# Patient Record
Sex: Male | Born: 1935 | Race: White | Hispanic: No | Marital: Married | Smoking: Former smoker
Health system: Southern US, Community
[De-identification: ages and names within clinical notes are randomized; demographics above are authoritative.]

## PROBLEM LIST (undated history)

## (undated) DIAGNOSIS — Z7901 Long term (current) use of anticoagulants: Secondary | ICD-10-CM

## (undated) DIAGNOSIS — Z86010 Personal history of colonic polyps: Secondary | ICD-10-CM

## (undated) DIAGNOSIS — Z8679 Personal history of other diseases of the circulatory system: Secondary | ICD-10-CM

## (undated) DIAGNOSIS — J322 Chronic ethmoidal sinusitis: Secondary | ICD-10-CM

## (undated) DIAGNOSIS — I739 Peripheral vascular disease, unspecified: Secondary | ICD-10-CM

## (undated) DIAGNOSIS — K449 Diaphragmatic hernia without obstruction or gangrene: Secondary | ICD-10-CM

## (undated) DIAGNOSIS — M1990 Unspecified osteoarthritis, unspecified site: Secondary | ICD-10-CM

## (undated) DIAGNOSIS — N529 Male erectile dysfunction, unspecified: Secondary | ICD-10-CM

## (undated) DIAGNOSIS — I10 Essential (primary) hypertension: Secondary | ICD-10-CM

## (undated) DIAGNOSIS — I639 Cerebral infarction, unspecified: Secondary | ICD-10-CM

## (undated) DIAGNOSIS — T7840XA Allergy, unspecified, initial encounter: Secondary | ICD-10-CM

## (undated) DIAGNOSIS — D689 Coagulation defect, unspecified: Secondary | ICD-10-CM

## (undated) DIAGNOSIS — K219 Gastro-esophageal reflux disease without esophagitis: Secondary | ICD-10-CM

## (undated) DIAGNOSIS — G459 Transient cerebral ischemic attack, unspecified: Secondary | ICD-10-CM

## (undated) DIAGNOSIS — D759 Disease of blood and blood-forming organs, unspecified: Secondary | ICD-10-CM

## (undated) DIAGNOSIS — E785 Hyperlipidemia, unspecified: Secondary | ICD-10-CM

## (undated) DIAGNOSIS — N400 Enlarged prostate without lower urinary tract symptoms: Secondary | ICD-10-CM

## (undated) DIAGNOSIS — J320 Chronic maxillary sinusitis: Secondary | ICD-10-CM

## (undated) DIAGNOSIS — J342 Deviated nasal septum: Secondary | ICD-10-CM

## (undated) DIAGNOSIS — N200 Calculus of kidney: Secondary | ICD-10-CM

## (undated) DIAGNOSIS — D6862 Lupus anticoagulant syndrome: Secondary | ICD-10-CM

## (undated) HISTORY — DX: Deviated nasal septum: J34.2

## (undated) HISTORY — DX: Disease of blood and blood-forming organs, unspecified: D75.9

## (undated) HISTORY — DX: Hyperlipidemia, unspecified: E78.5

## (undated) HISTORY — DX: Allergy, unspecified, initial encounter: T78.40XA

## (undated) HISTORY — DX: Male erectile dysfunction, unspecified: N52.9

## (undated) HISTORY — PX: LITHOTRIPSY: SUR834

## (undated) HISTORY — DX: Unspecified osteoarthritis, unspecified site: M19.90

## (undated) HISTORY — DX: Peripheral vascular disease, unspecified: I73.9

## (undated) HISTORY — DX: Cerebral infarction, unspecified: I63.9

## (undated) HISTORY — PX: NASAL SINUS SURGERY: SHX719

## (undated) HISTORY — DX: Calculus of kidney: N20.0

## (undated) HISTORY — DX: Chronic maxillary sinusitis: J32.0

## (undated) HISTORY — PX: COLONOSCOPY: SHX174

## (undated) HISTORY — DX: Lupus anticoagulant syndrome: D68.62

## (undated) HISTORY — DX: Coagulation defect, unspecified: D68.9

## (undated) HISTORY — DX: Gastro-esophageal reflux disease without esophagitis: K21.9

## (undated) HISTORY — DX: Transient cerebral ischemic attack, unspecified: G45.9

## (undated) HISTORY — DX: Personal history of colonic polyps: Z86.010

## (undated) HISTORY — PX: ROTATOR CUFF REPAIR: SHX139

## (undated) HISTORY — DX: Long term (current) use of anticoagulants: Z79.01

## (undated) HISTORY — DX: Benign prostatic hyperplasia without lower urinary tract symptoms: N40.0

## (undated) HISTORY — DX: Diaphragmatic hernia without obstruction or gangrene: K44.9

## (undated) HISTORY — DX: Chronic ethmoidal sinusitis: J32.2

## (undated) HISTORY — PX: OTHER SURGICAL HISTORY: SHX169

## (undated) HISTORY — DX: Personal history of other diseases of the circulatory system: Z86.79

## (undated) HISTORY — DX: Essential (primary) hypertension: I10

---

## 1978-08-28 HISTORY — PX: LUNG BIOPSY: SHX232

## 1999-01-19 ENCOUNTER — Ambulatory Visit: Admission: RE | Admit: 1999-01-19 | Discharge: 1999-01-19 | Payer: Self-pay | Admitting: Urology

## 1999-01-27 ENCOUNTER — Encounter: Payer: Self-pay | Admitting: Urology

## 1999-01-27 ENCOUNTER — Ambulatory Visit (HOSPITAL_COMMUNITY): Admission: RE | Admit: 1999-01-27 | Discharge: 1999-01-27 | Payer: Self-pay | Admitting: Urology

## 2000-10-14 ENCOUNTER — Encounter: Payer: Self-pay | Admitting: Emergency Medicine

## 2000-10-14 ENCOUNTER — Emergency Department (HOSPITAL_COMMUNITY): Admission: EM | Admit: 2000-10-14 | Discharge: 2000-10-14 | Payer: Self-pay | Admitting: Emergency Medicine

## 2000-10-18 ENCOUNTER — Ambulatory Visit (HOSPITAL_COMMUNITY): Admission: RE | Admit: 2000-10-18 | Discharge: 2000-10-18 | Payer: Self-pay | Admitting: Urology

## 2000-10-18 ENCOUNTER — Encounter: Payer: Self-pay | Admitting: Urology

## 2001-02-12 ENCOUNTER — Encounter (INDEPENDENT_AMBULATORY_CARE_PROVIDER_SITE_OTHER): Payer: Self-pay

## 2001-02-12 ENCOUNTER — Other Ambulatory Visit: Admission: RE | Admit: 2001-02-12 | Discharge: 2001-02-12 | Payer: Self-pay | Admitting: Internal Medicine

## 2001-05-09 ENCOUNTER — Emergency Department (HOSPITAL_COMMUNITY): Admission: EM | Admit: 2001-05-09 | Discharge: 2001-05-09 | Payer: Self-pay | Admitting: Emergency Medicine

## 2001-05-09 ENCOUNTER — Ambulatory Visit (HOSPITAL_COMMUNITY): Admission: RE | Admit: 2001-05-09 | Discharge: 2001-05-09 | Payer: Self-pay | Admitting: Orthopedic Surgery

## 2001-09-27 ENCOUNTER — Encounter: Payer: Self-pay | Admitting: Internal Medicine

## 2001-09-27 ENCOUNTER — Encounter: Admission: RE | Admit: 2001-09-27 | Discharge: 2001-09-27 | Payer: Self-pay | Admitting: Internal Medicine

## 2001-10-15 ENCOUNTER — Emergency Department (HOSPITAL_COMMUNITY): Admission: EM | Admit: 2001-10-15 | Discharge: 2001-10-15 | Payer: Self-pay

## 2001-10-15 ENCOUNTER — Encounter: Payer: Self-pay | Admitting: Internal Medicine

## 2001-10-16 ENCOUNTER — Encounter: Payer: Self-pay | Admitting: Internal Medicine

## 2001-10-16 ENCOUNTER — Encounter: Admission: RE | Admit: 2001-10-16 | Discharge: 2001-10-16 | Payer: Self-pay | Admitting: Internal Medicine

## 2002-01-27 ENCOUNTER — Ambulatory Visit (HOSPITAL_BASED_OUTPATIENT_CLINIC_OR_DEPARTMENT_OTHER): Admission: RE | Admit: 2002-01-27 | Discharge: 2002-01-27 | Payer: Self-pay | Admitting: Otolaryngology

## 2002-01-27 ENCOUNTER — Encounter (INDEPENDENT_AMBULATORY_CARE_PROVIDER_SITE_OTHER): Payer: Self-pay | Admitting: *Deleted

## 2002-06-04 ENCOUNTER — Ambulatory Visit (HOSPITAL_COMMUNITY): Admission: RE | Admit: 2002-06-04 | Discharge: 2002-06-04 | Payer: Self-pay | Admitting: Internal Medicine

## 2002-06-04 ENCOUNTER — Encounter: Payer: Self-pay | Admitting: Internal Medicine

## 2003-06-15 ENCOUNTER — Inpatient Hospital Stay (HOSPITAL_COMMUNITY): Admission: EM | Admit: 2003-06-15 | Discharge: 2003-06-19 | Payer: Self-pay | Admitting: Emergency Medicine

## 2003-06-15 ENCOUNTER — Encounter: Payer: Self-pay | Admitting: Emergency Medicine

## 2003-06-15 ENCOUNTER — Encounter: Payer: Self-pay | Admitting: Internal Medicine

## 2003-06-16 ENCOUNTER — Encounter: Payer: Self-pay | Admitting: Cardiology

## 2003-07-28 ENCOUNTER — Ambulatory Visit (HOSPITAL_COMMUNITY): Admission: RE | Admit: 2003-07-28 | Discharge: 2003-07-28 | Payer: Self-pay | Admitting: Cardiovascular Disease

## 2004-02-27 ENCOUNTER — Encounter: Admission: RE | Admit: 2004-02-27 | Discharge: 2004-02-27 | Payer: Self-pay | Admitting: Internal Medicine

## 2004-07-15 ENCOUNTER — Ambulatory Visit: Payer: Self-pay | Admitting: Internal Medicine

## 2004-08-08 ENCOUNTER — Ambulatory Visit: Payer: Self-pay | Admitting: Internal Medicine

## 2004-08-26 ENCOUNTER — Ambulatory Visit: Payer: Self-pay | Admitting: Internal Medicine

## 2004-08-30 ENCOUNTER — Ambulatory Visit: Payer: Self-pay | Admitting: Internal Medicine

## 2004-09-09 ENCOUNTER — Ambulatory Visit: Payer: Self-pay | Admitting: Internal Medicine

## 2004-09-09 ENCOUNTER — Ambulatory Visit: Payer: Self-pay | Admitting: Cardiovascular Disease

## 2004-10-03 ENCOUNTER — Ambulatory Visit: Payer: Self-pay | Admitting: Internal Medicine

## 2004-10-07 ENCOUNTER — Ambulatory Visit: Payer: Self-pay | Admitting: Cardiology

## 2004-10-14 ENCOUNTER — Ambulatory Visit: Payer: Self-pay | Admitting: Internal Medicine

## 2004-10-21 ENCOUNTER — Ambulatory Visit: Payer: Self-pay | Admitting: Cardiology

## 2004-10-31 ENCOUNTER — Ambulatory Visit: Payer: Self-pay | Admitting: Cardiology

## 2004-11-14 ENCOUNTER — Ambulatory Visit: Payer: Self-pay | Admitting: Internal Medicine

## 2004-11-28 ENCOUNTER — Ambulatory Visit: Payer: Self-pay | Admitting: Cardiology

## 2004-12-08 ENCOUNTER — Ambulatory Visit: Payer: Self-pay | Admitting: Cardiology

## 2004-12-22 ENCOUNTER — Ambulatory Visit: Payer: Self-pay | Admitting: Cardiology

## 2004-12-26 ENCOUNTER — Ambulatory Visit: Payer: Self-pay | Admitting: Internal Medicine

## 2005-01-05 ENCOUNTER — Ambulatory Visit: Payer: Self-pay | Admitting: Internal Medicine

## 2005-01-19 ENCOUNTER — Ambulatory Visit: Payer: Self-pay | Admitting: Cardiology

## 2005-01-27 ENCOUNTER — Ambulatory Visit: Payer: Self-pay | Admitting: Internal Medicine

## 2005-02-06 ENCOUNTER — Ambulatory Visit: Payer: Self-pay | Admitting: Internal Medicine

## 2005-02-20 ENCOUNTER — Ambulatory Visit: Payer: Self-pay | Admitting: Internal Medicine

## 2005-02-21 ENCOUNTER — Encounter: Admission: RE | Admit: 2005-02-21 | Discharge: 2005-02-21 | Payer: Self-pay | Admitting: Internal Medicine

## 2005-02-27 ENCOUNTER — Ambulatory Visit: Payer: Self-pay | Admitting: Internal Medicine

## 2005-03-06 ENCOUNTER — Ambulatory Visit: Payer: Self-pay | Admitting: Internal Medicine

## 2005-03-13 ENCOUNTER — Ambulatory Visit: Payer: Self-pay | Admitting: Internal Medicine

## 2005-03-20 ENCOUNTER — Ambulatory Visit: Payer: Self-pay | Admitting: Internal Medicine

## 2005-04-03 ENCOUNTER — Ambulatory Visit: Payer: Self-pay | Admitting: Cardiology

## 2005-04-17 ENCOUNTER — Ambulatory Visit: Payer: Self-pay | Admitting: Cardiology

## 2005-04-28 ENCOUNTER — Ambulatory Visit: Payer: Self-pay | Admitting: Cardiology

## 2005-05-15 ENCOUNTER — Ambulatory Visit: Payer: Self-pay | Admitting: Cardiology

## 2005-05-15 ENCOUNTER — Ambulatory Visit: Payer: Self-pay | Admitting: Internal Medicine

## 2005-05-29 ENCOUNTER — Ambulatory Visit: Payer: Self-pay | Admitting: Cardiology

## 2005-05-30 ENCOUNTER — Ambulatory Visit: Payer: Self-pay | Admitting: Internal Medicine

## 2005-06-12 ENCOUNTER — Ambulatory Visit: Payer: Self-pay | Admitting: Cardiology

## 2005-06-15 ENCOUNTER — Ambulatory Visit: Payer: Self-pay | Admitting: Internal Medicine

## 2005-06-26 ENCOUNTER — Ambulatory Visit: Payer: Self-pay | Admitting: Cardiology

## 2005-07-10 ENCOUNTER — Ambulatory Visit: Payer: Self-pay | Admitting: Cardiology

## 2005-07-14 ENCOUNTER — Ambulatory Visit: Payer: Self-pay | Admitting: Internal Medicine

## 2005-07-24 ENCOUNTER — Ambulatory Visit: Payer: Self-pay | Admitting: Cardiology

## 2005-07-31 ENCOUNTER — Ambulatory Visit: Payer: Self-pay | Admitting: Cardiology

## 2005-08-07 ENCOUNTER — Ambulatory Visit: Payer: Self-pay | Admitting: Cardiology

## 2005-08-14 ENCOUNTER — Ambulatory Visit: Payer: Self-pay | Admitting: Cardiology

## 2005-08-14 ENCOUNTER — Ambulatory Visit: Payer: Self-pay | Admitting: Internal Medicine

## 2005-08-22 ENCOUNTER — Ambulatory Visit: Payer: Self-pay | Admitting: Internal Medicine

## 2005-08-29 ENCOUNTER — Ambulatory Visit: Payer: Self-pay | Admitting: *Deleted

## 2005-09-11 ENCOUNTER — Ambulatory Visit: Payer: Self-pay | Admitting: Internal Medicine

## 2005-10-02 ENCOUNTER — Ambulatory Visit: Payer: Self-pay | Admitting: Cardiology

## 2005-10-23 ENCOUNTER — Ambulatory Visit: Payer: Self-pay | Admitting: Internal Medicine

## 2005-11-06 ENCOUNTER — Ambulatory Visit: Payer: Self-pay | Admitting: Internal Medicine

## 2005-11-20 ENCOUNTER — Ambulatory Visit: Payer: Self-pay | Admitting: Cardiology

## 2005-11-28 ENCOUNTER — Ambulatory Visit: Payer: Self-pay | Admitting: Internal Medicine

## 2005-12-04 ENCOUNTER — Ambulatory Visit: Payer: Self-pay | Admitting: Internal Medicine

## 2005-12-18 ENCOUNTER — Ambulatory Visit: Payer: Self-pay | Admitting: Cardiology

## 2005-12-27 ENCOUNTER — Ambulatory Visit: Payer: Self-pay | Admitting: Internal Medicine

## 2006-01-01 ENCOUNTER — Ambulatory Visit: Payer: Self-pay | Admitting: Cardiology

## 2006-01-15 ENCOUNTER — Ambulatory Visit: Payer: Self-pay | Admitting: Cardiology

## 2006-01-29 ENCOUNTER — Ambulatory Visit: Payer: Self-pay | Admitting: Internal Medicine

## 2006-02-19 ENCOUNTER — Ambulatory Visit: Payer: Self-pay | Admitting: Cardiology

## 2006-02-20 ENCOUNTER — Ambulatory Visit: Payer: Self-pay | Admitting: Internal Medicine

## 2006-03-05 ENCOUNTER — Ambulatory Visit: Payer: Self-pay | Admitting: Cardiology

## 2006-03-26 ENCOUNTER — Ambulatory Visit: Payer: Self-pay | Admitting: Cardiology

## 2006-04-02 ENCOUNTER — Ambulatory Visit: Payer: Self-pay | Admitting: Internal Medicine

## 2006-04-06 ENCOUNTER — Ambulatory Visit: Payer: Self-pay | Admitting: Internal Medicine

## 2006-04-17 ENCOUNTER — Ambulatory Visit: Payer: Self-pay | Admitting: Cardiology

## 2006-04-20 ENCOUNTER — Encounter: Payer: Self-pay | Admitting: Internal Medicine

## 2006-04-20 ENCOUNTER — Ambulatory Visit: Payer: Self-pay | Admitting: Internal Medicine

## 2006-04-20 LAB — HM COLONOSCOPY: HM Colonoscopy: ABNORMAL

## 2006-05-04 ENCOUNTER — Ambulatory Visit: Payer: Self-pay | Admitting: Cardiology

## 2006-05-14 ENCOUNTER — Ambulatory Visit: Payer: Self-pay | Admitting: Cardiology

## 2006-05-28 ENCOUNTER — Ambulatory Visit: Payer: Self-pay | Admitting: Internal Medicine

## 2006-06-11 ENCOUNTER — Ambulatory Visit: Payer: Self-pay | Admitting: Cardiology

## 2006-06-15 ENCOUNTER — Ambulatory Visit: Payer: Self-pay | Admitting: Internal Medicine

## 2006-07-02 ENCOUNTER — Ambulatory Visit: Payer: Self-pay | Admitting: Internal Medicine

## 2006-07-09 ENCOUNTER — Ambulatory Visit: Payer: Self-pay | Admitting: Cardiology

## 2006-07-12 ENCOUNTER — Ambulatory Visit: Payer: Self-pay | Admitting: Internal Medicine

## 2006-07-23 ENCOUNTER — Ambulatory Visit: Payer: Self-pay | Admitting: Cardiology

## 2006-08-13 ENCOUNTER — Ambulatory Visit: Payer: Self-pay | Admitting: Cardiology

## 2006-09-03 ENCOUNTER — Ambulatory Visit: Payer: Self-pay | Admitting: Cardiology

## 2006-09-06 ENCOUNTER — Ambulatory Visit: Payer: Self-pay | Admitting: Internal Medicine

## 2006-09-17 ENCOUNTER — Ambulatory Visit: Payer: Self-pay | Admitting: Cardiology

## 2006-09-21 ENCOUNTER — Ambulatory Visit: Payer: Self-pay | Admitting: Internal Medicine

## 2006-10-01 ENCOUNTER — Ambulatory Visit: Payer: Self-pay | Admitting: Internal Medicine

## 2006-10-08 ENCOUNTER — Ambulatory Visit: Payer: Self-pay | Admitting: Cardiology

## 2006-10-12 ENCOUNTER — Ambulatory Visit: Payer: Self-pay | Admitting: Internal Medicine

## 2006-10-29 ENCOUNTER — Ambulatory Visit: Payer: Self-pay | Admitting: Cardiology

## 2006-11-19 ENCOUNTER — Ambulatory Visit: Payer: Self-pay | Admitting: Internal Medicine

## 2006-12-03 ENCOUNTER — Ambulatory Visit: Payer: Self-pay | Admitting: Internal Medicine

## 2006-12-03 LAB — CONVERTED CEMR LAB
Alkaline Phosphatase: 75 units/L (ref 39–117)
Basophils Absolute: 0 10*3/uL (ref 0.0–0.1)
Basophils Relative: 0.8 % (ref 0.0–1.0)
Bilirubin, Direct: 0.1 mg/dL (ref 0.0–0.3)
Chloride: 109 meq/L (ref 96–112)
Eosinophils Absolute: 0.2 10*3/uL (ref 0.0–0.6)
Eosinophils Relative: 3.4 % (ref 0.0–5.0)
GFR calc Af Amer: 77 mL/min
GFR calc non Af Amer: 64 mL/min
Hemoglobin: 14 g/dL (ref 13.0–17.0)
Monocytes Absolute: 0.4 10*3/uL (ref 0.2–0.7)
Neutrophils Relative %: 72.8 % (ref 43.0–77.0)
Nitrite: NEGATIVE
PSA: 1.49 ng/mL (ref 0.10–4.00)
Platelets: 122 10*3/uL — ABNORMAL LOW (ref 150–400)
RBC: 4.67 M/uL (ref 4.22–5.81)
RDW: 14 % (ref 11.5–14.6)
TSH: 1.97 microintl units/mL (ref 0.35–5.50)
Total CHOL/HDL Ratio: 2.9
Urobilinogen, UA: 0.2 (ref 0.0–1.0)
VLDL: 11 mg/dL (ref 0–40)
Vit D, 1,25-Dihydroxy: 19 — ABNORMAL LOW (ref 20–57)
pH: 6 (ref 5.0–8.0)

## 2006-12-10 ENCOUNTER — Ambulatory Visit: Payer: Self-pay | Admitting: Cardiology

## 2007-01-07 ENCOUNTER — Ambulatory Visit: Payer: Self-pay | Admitting: Internal Medicine

## 2007-02-01 ENCOUNTER — Ambulatory Visit: Payer: Self-pay | Admitting: Cardiology

## 2007-02-08 ENCOUNTER — Ambulatory Visit: Payer: Self-pay | Admitting: Endocrinology

## 2007-02-15 ENCOUNTER — Ambulatory Visit: Payer: Self-pay | Admitting: Cardiology

## 2007-02-28 ENCOUNTER — Ambulatory Visit: Payer: Self-pay | Admitting: Internal Medicine

## 2007-03-15 ENCOUNTER — Ambulatory Visit: Payer: Self-pay | Admitting: Cardiovascular Disease

## 2007-03-15 DIAGNOSIS — M1990 Unspecified osteoarthritis, unspecified site: Secondary | ICD-10-CM | POA: Insufficient documentation

## 2007-03-15 DIAGNOSIS — D6859 Other primary thrombophilia: Secondary | ICD-10-CM | POA: Insufficient documentation

## 2007-03-15 DIAGNOSIS — K219 Gastro-esophageal reflux disease without esophagitis: Secondary | ICD-10-CM

## 2007-03-15 DIAGNOSIS — I10 Essential (primary) hypertension: Secondary | ICD-10-CM

## 2007-03-15 DIAGNOSIS — R0602 Shortness of breath: Secondary | ICD-10-CM

## 2007-03-15 HISTORY — DX: Essential (primary) hypertension: I10

## 2007-03-15 HISTORY — DX: Gastro-esophageal reflux disease without esophagitis: K21.9

## 2007-03-15 HISTORY — DX: Unspecified osteoarthritis, unspecified site: M19.90

## 2007-03-22 ENCOUNTER — Ambulatory Visit: Payer: Self-pay | Admitting: Internal Medicine

## 2007-03-22 LAB — CONVERTED CEMR LAB
ALT: 13 units/L (ref 0–53)
AST: 21 units/L (ref 0–37)
Albumin: 3.6 g/dL (ref 3.5–5.2)
Alkaline Phosphatase: 71 units/L (ref 39–117)
Basophils Absolute: 0 10*3/uL (ref 0.0–0.1)
Bilirubin, Direct: 0.1 mg/dL (ref 0.0–0.3)
Calcium: 8.9 mg/dL (ref 8.4–10.5)
GFR calc Af Amer: 77 mL/min
HCT: 36.9 % — ABNORMAL LOW (ref 39.0–52.0)
Hemoglobin: 12.8 g/dL — ABNORMAL LOW (ref 13.0–17.0)
Lymphocytes Relative: 24.9 % (ref 12.0–46.0)
Monocytes Relative: 7.6 % (ref 3.0–11.0)
Neutro Abs: 3.5 10*3/uL (ref 1.4–7.7)
Nitrite: NEGATIVE
Platelets: 117 10*3/uL — ABNORMAL LOW (ref 150–400)
Potassium: 4 meq/L (ref 3.5–5.1)
RBC: 4.13 M/uL — ABNORMAL LOW (ref 4.22–5.81)
RDW: 14 % (ref 11.5–14.6)
Sed Rate: 28 mm/hr — ABNORMAL HIGH (ref 0–20)
Sodium: 142 meq/L (ref 135–145)
Total Protein, Urine: NEGATIVE mg/dL
Total Protein: 6.6 g/dL (ref 6.0–8.3)
pH: 6 (ref 5.0–8.0)

## 2007-03-29 ENCOUNTER — Ambulatory Visit: Payer: Self-pay | Admitting: Internal Medicine

## 2007-03-29 ENCOUNTER — Ambulatory Visit: Payer: Self-pay | Admitting: Cardiology

## 2007-04-08 ENCOUNTER — Ambulatory Visit: Payer: Self-pay | Admitting: Cardiovascular Disease

## 2007-04-12 ENCOUNTER — Ambulatory Visit: Payer: Self-pay | Admitting: Internal Medicine

## 2007-04-25 ENCOUNTER — Ambulatory Visit: Payer: Self-pay | Admitting: Cardiology

## 2007-05-01 ENCOUNTER — Ambulatory Visit: Payer: Self-pay | Admitting: Internal Medicine

## 2007-05-13 ENCOUNTER — Ambulatory Visit: Payer: Self-pay | Admitting: Internal Medicine

## 2007-05-17 ENCOUNTER — Ambulatory Visit: Payer: Self-pay | Admitting: Cardiovascular Disease

## 2007-05-17 DIAGNOSIS — Z8679 Personal history of other diseases of the circulatory system: Secondary | ICD-10-CM

## 2007-05-17 HISTORY — DX: Personal history of other diseases of the circulatory system: Z86.79

## 2007-05-27 ENCOUNTER — Ambulatory Visit: Payer: Self-pay | Admitting: Internal Medicine

## 2007-05-31 ENCOUNTER — Ambulatory Visit: Payer: Self-pay

## 2007-06-07 ENCOUNTER — Ambulatory Visit: Payer: Self-pay | Admitting: Internal Medicine

## 2007-07-05 ENCOUNTER — Ambulatory Visit: Payer: Self-pay | Admitting: Cardiology

## 2007-07-23 ENCOUNTER — Ambulatory Visit: Payer: Self-pay | Admitting: Internal Medicine

## 2007-08-12 ENCOUNTER — Ambulatory Visit: Payer: Self-pay | Admitting: Internal Medicine

## 2007-08-12 ENCOUNTER — Ambulatory Visit: Payer: Self-pay | Admitting: Cardiovascular Disease

## 2007-08-12 DIAGNOSIS — M545 Low back pain, unspecified: Secondary | ICD-10-CM | POA: Insufficient documentation

## 2007-08-12 DIAGNOSIS — L570 Actinic keratosis: Secondary | ICD-10-CM

## 2007-08-12 DIAGNOSIS — N400 Enlarged prostate without lower urinary tract symptoms: Secondary | ICD-10-CM

## 2007-08-12 DIAGNOSIS — M79609 Pain in unspecified limb: Secondary | ICD-10-CM

## 2007-08-12 HISTORY — DX: Benign prostatic hyperplasia without lower urinary tract symptoms: N40.0

## 2007-08-27 ENCOUNTER — Ambulatory Visit: Payer: Self-pay | Admitting: Cardiology

## 2007-09-09 ENCOUNTER — Ambulatory Visit: Payer: Self-pay | Admitting: Cardiology

## 2007-09-23 ENCOUNTER — Ambulatory Visit: Payer: Self-pay | Admitting: Cardiology

## 2007-10-01 ENCOUNTER — Ambulatory Visit: Payer: Self-pay | Admitting: Cardiology

## 2007-10-01 ENCOUNTER — Inpatient Hospital Stay (HOSPITAL_COMMUNITY): Admission: EM | Admit: 2007-10-01 | Discharge: 2007-10-02 | Payer: Self-pay | Admitting: Emergency Medicine

## 2007-10-01 ENCOUNTER — Ambulatory Visit: Payer: Self-pay | Admitting: Internal Medicine

## 2007-10-02 ENCOUNTER — Encounter: Payer: Self-pay | Admitting: Internal Medicine

## 2007-10-03 ENCOUNTER — Ambulatory Visit: Payer: Self-pay | Admitting: Cardiology

## 2007-10-11 ENCOUNTER — Ambulatory Visit: Payer: Self-pay | Admitting: Cardiology

## 2007-10-16 ENCOUNTER — Ambulatory Visit: Payer: Self-pay | Admitting: Internal Medicine

## 2007-10-16 ENCOUNTER — Telehealth: Payer: Self-pay | Admitting: Internal Medicine

## 2007-10-16 DIAGNOSIS — I739 Peripheral vascular disease, unspecified: Secondary | ICD-10-CM

## 2007-10-16 DIAGNOSIS — IMO0001 Reserved for inherently not codable concepts without codable children: Secondary | ICD-10-CM

## 2007-10-16 DIAGNOSIS — R209 Unspecified disturbances of skin sensation: Secondary | ICD-10-CM | POA: Insufficient documentation

## 2007-10-16 HISTORY — DX: Peripheral vascular disease, unspecified: I73.9

## 2007-10-25 ENCOUNTER — Ambulatory Visit: Payer: Self-pay | Admitting: Internal Medicine

## 2007-10-25 ENCOUNTER — Ambulatory Visit: Payer: Self-pay | Admitting: Cardiology

## 2007-10-25 DIAGNOSIS — M722 Plantar fascial fibromatosis: Secondary | ICD-10-CM

## 2007-10-25 DIAGNOSIS — Z860100 Personal history of colon polyps, unspecified: Secondary | ICD-10-CM

## 2007-10-25 DIAGNOSIS — E785 Hyperlipidemia, unspecified: Secondary | ICD-10-CM

## 2007-10-25 DIAGNOSIS — K921 Melena: Secondary | ICD-10-CM

## 2007-10-25 DIAGNOSIS — J309 Allergic rhinitis, unspecified: Secondary | ICD-10-CM | POA: Insufficient documentation

## 2007-10-25 HISTORY — DX: Personal history of colon polyps, unspecified: Z86.0100

## 2007-10-25 HISTORY — DX: Hyperlipidemia, unspecified: E78.5

## 2007-10-25 HISTORY — DX: Personal history of colonic polyps: Z86.010

## 2007-10-31 ENCOUNTER — Telehealth: Payer: Self-pay | Admitting: Family Medicine

## 2007-11-01 ENCOUNTER — Ambulatory Visit: Payer: Self-pay | Admitting: Internal Medicine

## 2007-11-01 DIAGNOSIS — R51 Headache: Secondary | ICD-10-CM

## 2007-11-01 DIAGNOSIS — R519 Headache, unspecified: Secondary | ICD-10-CM | POA: Insufficient documentation

## 2007-11-01 DIAGNOSIS — R509 Fever, unspecified: Secondary | ICD-10-CM

## 2007-11-01 LAB — CONVERTED CEMR LAB
HCT: 40.1 % (ref 39.0–52.0)
Lymphocytes Relative: 14.3 % (ref 12.0–46.0)
MCHC: 32.6 g/dL (ref 30.0–36.0)
Monocytes Relative: 7.7 % (ref 3.0–11.0)
Neutro Abs: 4.5 10*3/uL (ref 1.4–7.7)
Platelets: 132 10*3/uL — ABNORMAL LOW (ref 150–400)

## 2007-11-04 ENCOUNTER — Ambulatory Visit: Payer: Self-pay | Admitting: Cardiology

## 2007-11-06 ENCOUNTER — Telehealth: Payer: Self-pay | Admitting: Internal Medicine

## 2007-11-08 ENCOUNTER — Ambulatory Visit: Payer: Self-pay | Admitting: Internal Medicine

## 2007-11-08 ENCOUNTER — Telehealth: Payer: Self-pay | Admitting: Internal Medicine

## 2007-11-08 DIAGNOSIS — R259 Unspecified abnormal involuntary movements: Secondary | ICD-10-CM | POA: Insufficient documentation

## 2007-11-11 ENCOUNTER — Ambulatory Visit: Payer: Self-pay | Admitting: Internal Medicine

## 2007-11-22 ENCOUNTER — Telehealth: Payer: Self-pay | Admitting: Internal Medicine

## 2007-11-22 DIAGNOSIS — R05 Cough: Secondary | ICD-10-CM

## 2007-11-25 ENCOUNTER — Encounter (INDEPENDENT_AMBULATORY_CARE_PROVIDER_SITE_OTHER): Payer: Self-pay | Admitting: *Deleted

## 2007-11-25 ENCOUNTER — Ambulatory Visit: Payer: Self-pay | Admitting: Cardiology

## 2007-11-25 ENCOUNTER — Telehealth: Payer: Self-pay | Admitting: Internal Medicine

## 2007-12-04 ENCOUNTER — Ambulatory Visit: Payer: Self-pay | Admitting: Internal Medicine

## 2007-12-04 DIAGNOSIS — J45909 Unspecified asthma, uncomplicated: Secondary | ICD-10-CM

## 2007-12-09 ENCOUNTER — Ambulatory Visit: Payer: Self-pay | Admitting: Internal Medicine

## 2007-12-30 ENCOUNTER — Ambulatory Visit: Payer: Self-pay | Admitting: Critical Care Medicine

## 2008-01-01 ENCOUNTER — Telehealth (INDEPENDENT_AMBULATORY_CARE_PROVIDER_SITE_OTHER): Payer: Self-pay | Admitting: *Deleted

## 2008-01-06 ENCOUNTER — Ambulatory Visit: Payer: Self-pay | Admitting: Cardiology

## 2008-01-10 ENCOUNTER — Ambulatory Visit: Payer: Self-pay | Admitting: Critical Care Medicine

## 2008-01-12 ENCOUNTER — Encounter: Payer: Self-pay | Admitting: Critical Care Medicine

## 2008-01-16 ENCOUNTER — Telehealth (INDEPENDENT_AMBULATORY_CARE_PROVIDER_SITE_OTHER): Payer: Self-pay | Admitting: *Deleted

## 2008-01-17 ENCOUNTER — Ambulatory Visit: Payer: Self-pay | Admitting: Internal Medicine

## 2008-01-22 ENCOUNTER — Telehealth: Payer: Self-pay | Admitting: Internal Medicine

## 2008-01-31 ENCOUNTER — Ambulatory Visit: Payer: Self-pay | Admitting: Critical Care Medicine

## 2008-02-03 ENCOUNTER — Ambulatory Visit: Payer: Self-pay | Admitting: Cardiology

## 2008-02-12 ENCOUNTER — Telehealth: Payer: Self-pay | Admitting: Internal Medicine

## 2008-02-18 ENCOUNTER — Ambulatory Visit: Payer: Self-pay | Admitting: Cardiology

## 2008-03-10 ENCOUNTER — Ambulatory Visit: Payer: Self-pay | Admitting: Cardiology

## 2008-03-17 ENCOUNTER — Telehealth (INDEPENDENT_AMBULATORY_CARE_PROVIDER_SITE_OTHER): Payer: Self-pay | Admitting: *Deleted

## 2008-03-17 ENCOUNTER — Emergency Department (HOSPITAL_COMMUNITY): Admission: EM | Admit: 2008-03-17 | Discharge: 2008-03-17 | Payer: Self-pay | Admitting: Emergency Medicine

## 2008-03-19 ENCOUNTER — Ambulatory Visit: Payer: Self-pay | Admitting: Internal Medicine

## 2008-03-19 DIAGNOSIS — G459 Transient cerebral ischemic attack, unspecified: Secondary | ICD-10-CM

## 2008-03-19 HISTORY — DX: Transient cerebral ischemic attack, unspecified: G45.9

## 2008-04-07 ENCOUNTER — Ambulatory Visit: Payer: Self-pay | Admitting: Internal Medicine

## 2008-04-20 ENCOUNTER — Ambulatory Visit: Payer: Self-pay | Admitting: Cardiovascular Disease

## 2008-05-05 ENCOUNTER — Ambulatory Visit: Payer: Self-pay | Admitting: Cardiology

## 2008-05-08 ENCOUNTER — Ambulatory Visit: Payer: Self-pay | Admitting: Internal Medicine

## 2008-05-08 DIAGNOSIS — R079 Chest pain, unspecified: Secondary | ICD-10-CM

## 2008-05-08 DIAGNOSIS — R232 Flushing: Secondary | ICD-10-CM

## 2008-05-11 ENCOUNTER — Ambulatory Visit: Payer: Self-pay | Admitting: Internal Medicine

## 2008-05-19 ENCOUNTER — Ambulatory Visit: Payer: Self-pay | Admitting: Cardiology

## 2008-05-26 ENCOUNTER — Ambulatory Visit: Payer: Self-pay | Admitting: Cardiology

## 2008-06-02 ENCOUNTER — Encounter: Payer: Self-pay | Admitting: Internal Medicine

## 2008-06-09 ENCOUNTER — Ambulatory Visit: Payer: Self-pay | Admitting: Cardiovascular Disease

## 2008-06-12 ENCOUNTER — Ambulatory Visit: Payer: Self-pay | Admitting: Internal Medicine

## 2008-06-15 ENCOUNTER — Ambulatory Visit: Payer: Self-pay | Admitting: Critical Care Medicine

## 2008-06-16 ENCOUNTER — Ambulatory Visit: Payer: Self-pay | Admitting: Internal Medicine

## 2008-06-17 LAB — CONVERTED CEMR LAB
ALT: 21 units/L (ref 0–53)
AST: 22 units/L (ref 0–37)
Albumin: 3.6 g/dL (ref 3.5–5.2)
Alkaline Phosphatase: 60 units/L (ref 39–117)
Basophils Absolute: 0 10*3/uL (ref 0.0–0.1)
CO2: 25 meq/L (ref 19–32)
Creatinine, Ser: 1 mg/dL (ref 0.4–1.5)
Direct LDL: 94.1 mg/dL
Eosinophils Relative: 3.4 % (ref 0.0–5.0)
HCT: 40.6 % (ref 39.0–52.0)
Lymphocytes Relative: 23 % (ref 12.0–46.0)
MCHC: 34.6 g/dL (ref 30.0–36.0)
Monocytes Absolute: 0.4 10*3/uL (ref 0.1–1.0)
Neutrophils Relative %: 66.1 % (ref 43.0–77.0)
Potassium: 4.3 meq/L (ref 3.5–5.1)
RDW: 14.7 % — ABNORMAL HIGH (ref 11.5–14.6)
Sodium: 141 meq/L (ref 135–145)
Total Bilirubin: 0.8 mg/dL (ref 0.3–1.2)
Total CHOL/HDL Ratio: 6.6
Total Protein: 6.6 g/dL (ref 6.0–8.3)
Triglycerides: 306 mg/dL (ref 0–149)
WBC: 5.9 10*3/uL (ref 4.5–10.5)

## 2008-06-22 ENCOUNTER — Ambulatory Visit: Payer: Self-pay | Admitting: Internal Medicine

## 2008-06-26 ENCOUNTER — Telehealth: Payer: Self-pay | Admitting: Internal Medicine

## 2008-06-26 ENCOUNTER — Emergency Department (HOSPITAL_COMMUNITY): Admission: EM | Admit: 2008-06-26 | Discharge: 2008-06-26 | Payer: Self-pay | Admitting: Emergency Medicine

## 2008-06-29 ENCOUNTER — Ambulatory Visit: Payer: Self-pay | Admitting: Internal Medicine

## 2008-06-29 ENCOUNTER — Ambulatory Visit: Payer: Self-pay | Admitting: Cardiology

## 2008-07-20 ENCOUNTER — Ambulatory Visit: Payer: Self-pay | Admitting: Cardiology

## 2008-08-03 ENCOUNTER — Ambulatory Visit: Payer: Self-pay | Admitting: Internal Medicine

## 2008-08-20 ENCOUNTER — Ambulatory Visit: Payer: Self-pay | Admitting: Internal Medicine

## 2008-08-31 ENCOUNTER — Ambulatory Visit: Payer: Self-pay | Admitting: Cardiology

## 2008-09-15 ENCOUNTER — Ambulatory Visit: Payer: Self-pay | Admitting: Internal Medicine

## 2008-09-15 LAB — CONVERTED CEMR LAB
AST: 23 units/L (ref 0–37)
Albumin: 3.5 g/dL (ref 3.5–5.2)
Alkaline Phosphatase: 61 units/L (ref 39–117)
Bilirubin, Direct: 0.1 mg/dL (ref 0.0–0.3)
CO2: 28 meq/L (ref 19–32)
Direct LDL: 110.6 mg/dL
GFR calc Af Amer: 107 mL/min
GFR calc non Af Amer: 89 mL/min
HDL: 28.9 mg/dL — ABNORMAL LOW (ref >39.0)
Total Bilirubin: 0.9 mg/dL (ref 0.3–1.2)
Total Protein: 6.2 g/dL (ref 6.0–8.3)
Triglycerides: 231 mg/dL (ref 0–149)
VLDL: 46 mg/dL — ABNORMAL HIGH (ref 0–40)

## 2008-09-21 ENCOUNTER — Ambulatory Visit: Payer: Self-pay | Admitting: Internal Medicine

## 2008-09-28 ENCOUNTER — Ambulatory Visit: Payer: Self-pay | Admitting: Cardiology

## 2008-10-26 ENCOUNTER — Ambulatory Visit: Payer: Self-pay | Admitting: Cardiovascular Disease

## 2008-11-18 ENCOUNTER — Ambulatory Visit: Payer: Self-pay | Admitting: Internal Medicine

## 2008-12-14 ENCOUNTER — Ambulatory Visit: Payer: Self-pay | Admitting: Cardiology

## 2008-12-15 ENCOUNTER — Ambulatory Visit: Payer: Self-pay | Admitting: Internal Medicine

## 2008-12-15 LAB — CONVERTED CEMR LAB
AST: 20 units/L (ref 0–37)
Albumin: 3.5 g/dL (ref 3.5–5.2)
BUN: 18 mg/dL (ref 6–23)
Chloride: 109 meq/L (ref 96–112)
Creatinine, Ser: 1 mg/dL (ref 0.4–1.5)
LDL Cholesterol: 124 mg/dL — ABNORMAL HIGH (ref 0–99)
Sodium: 142 meq/L (ref 135–145)
TSH: 2.24 microintl units/mL (ref 0.35–5.50)
Triglycerides: 149 mg/dL (ref 0.0–149.0)
VLDL: 29.8 mg/dL (ref 0.0–40.0)

## 2008-12-21 ENCOUNTER — Ambulatory Visit: Payer: Self-pay | Admitting: Internal Medicine

## 2008-12-21 DIAGNOSIS — K5900 Constipation, unspecified: Secondary | ICD-10-CM | POA: Insufficient documentation

## 2009-01-11 ENCOUNTER — Ambulatory Visit: Payer: Self-pay | Admitting: Internal Medicine

## 2009-01-21 ENCOUNTER — Ambulatory Visit: Payer: Self-pay | Admitting: Internal Medicine

## 2009-01-21 LAB — CONVERTED CEMR LAB: Protime: 19

## 2009-01-27 ENCOUNTER — Encounter: Payer: Self-pay | Admitting: *Deleted

## 2009-02-15 ENCOUNTER — Ambulatory Visit: Payer: Self-pay | Admitting: Internal Medicine

## 2009-02-15 LAB — CONVERTED CEMR LAB
POC INR: 2.5
Protime: 19.2

## 2009-03-03 ENCOUNTER — Encounter: Payer: Self-pay | Admitting: *Deleted

## 2009-03-15 ENCOUNTER — Ambulatory Visit: Payer: Self-pay | Admitting: Cardiovascular Disease

## 2009-03-15 LAB — CONVERTED CEMR LAB: POC INR: 3.3

## 2009-04-05 ENCOUNTER — Ambulatory Visit: Payer: Self-pay | Admitting: Internal Medicine

## 2009-04-19 ENCOUNTER — Ambulatory Visit: Payer: Self-pay | Admitting: Cardiology

## 2009-04-20 ENCOUNTER — Ambulatory Visit: Payer: Self-pay | Admitting: Internal Medicine

## 2009-04-20 LAB — CONVERTED CEMR LAB
CO2: 28 meq/L (ref 19–32)
Chloride: 109 meq/L (ref 96–112)
GFR calc non Af Amer: 78.35 mL/min (ref >60)
Potassium: 3.9 meq/L (ref 3.5–5.1)

## 2009-04-26 ENCOUNTER — Ambulatory Visit: Payer: Self-pay | Admitting: Internal Medicine

## 2009-05-10 ENCOUNTER — Ambulatory Visit: Payer: Self-pay | Admitting: Internal Medicine

## 2009-05-26 ENCOUNTER — Ambulatory Visit: Payer: Self-pay | Admitting: Internal Medicine

## 2009-05-31 ENCOUNTER — Ambulatory Visit: Payer: Self-pay | Admitting: Cardiovascular Disease

## 2009-05-31 LAB — CONVERTED CEMR LAB: POC INR: 2.4

## 2009-06-21 ENCOUNTER — Ambulatory Visit: Payer: Self-pay | Admitting: Cardiology

## 2009-06-22 ENCOUNTER — Encounter: Payer: Self-pay | Admitting: Internal Medicine

## 2009-06-30 ENCOUNTER — Telehealth: Payer: Self-pay | Admitting: Internal Medicine

## 2009-07-12 ENCOUNTER — Ambulatory Visit: Payer: Self-pay | Admitting: Internal Medicine

## 2009-07-12 LAB — CONVERTED CEMR LAB: POC INR: 2.2

## 2009-07-27 ENCOUNTER — Ambulatory Visit: Payer: Self-pay | Admitting: Internal Medicine

## 2009-07-27 DIAGNOSIS — N529 Male erectile dysfunction, unspecified: Secondary | ICD-10-CM

## 2009-07-27 DIAGNOSIS — Z87891 Personal history of nicotine dependence: Secondary | ICD-10-CM

## 2009-07-27 HISTORY — DX: Male erectile dysfunction, unspecified: N52.9

## 2009-08-02 ENCOUNTER — Ambulatory Visit: Payer: Self-pay | Admitting: Cardiology

## 2009-08-02 ENCOUNTER — Encounter (INDEPENDENT_AMBULATORY_CARE_PROVIDER_SITE_OTHER): Payer: Self-pay | Admitting: Cardiology

## 2009-08-02 LAB — CONVERTED CEMR LAB: POC INR: 2.7

## 2009-08-04 ENCOUNTER — Telehealth: Payer: Self-pay | Admitting: Internal Medicine

## 2009-08-30 ENCOUNTER — Ambulatory Visit: Payer: Self-pay | Admitting: Cardiology

## 2009-09-27 ENCOUNTER — Encounter (INDEPENDENT_AMBULATORY_CARE_PROVIDER_SITE_OTHER): Payer: Self-pay | Admitting: Cardiology

## 2009-09-27 ENCOUNTER — Ambulatory Visit: Payer: Self-pay | Admitting: Cardiology

## 2009-10-25 ENCOUNTER — Ambulatory Visit: Payer: Self-pay | Admitting: Internal Medicine

## 2009-10-25 LAB — CONVERTED CEMR LAB
ALT: 27 units/L (ref 0–53)
AST: 27 units/L (ref 0–37)
Albumin: 3.7 g/dL (ref 3.5–5.2)
Alkaline Phosphatase: 59 units/L (ref 39–117)
Chloride: 109 meq/L (ref 96–112)
Creatinine, Ser: 1 mg/dL (ref 0.4–1.5)
GFR calc non Af Amer: 78.23 mL/min (ref >60)
LDL Cholesterol: 109 mg/dL — ABNORMAL HIGH (ref 0–99)
POC INR: 2.4
Potassium: 4.2 meq/L (ref 3.5–5.1)
Total CHOL/HDL Ratio: 5
Triglycerides: 166 mg/dL — ABNORMAL HIGH (ref 0.0–149.0)
VLDL: 33.2 mg/dL (ref 0.0–40.0)

## 2009-11-01 ENCOUNTER — Ambulatory Visit: Payer: Self-pay | Admitting: Internal Medicine

## 2009-11-01 ENCOUNTER — Telehealth (INDEPENDENT_AMBULATORY_CARE_PROVIDER_SITE_OTHER): Payer: Self-pay | Admitting: *Deleted

## 2009-11-07 ENCOUNTER — Encounter: Payer: Self-pay | Admitting: Internal Medicine

## 2009-11-16 ENCOUNTER — Ambulatory Visit: Payer: Self-pay | Admitting: Cardiovascular Disease

## 2009-11-24 ENCOUNTER — Telehealth (INDEPENDENT_AMBULATORY_CARE_PROVIDER_SITE_OTHER): Payer: Self-pay | Admitting: *Deleted

## 2009-11-25 ENCOUNTER — Ambulatory Visit: Payer: Self-pay | Admitting: Cardiology

## 2009-11-25 ENCOUNTER — Encounter: Payer: Self-pay | Admitting: Cardiovascular Disease

## 2009-11-25 ENCOUNTER — Ambulatory Visit: Payer: Self-pay

## 2009-11-25 ENCOUNTER — Encounter (HOSPITAL_COMMUNITY): Admission: RE | Admit: 2009-11-25 | Discharge: 2010-01-26 | Payer: Self-pay | Admitting: Cardiovascular Disease

## 2009-11-26 ENCOUNTER — Telehealth: Payer: Self-pay | Admitting: Internal Medicine

## 2009-12-23 ENCOUNTER — Encounter: Payer: Self-pay | Admitting: Internal Medicine

## 2009-12-23 ENCOUNTER — Ambulatory Visit: Payer: Self-pay | Admitting: Cardiology

## 2009-12-23 LAB — CONVERTED CEMR LAB: POC INR: 2.6

## 2010-01-19 ENCOUNTER — Telehealth: Payer: Self-pay | Admitting: Internal Medicine

## 2010-01-20 ENCOUNTER — Ambulatory Visit: Payer: Self-pay | Admitting: Internal Medicine

## 2010-01-20 LAB — CONVERTED CEMR LAB: POC INR: 2.1

## 2010-01-25 ENCOUNTER — Encounter: Payer: Self-pay | Admitting: Internal Medicine

## 2010-02-01 ENCOUNTER — Ambulatory Visit: Payer: Self-pay | Admitting: Internal Medicine

## 2010-02-01 LAB — CONVERTED CEMR LAB
BUN: 20 mg/dL (ref 6–23)
CO2: 28 meq/L (ref 19–32)
Calcium: 8.8 mg/dL (ref 8.4–10.5)
Chloride: 108 meq/L (ref 96–112)

## 2010-02-07 ENCOUNTER — Ambulatory Visit: Payer: Self-pay | Admitting: Internal Medicine

## 2010-02-07 DIAGNOSIS — K149 Disease of tongue, unspecified: Secondary | ICD-10-CM | POA: Insufficient documentation

## 2010-02-17 ENCOUNTER — Ambulatory Visit: Payer: Self-pay | Admitting: Cardiology

## 2010-03-15 ENCOUNTER — Encounter: Payer: Self-pay | Admitting: Internal Medicine

## 2010-03-16 ENCOUNTER — Ambulatory Visit: Payer: Self-pay | Admitting: Cardiology

## 2010-04-13 ENCOUNTER — Ambulatory Visit: Payer: Self-pay | Admitting: Cardiovascular Disease

## 2010-04-26 ENCOUNTER — Ambulatory Visit: Payer: Self-pay | Admitting: Cardiology

## 2010-05-10 ENCOUNTER — Ambulatory Visit: Payer: Self-pay | Admitting: Cardiology

## 2010-05-11 ENCOUNTER — Ambulatory Visit: Payer: Self-pay | Admitting: Internal Medicine

## 2010-05-13 ENCOUNTER — Telehealth (INDEPENDENT_AMBULATORY_CARE_PROVIDER_SITE_OTHER): Payer: Self-pay | Admitting: *Deleted

## 2010-05-13 LAB — CONVERTED CEMR LAB
ALT: 27 units/L (ref 0–53)
AST: 27 units/L (ref 0–37)
Albumin: 4.1 g/dL (ref 3.5–5.2)
BUN: 24 mg/dL — ABNORMAL HIGH (ref 6–23)
Chloride: 109 meq/L (ref 96–112)
Cholesterol: 206 mg/dL — ABNORMAL HIGH (ref 0–200)
Direct LDL: 137.9 mg/dL
Eosinophils Relative: 2 % (ref 0.0–5.0)
Glucose, Bld: 94 mg/dL (ref 70–99)
HCT: 42.3 % (ref 39.0–52.0)
Hemoglobin: 14.5 g/dL (ref 13.0–17.0)
Lymphs Abs: 1.2 10*3/uL (ref 0.7–4.0)
MCV: 90.9 fL (ref 78.0–100.0)
Monocytes Absolute: 0.4 10*3/uL (ref 0.1–1.0)
Monocytes Relative: 6.2 % (ref 3.0–12.0)
Neutro Abs: 4.2 10*3/uL (ref 1.4–7.7)
Nitrite: NEGATIVE
Platelets: 127 10*3/uL — ABNORMAL LOW (ref 150.0–400.0)
Potassium: 4.6 meq/L (ref 3.5–5.1)
RDW: 14.5 % (ref 11.5–14.6)
Sodium: 143 meq/L (ref 135–145)
Specific Gravity, Urine: >=1.03 (ref 1.000–1.030)
Total Bilirubin: 1.2 mg/dL (ref 0.3–1.2)
Total CHOL/HDL Ratio: 6
Total Protein, Urine: 30 mg/dL
Total Protein: 7 g/dL (ref 6.0–8.3)
Triglycerides: 136 mg/dL (ref 0.0–149.0)
Urine Glucose: NEGATIVE mg/dL
WBC: 5.9 10*3/uL (ref 4.5–10.5)
pH: 5.5 (ref 5.0–8.0)

## 2010-05-16 ENCOUNTER — Encounter: Payer: Self-pay | Admitting: Internal Medicine

## 2010-05-18 ENCOUNTER — Telehealth (INDEPENDENT_AMBULATORY_CARE_PROVIDER_SITE_OTHER): Payer: Self-pay | Admitting: *Deleted

## 2010-05-20 ENCOUNTER — Ambulatory Visit: Payer: Self-pay | Admitting: Cardiology

## 2010-05-25 ENCOUNTER — Encounter: Payer: Self-pay | Admitting: Internal Medicine

## 2010-05-27 ENCOUNTER — Ambulatory Visit: Payer: Self-pay | Admitting: Internal Medicine

## 2010-06-10 ENCOUNTER — Ambulatory Visit: Payer: Self-pay | Admitting: Cardiology

## 2010-06-10 LAB — CONVERTED CEMR LAB: POC INR: 3.3

## 2010-07-01 ENCOUNTER — Ambulatory Visit: Payer: Self-pay | Admitting: Cardiology

## 2010-07-22 ENCOUNTER — Ambulatory Visit: Payer: Self-pay | Admitting: Cardiology

## 2010-07-26 ENCOUNTER — Telehealth: Payer: Self-pay | Admitting: Internal Medicine

## 2010-08-04 ENCOUNTER — Encounter: Payer: Self-pay | Admitting: Internal Medicine

## 2010-08-10 ENCOUNTER — Ambulatory Visit: Payer: Self-pay | Admitting: Internal Medicine

## 2010-08-15 ENCOUNTER — Encounter: Payer: Self-pay | Admitting: Physician Assistant

## 2010-08-18 LAB — CONVERTED CEMR LAB: POC INR: 2.8

## 2010-08-19 ENCOUNTER — Ambulatory Visit: Payer: Self-pay | Admitting: Cardiology

## 2010-09-08 ENCOUNTER — Ambulatory Visit
Admission: RE | Admit: 2010-09-08 | Discharge: 2010-09-08 | Payer: Self-pay | Source: Home / Self Care | Attending: Cardiology | Admitting: Cardiology

## 2010-09-15 ENCOUNTER — Ambulatory Visit: Admission: RE | Admit: 2010-09-15 | Discharge: 2010-09-15 | Payer: Self-pay | Source: Home / Self Care

## 2010-09-29 NOTE — Progress Notes (Signed)
Summary: Nuclear Pre-Procedure  Phone Note Outgoing Call Call back at Home Phone [phone]   Call placed by: Stanton Kidney, EMT-P,  November 24, 2009 3:17 PM Action Taken: Phone Call Completed Summary of Call: Reviewed information on [employer] Information Sheet (see scanned document for further details).  Spoke with Patient.    Nuclear Med Background Indications for Stress Test: Evaluation for Ischemia   History: COPD, Echo, Myocardial Perfusion Study, MVP  History Comments: '04 Echo: TEE, EF=60%, MVP, possible pulmonary stent 10/08 MPS: EF=58%  Symptoms: Chest Pain    Nuclear Pre-Procedure Cardiac Risk Factors: Carotid Disease, Family History - CAD, History of Smoking, Hypertension, Lipids, PVD, TIA Height (in): 67  Nuclear Med Study Referring MD:  Sonda Primes

## 2010-09-29 NOTE — Medication Information (Signed)
Summary: rov/mw   Anticoagulant Therapy  Managed by: Weston Brass, PharmD Referring MD: Sonda Primes PCP: Dr. Posey Rea Supervising MD: Tenny Craw MD, Gunnar Fusi Indication 1: Deep Venous Thrombosis (ICD-451.19) Indication 2: CVA-stroke (ICD-436) Lab Used: [facility] Site: [facility] INR POC 1.7 INR RANGE 2 - 3  Dietary changes: no    Health status changes: no    Bleeding/hemorrhagic complications: no    Recent/future hospitalizations: no    Any changes in medication regimen? yes       Details: finished Cipro yesterday  Recent/future dental: no  Any missed doses?: no       Is patient compliant with meds? yes       Allergies: 1)  ! Niacin (Niacin) 2)  ! * Ivp Dye 3)  ! Cozaar 4)  ! * Ramipril 5)  ! * Lidoderm Patch 6)  ! Hydrochlorothiazide 7)  Ultram (Tramadol Hcl) 8)  Nyquil (Pseudoeph-Doxylamine-Dm-Apap) 9)  Ceftin 10)  Lovastatin (Lovastatin)  Anticoagulation Management History:      The patient is taking warfarin and comes in today for a routine follow up visit.  Positive risk factors for bleeding include an age of 59 years or older, history of CVA/TIA, and history of GI bleeding.  The bleeding index is 'high risk'.  Positive CHADS2 values include History of HTN and Prior Stroke/CVA/TIA.  Negative CHADS2 values include Age > 90 years old and History of Diabetes.  The start date was 06/22/2003.  Anticoagulation responsible provider: Tenny Craw MD, Gunnar Fusi.  INR POC: 1.7.  Cuvette Lot#: [id number].  Exp: 06/2011.    Anticoagulation Management Assessment/Plan:      The patient's current anticoagulation dose is Coumadin 5 mg tabs: Take as directed by coumadin clinic..  The target INR is 2 - 3.  The next INR is due 06/10/2010.  Anticoagulation instructions were given to patient.  Results were reviewed/authorized by Weston Brass, PharmD.  He was notified by Weston Brass PharmD.         Prior Anticoagulation Instructions: INR 4.1   Skip your dose of coumadin tomorrow then decrease dose  to 1/2 tablet every day except 1 tablet on Tuesday, Thursday and Saturday.  Recheck INR in 1 week.  Current Anticoagulation Instructions: INR 1.7  Take 1 tablet today then resume previous dose of 1 tablet every day except 1/2 tablet on Monday and Friday.  Recheck INR in 2 weeks.

## 2010-09-29 NOTE — Assessment & Plan Note (Signed)
Summary: 3 MTH FU---STC   Vital Signs:  Patient profile:   75 year old male Height:      67 inches Weight:      179 pounds BMI:     28.14 Temp:     97.8 degrees F oral Pulse rate:   72 / minute Pulse rhythm:   regular Resp:     16 per minute BP sitting:   130 / 70  (left arm) Cuff size:   regular  Vitals Entered By: Lanier Prude, CMA(AAMA) (August 10, 2010 10:08 AM) CC: 3 mo f/u  Is Patient Diabetic? No Comments pt has concerns about Micardis   Primary Care Janelis Stelzer:  Dr. Posey Rea  CC:  3 mo f/u .  History of Present Illness: The patient presents for a follow up of hypertension, anticoagulation, hyperlipidemia   Current Medications (verified): 1)  Micardis 80 Mg Tabs (Telmisartan) .Marland Kitchen.. 1 By Mouth Qd 2)  Verapamil Hcl Cr 180 Mg  Cp24 (Verapamil Hcl) .Marland Kitchen.. 1 By Mouth Once Daily 3)  Omeprazole 20 Mg  Cpdr (Omeprazole) .Marland Kitchen.. 1 By Mouth Once Daily As Needed 4)  Vicodin 5-500 Mg  Tabs (Hydrocodone-Acetaminophen) .Marland Kitchen.. 1 Qid As Needed 5)  Flomax 0.4 Mg  Cp24 (Tamsulosin Hcl) .Marland Kitchen.. 1 Po Qd As Needed 6)  Levitra 20 Mg  Tabs (Vardenafil Hcl) .... 1/2-1 Once Daily As Needed 7)  Aspirin 81 Mg  Tbec (Aspirin) .... One By Mouth Every Day 8)  Vitamin D3 1000 Unit  Tabs (Cholecalciferol) .Marland Kitchen.. 1 Qd 9)  Triamcinolone Acetonide 0.5 % Crea (Triamcinolone Acetonide) .... Apply Bid To Affected Area 10)  Fluticasone Propionate 50 Mcg/act  Susp (Fluticasone Propionate) .... 2 Sprays Each Nostril Once Daily 11)  Loratadine 10 Mg  Tabs (Loratadine) .... Once Daily As Needed Allergies 12)  Alprazolam 0.5 Mg  Tabs (Alprazolam) .Marland Kitchen.. 1 By Mouth Two Times A Day As Needed Anxiety 13)  Coumadin 5 Mg Tabs (Warfarin Sodium) .... Take As Directed By Coumadin Clinic. 14)  Clonidine Hcl 0.1 Mg Tabs (Clonidine Hcl) .Marland Kitchen.. 1 By Mouth Tid 15)  Proair Hfa 108 (90 Base) Mcg/act Aers (Albuterol Sulfate) .... 2 Inh Qid As Needed 16)  Flomax 0.4 Mg Caps (Tamsulosin Hcl) .Marland Kitchen.. 1 By Mouth Once Daily  Allergies  (verified): 1)  ! Niacin (Niacin) 2)  ! * Ivp Dye 3)  ! Cozaar 4)  ! * Ramipril 5)  ! * Lidoderm Patch 6)  ! Hydrochlorothiazide 7)  Ultram (Tramadol Hcl) 8)  Nyquil (Pseudoeph-Doxylamine-Dm-Apap) 9)  Ceftin 10)  Lovastatin (Lovastatin)  Past History:  Past Medical History: Last updated: 01/08/2009 Anticoagulation therapy: for previous sensory TIA and lupus anticoagulant GERD/erosive esophagitis Hypertension Hyperlipidemia Osteoarthritis -    right knee Cerebrovascular accident, hx of Low back pain Vit D def Nephrolithiasis Benign prostatic hypertrophy Peripheral vascular disease - carotids < 70% Colonic polyps, hx of Allergic rhinitis Hyperlipidemia E.D. TIA 2009  Social History: Last updated: 10/25/2007 Retired Married Former Smoker Alcohol use-no  Review of Systems  The patient denies fever, chest pain, dyspnea on exertion, prolonged cough, and depression.         knee pains  Physical Exam  General:  pleasant and cooperative, in no acute distress Head:  Normocephalic and atraumatic without obvious abnormalities. No apparent alopecia or balding. Eyes:  No corneal or conjunctival inflammation noted. EOMI. Perrla. Ears:  External ear exam shows no significant lesions or deformities.  Otoscopic examination reveals clear canals, tympanic membranes are intact bilaterally without bulging, retraction, inflammation or discharge.  Hearing is grossly normal bilaterally. Nose:  External nasal examination shows no deformity or inflammation. Nasal mucosa are pink and moist without lesions or exudates. Mouth:  Oral mucosa and oropharynx without lesions or exudates.  Teeth in good repair. Lungs:  Normal respiratory effort, chest expands symmetrically. Lungs are clear to auscultation, no crackles or wheezes. Heart:  Normal rate and regular rhythm. S1 and S2 normal without gallop, murmur, click, rub or other extra sounds. Abdomen:  Bowel sounds positive,abdomen soft and  non-tender without masses, organomegaly or hernias noted. Msk:  No deformity or scoliosis noted of thoracic or lumbar spine.  Stiff LS. R knee w/medial tenderness. R IT band is tendet Extremities:  No clubbing, cyanosis, edema, or deformity noted with normal full range of motion of all joints.   Neurologic:  No cranial nerve deficits noted. Station and gait are normal. Plantar reflexes are down-going bilaterally. DTRs are symmetrical throughout. Sensory, motor and coordinative functions appear intact. Skin:  Intact without suspicious lesions or rashes Cervical Nodes:  No lymphadenopathy noted Psych:  Cognition and judgment appear intact. Alert and cooperative with normal attention span and concentration. No apparent delusions, illusions, hallucinations   Impression & Recommendations:  Problem # 1:  TRANSIENT ISCHEMIC ATTACK (ICD-435.9) Assessment Improved  His updated medication list for this problem includes:    Aspirin 81 Mg Tbec (Aspirin) ..... One by mouth every day    Coumadin 5 Mg Tabs (Warfarin sodium) .Marland Kitchen... Take as directed by coumadin clinic.  Problem # 2:  LOW BACK PAIN (ICD-724.2) Assessment: Unchanged  His updated medication list for this problem includes:    Vicodin 5-500 Mg Tabs (Hydrocodone-acetaminophen) .Marland Kitchen... 1 qid as needed    Aspirin 81 Mg Tbec (Aspirin) ..... One by mouth every day  Problem # 3:  OSTEOARTHRITIS (ICD-715.90) Assessment: Unchanged  His updated medication list for this problem includes:    Vicodin 5-500 Mg Tabs (Hydrocodone-acetaminophen) .Marland Kitchen... 1 qid as needed    Aspirin 81 Mg Tbec (Aspirin) ..... One by mouth every day  Problem # 4:  PERIPHERAL VASCULAR DISEASE (ICD-443.9) Assessment: Unchanged  On the regimen of medicine(s) reflected in the chart    Problem # 5:  MYALGIA (ICD-729.1) Assessment: Unchanged  His updated medication list for this problem includes:    Vicodin 5-500 Mg Tabs (Hydrocodone-acetaminophen) .Marland Kitchen... 1 qid as needed     Aspirin 81 Mg Tbec (Aspirin) ..... One by mouth every day  Complete Medication List: 1)  Verapamil Hcl Cr 180 Mg Cp24 (Verapamil hcl) .Marland Kitchen.. 1 by mouth once daily 2)  Omeprazole 20 Mg Cpdr (Omeprazole) .Marland Kitchen.. 1 by mouth once daily as needed 3)  Vicodin 5-500 Mg Tabs (Hydrocodone-acetaminophen) .Marland Kitchen.. 1 qid as needed 4)  Levitra 20 Mg Tabs (Vardenafil hcl) .... 1/2-1 once daily as needed 5)  Aspirin 81 Mg Tbec (Aspirin) .... One by mouth every day 6)  Vitamin D3 1000 Unit Tabs (Cholecalciferol) .Marland Kitchen.. 1 qd 7)  Triamcinolone Acetonide 0.5 % Crea (Triamcinolone acetonide) .... Apply bid to affected area 8)  Fluticasone Propionate 50 Mcg/act Susp (Fluticasone propionate) .... 2 sprays each nostril once daily 9)  Loratadine 10 Mg Tabs (Loratadine) .... Once daily as needed allergies 10)  Alprazolam 0.5 Mg Tabs (Alprazolam) .Marland Kitchen.. 1 by mouth two times a day as needed anxiety 11)  Coumadin 5 Mg Tabs (Warfarin sodium) .... Take as directed by coumadin clinic. 12)  Clonidine Hcl 0.1 Mg Tabs (Clonidine hcl) .Marland Kitchen.. 1 by mouth tid 13)  Proair Hfa 108 (90 Base) Mcg/act Aers (  Albuterol sulfate) .... 2 inh qid as needed 14)  Flomax 0.4 Mg Caps (Tamsulosin hcl) .Marland Kitchen.. 1 by mouth once daily 15)  Cozaar 100 Mg Tabs (Losartan potassium) .Marland Kitchen.. 1 by mouth qd  Patient Instructions: 1)  Please schedule a follow-up appointment in 3 months well w/labs. Prescriptions: COZAAR 100 MG TABS (LOSARTAN POTASSIUM) 1 by mouth qd  #90 x 3   Entered and Authorized by:   Tresa Garter MD   Signed by:   Tresa Garter MD on 08/10/2010   Method used:   Print then Give to Patient   RxID:   [id number]    Orders Added: 1)  Est. Patient Level IV [14782]

## 2010-09-29 NOTE — Assessment & Plan Note (Signed)
Summary: YEARLY FU/ MEDICARE / LABS AFTER/NWS   Vital Signs:  Patient profile:   75 year old male Height:      67 inches Weight:      174 pounds BMI:     27.35 Temp:     97.5 degrees F oral Pulse rate:   64 / minute Pulse rhythm:   regular Resp:     16 per minute BP sitting:   150 / 84  (left arm) Cuff size:   regular  Vitals Entered By: Lanier Prude, CMA(AAMA) (May 11, 2010 8:22 AM) CC: MWV c/o lt shoulder pain radiating to chest & back Is Patient Diabetic? No   Primary Care Soumya Colson:  Dr. Posey Rea  CC:  MWV c/o lt shoulder pain radiating to chest & back.  History of Present Illness: The patient presents for a preventive health examination  Patient past medical history, social history, and family history reviewed in detail no significant changes.  Patient is physically active. Depression is negative and mood is good. Hearing is decreased, and able to perform activities of daily living. Risk of falling is negligible and home safety has been reviewed and is appropriate. Patient has normal height, weight, and visual acuity. Patient has been counseled on age-appropriate routine health concerns for screening and prevention. Education, counseling done.   C/o R LE pain   Preventive Screening-Counseling & Management  Alcohol-Tobacco     Alcohol Counseling: not indicated; use of alcohol is not excessive or problematic     Smoking Status: quit > 6 months  Caffeine-Diet-Exercise     Caffeine Counseling: not indicated; caffeine use is not excessive or problematic     Diet Counseling: to improve diet; diet is suboptimal     Does Patient Exercise: no  Hep-HIV-STD-Contraception     Hepatitis Risk: no risk noted  Safety-Violence-Falls     Seat Belt Use: yes      Sexual History:  currently monogamous.    Current Medications (verified): 1)  Micardis 80 Mg Tabs (Telmisartan) .Marland Kitchen.. 1 By Mouth Qd 2)  Verapamil Hcl Cr 180 Mg  Cp24 (Verapamil Hcl) .Marland Kitchen.. 1 By Mouth Once Daily 3)   Omeprazole 20 Mg  Cpdr (Omeprazole) .Marland Kitchen.. 1 By Mouth Once Daily As Needed 4)  Vicodin 5-500 Mg  Tabs (Hydrocodone-Acetaminophen) .Marland Kitchen.. 1 Qid As Needed 5)  Flomax 0.4 Mg  Cp24 (Tamsulosin Hcl) .Marland Kitchen.. 1 Po Qd As Needed 6)  Levitra 20 Mg  Tabs (Vardenafil Hcl) .... 1/2-1 Once Daily As Needed 7)  Aspirin 81 Mg  Tbec (Aspirin) .... One By Mouth Every Day 8)  Vitamin D3 1000 Unit  Tabs (Cholecalciferol) .Marland Kitchen.. 1 Qd 9)  Ventolin Hfa 108 (90 Base) Mcg/act  Aers (Albuterol Sulfate) .... 2 Inh Qid Prn 10)  Triamcinolone Acetonide 0.5 % Crea (Triamcinolone Acetonide) .... Apply Bid To Affected Area 11)  Fluticasone Propionate 50 Mcg/act  Susp (Fluticasone Propionate) .... 2 Sprays Each Nostril Once Daily 12)  Loratadine 10 Mg  Tabs (Loratadine) .... Once Daily As Needed Allergies 13)  Alprazolam 0.5 Mg  Tabs (Alprazolam) .Marland Kitchen.. 1 By Mouth Two Times A Day As Needed Anxiety 14)  Coumadin 5 Mg Tabs (Warfarin Sodium) .... Take As Directed By Coumadin Clinic. 15)  Clonidine Hcl 0.1 Mg Tabs (Clonidine Hcl) .Marland Kitchen.. 1 By Mouth Tid 16)  Proair Hfa 108 (90 Base) Mcg/act Aers (Albuterol Sulfate) .... 2 Inh Qid As Needed  Allergies (verified): 1)  ! Niacin (Niacin) 2)  ! * Ivp Dye 3)  ! Cozaar 4)  ! *  Ramipril 5)  ! * Lidoderm Patch 6)  ! Hydrochlorothiazide 7)  Ultram (Tramadol Hcl) 8)  Nyquil (Pseudoeph-Doxylamine-Dm-Apap) 9)  Ceftin 10)  Lovastatin (Lovastatin)  Past History:  Past Medical History: Last updated: 01/08/2009 Anticoagulation therapy: for previous sensory TIA and lupus anticoagulant GERD/erosive esophagitis Hypertension Hyperlipidemia Osteoarthritis -    right knee Cerebrovascular accident, hx of Low back pain Vit D def Nephrolithiasis Benign prostatic hypertrophy Peripheral vascular disease - carotids < 70% Colonic polyps, hx of Allergic rhinitis Hyperlipidemia E.D. TIA 2009  Past Surgical History: Last updated: 01/08/2009 s/p lithotripsy s/p sinus surgury Rotator cuff repair - x  2 s/p lung biopsy 1980  Family History: Last updated: 10/25/2007 Family History Hypertension grandparents with heart disease  Social History: Last updated: 10/25/2007 Retired Married Former Smoker Alcohol use-no  Social History: Smoking Status:  quit > 6 months Does Patient Exercise:  no Hepatitis Risk:  no risk noted Seat Belt Use:  yes Sexual History:  currently monogamous  Review of Systems       The patient complains of prolonged cough and difficulty walking.  The patient denies anorexia, fever, weight loss, weight gain, vision loss, decreased hearing, hoarseness, chest pain, syncope, dyspnea on exertion, peripheral edema, headaches, hemoptysis, abdominal pain, melena, hematochezia, severe indigestion/heartburn, hematuria, incontinence, genital sores, muscle weakness, suspicious skin lesions, transient blindness, depression, unusual weight change, abnormal bleeding, enlarged lymph nodes, angioedema, and testicular masses.         R LE  hurts  Physical Exam  General:  pleasant and cooperative, in no acute distress Head:  Normocephalic and atraumatic without obvious abnormalities. No apparent alopecia or balding. Eyes:  No corneal or conjunctival inflammation noted. EOMI. Perrla. Ears:  External ear exam shows no significant lesions or deformities.  Otoscopic examination reveals clear canals, tympanic membranes are intact bilaterally without bulging, retraction, inflammation or discharge. Hearing is grossly normal bilaterally. Nose:  External nasal examination shows no deformity or inflammation. Nasal mucosa are pink and moist without lesions or exudates. Mouth:  Oral mucosa and oropharynx without lesions or exudates.  Teeth in good repair. Neck:  supple, no thyromegaly, lymphodenopathy or masses Lungs:  Normal respiratory effort, chest expands symmetrically. Lungs are clear to auscultation, no crackles or wheezes. Heart:  Normal rate and regular rhythm. S1 and S2 normal  without gallop, murmur, click, rub or other extra sounds. Abdomen:  Bowel sounds positive,abdomen soft and non-tender without masses, organomegaly or hernias noted. Rectal:  No external abnormalities noted. Normal sphincter tone. No rectal masses or tenderness. G(-) Genitalia:  Testes bilaterally descended without nodularity, tenderness or masses. No scrotal masses or lesions. No penis lesions or urethral discharge. Prostate:  no gland enlargement.   Msk:  No deformity or scoliosis noted of thoracic or lumbar spine.  Stiff LS. R knee w/medial tenderness. R IT band is tendet Neurologic:  No cranial nerve deficits noted. Station and gait are normal. Plantar reflexes are down-going bilaterally. DTRs are symmetrical throughout. Sensory, motor and coordinative functions appear intact. Skin:  Intact without suspicious lesions or rashes Cervical Nodes:  No lymphadenopathy noted Inguinal Nodes:  No significant adenopathy Psych:  Cognition and judgment appear intact. Alert and cooperative with normal attention span and concentration. No apparent delusions, illusions, hallucinations   Impression & Recommendations:  Problem # 1:  HEALTH MAINTENANCE EXAM (ICD-V70.0) Assessment New Overall doing well, age appropriate education and counseling updated and referral for appropriate preventive services done unless declined, immunizations up to date or declined, diet counseling done if overweight, urged to  quit smoking if smokes, most recent labs reviewed and current ordered if appropriate, ecg reviewed or declined (interpretation per ECG scanned in the EMR if done); information regarding Medicare Preventation requirements given if appropriate.  Orders: TLB-BMP (Basic Metabolic Panel-BMET) (80048-METABOL) TLB-CBC Platelet - w/Differential (85025-CBCD) TLB-Hepatic/Liver Function Pnl (80076-HEPATIC) TLB-Lipid Panel (80061-LIPID) TLB-PSA (Prostate Specific Antigen) (84153-PSA) TLB-TSH (Thyroid Stimulating  Hormone) (84443-TSH) TLB-Udip ONLY (81003-UDIP) Medicare -1st Annual Wellness Visit [phone])  Problem # 2:  LEG PAIN (ICD-729.5) R - R knee OA and R IT band pain Assessment: New  Orders: Orthopedic Referral (Ortho) Dr Renae Fickle  Problem # 3:  TRANSIENT ISCHEMIC ATTACK (ICD-435.9) Assessment: Unchanged  His updated medication list for this problem includes:    Aspirin 81 Mg Tbec (Aspirin) ..... One by mouth every day    Coumadin 5 Mg Tabs (Warfarin sodium) .Marland Kitchen... Take as directed by coumadin clinic.  Problem # 4:  HYPERLIPIDEMIA (ICD-272.4) Assessment: Unchanged  Problem # 5:  HYPERTENSION (ICD-401.9) Assessment: Unchanged  His updated medication list for this problem includes:    Micardis 80 Mg Tabs (Telmisartan) .Marland Kitchen... 1 by mouth qd    Verapamil Hcl Cr 180 Mg Cp24 (Verapamil hcl) .Marland Kitchen... 1 by mouth once daily    Clonidine Hcl 0.1 Mg Tabs (Clonidine hcl) .Marland Kitchen... 1 by mouth tid  Problem # 6:  LOW BACK PAIN (ICD-724.2) Assessment: Improved  His updated medication list for this problem includes:    Vicodin 5-500 Mg Tabs (Hydrocodone-acetaminophen) .Marland Kitchen... 1 qid as needed    Aspirin 81 Mg Tbec (Aspirin) ..... One by mouth every day  Complete Medication List: 1)  Micardis 80 Mg Tabs (Telmisartan) .Marland Kitchen.. 1 by mouth qd 2)  Verapamil Hcl Cr 180 Mg Cp24 (Verapamil hcl) .Marland Kitchen.. 1 by mouth once daily 3)  Omeprazole 20 Mg Cpdr (Omeprazole) .Marland Kitchen.. 1 by mouth once daily as needed 4)  Vicodin 5-500 Mg Tabs (Hydrocodone-acetaminophen) .Marland Kitchen.. 1 qid as needed 5)  Flomax 0.4 Mg Cp24 (Tamsulosin hcl) .Marland Kitchen.. 1 po qd as needed 6)  Levitra 20 Mg Tabs (Vardenafil hcl) .... 1/2-1 once daily as needed 7)  Aspirin 81 Mg Tbec (Aspirin) .... One by mouth every day 8)  Vitamin D3 1000 Unit Tabs (Cholecalciferol) .Marland Kitchen.. 1 qd 9)  Ventolin Hfa 108 (90 Base) Mcg/act Aers (Albuterol sulfate) .... 2 inh qid prn 10)  Triamcinolone Acetonide 0.5 % Crea (Triamcinolone acetonide) .... Apply bid to affected area 11)  Fluticasone  Propionate 50 Mcg/act Susp (Fluticasone propionate) .... 2 sprays each nostril once daily 12)  Loratadine 10 Mg Tabs (Loratadine) .... Once daily as needed allergies 13)  Alprazolam 0.5 Mg Tabs (Alprazolam) .Marland Kitchen.. 1 by mouth two times a day as needed anxiety 14)  Coumadin 5 Mg Tabs (Warfarin sodium) .... Take as directed by coumadin clinic. 15)  Clonidine Hcl 0.1 Mg Tabs (Clonidine hcl) .Marland Kitchen.. 1 by mouth tid 16)  Proair Hfa 108 (90 Base) Mcg/act Aers (Albuterol sulfate) .... 2 inh qid as needed  Other Orders: Flu Vaccine 56yrs + MEDICARE PATIENTS (N6295) Administration Flu vaccine - MCR (M8413)  Patient Instructions: 1)  Please schedule a follow-up appointment in 3 months. 2)  Go on Youtube ([email or web address]) and look up  "IT band stretch" See the anatomy and learn the symptoms. Do the stretches - it may help!  Prescriptions: VICODIN 5-500 MG  TABS (HYDROCODONE-ACETAMINOPHEN) 1 qid as needed  #120 x 3   Entered and Authorized by:   Tresa Garter MD   Signed by:   Tresa Garter MD on  05/11/2010   Method used:   Print then Give to Patient   RxID:   [id number] MICARDIS 80 MG TABS (TELMISARTAN) 1 by mouth qd  #90 x 3   Entered and Authorized by:   Tresa Garter MD   Signed by:   Tresa Garter MD on 05/11/2010   Method used:   Print then Give to Patient   RxID:   [id number]  [email or web address]   Flu Vaccine Consent Questions     Do you have a history of severe allergic reactions to this vaccine? no    Any prior history of allergic reactions to egg and/or gelatin? no    Do you have a sensitivity to the preservative Thimersol? no    Do you have a past history of Guillan-Barre Syndrome? no    Do you currently have an acute febrile illness? no    Have you ever had a severe reaction to latex? no    Vaccine information given and explained to patient? yes    Are you currently pregnant? no    Lot Number:[id number]   Exp Date:02/25/2011   Site Given  Left Deltoid  IM Lanier Prude, [facility])  May 11, 2010 8:25 AM

## 2010-09-29 NOTE — Letter (Signed)
Summary: Generic Letter  [facility]  [address] [place], [place] [address]   Phone: [phone]  Fax: [phone]    01/25/2010  RE: SAYAN ALDAVA [address] [place], [place]  [address]     Dear Sharee Pimple,   Please, excuse  Mr Gamero from jury duty on    February 23, 2010 , juror number  [phone]  due to medical reasons.            Sincerely,   Jacinta Shoe MD

## 2010-09-29 NOTE — Letter (Signed)
Summary: Jude D. Servando Snare, D.D.S. - Request for Med Consult  Jude D. Servando Snare, D.D.S. - Request for Med Consult   Imported By: Marylou Mccoy 09/20/2010 15:18:31  _____________________________________________________________________  External Attachment:    Type:   Image     Comment:   External Document

## 2010-09-29 NOTE — Progress Notes (Signed)
----   Converted from flag ---- ---- 11/01/2009 1:24 PM, Edman Circle wrote: appt 3/22 @ 9:30 with Eden Emms  ---- 11/01/2009 8:31 AM, Dagoberto Reef wrote: Thanks  ---- 11/01/2009 8:26 AM, Georgina Quint Plotnikov MD wrote: The following orders have been entered for this patient and placed on Admin Hold:  Type:     Referral       Code:   Cardiology Description:   Cardiology Referral Order Date:   11/01/2009   Authorized By:   Tresa Garter MD Order #:   [id number] Clinical Notes:   Dr Eden Emms Dx atyp CP, HTN ------------------------------

## 2010-09-29 NOTE — Progress Notes (Signed)
  Phone Note From Pharmacy   Caller: [address] [phone]* Summary of Call: Per pharmacy, insurance will not cover ventolin w/o a PA. Proair is the prefered whic is on current med list// pharmacy notified and med list updated Initial call taken by: Rock Nephew CMA,  July 26, 2010 4:25 PM

## 2010-09-29 NOTE — Medication Information (Signed)
Summary: rov/sp   Anticoagulant Therapy  Managed by: Reina Fuse, PharmD Referring MD: Sonda Primes PCP: Dr. Posey Rea Supervising MD: Jens Som MD, Arlys John Indication 1: Deep Venous Thrombosis (ICD-451.19) Indication 2: CVA-stroke (ICD-436) Lab Used: [facility] Site: [facility] INR POC 1.9 INR RANGE 2 - 3  Dietary changes: no    Health status changes: no    Bleeding/hemorrhagic complications: no    Recent/future hospitalizations: no    Any changes in medication regimen? no    Recent/future dental: no  Any missed doses?: no       Is patient compliant with meds? yes       Allergies: 1)  ! Niacin (Niacin) 2)  ! * Ivp Dye 3)  ! Cozaar 4)  ! * Ramipril 5)  ! * Lidoderm Patch 6)  ! Hydrochlorothiazide 7)  Ultram (Tramadol Hcl) 8)  Nyquil (Pseudoeph-Doxylamine-Dm-Apap) 9)  Ceftin 10)  Lovastatin (Lovastatin)  Anticoagulation Management History:      The patient is taking warfarin and comes in today for a routine follow up visit.  Positive risk factors for bleeding include an age of 73 years or older, history of CVA/TIA, and history of GI bleeding.  The bleeding index is 'high risk'.  Positive CHADS2 values include History of HTN and Prior Stroke/CVA/TIA.  Negative CHADS2 values include Age > 73 years old and History of Diabetes.  The start date was 06/22/2003.  Anticoagulation responsible provider: Jens Som MD, Arlys John.  INR POC: 1.9.  Cuvette Lot#: [id number].  Exp: 05/2011.    Anticoagulation Management Assessment/Plan:      The patient's current anticoagulation dose is Coumadin 5 mg tabs: Take as directed by coumadin clinic..  The target INR is 2 - 3.  The next INR is due 05/10/2010.  Anticoagulation instructions were given to patient.  Results were reviewed/authorized by Reina Fuse, PharmD.  He was notified by Reina Fuse PharmD.         Prior Anticoagulation Instructions: INR 1.8  Today take 1 tablet, then begin 1 tablet daily except 1/2 tablet Mon, Wed and Fri.   Return to clinic in 2 weeks.  Current Anticoagulation Instructions: INR 1.9  Take Coumadin 0.5 tab (2.5 mg) on Mon, Fri and Coumadin 1 tab (5 mg) on all other days.  Return to clinic in 2 weeks.

## 2010-09-29 NOTE — Medication Information (Signed)
Summary: rov/ewj  Anticoagulant Therapy  Managed by: Eda Keys, PharmD Referring MD: Sonda Primes PCP: Dr. Posey Rea Supervising MD: Jens Som MD, Arlys John Indication 1: Deep Venous Thrombosis (ICD-451.19) Indication 2: CVA-stroke (ICD-436) Lab Used: [facility] Site: [facility] INR POC 2.3 INR RANGE 2 - 3  Dietary changes: no    Health status changes: no    Bleeding/hemorrhagic complications: no    Recent/future hospitalizations: no    Any changes in medication regimen? no    Recent/future dental: no  Any missed doses?: no       Is patient compliant with meds? yes       Current Medications (verified): 1)  Micardis 80 Mg Tabs (Telmisartan) .Marland Kitchen.. 1 By Mouth Qd 2)  Verapamil Hcl Cr 180 Mg  Cp24 (Verapamil Hcl) .Marland Kitchen.. 1 By Mouth Once Daily 3)  Omeprazole 20 Mg  Cpdr (Omeprazole) .Marland Kitchen.. 1 By Mouth Once Daily As Needed 4)  Vicodin 5-500 Mg  Tabs (Hydrocodone-Acetaminophen) .Marland Kitchen.. 1 Qid As Needed 5)  Flomax 0.4 Mg  Cp24 (Tamsulosin Hcl) .Marland Kitchen.. 1 Po Qd As Needed 6)  Levitra 20 Mg  Tabs (Vardenafil Hcl) .... 1/2-1 Once Daily As Needed 7)  Aspirin 81 Mg  Tbec (Aspirin) .... One By Mouth Every Day 8)  Vitamin D3 1000 Unit  Tabs (Cholecalciferol) .Marland Kitchen.. 1 Qd 9)  Ventolin Hfa 108 (90 Base) Mcg/act  Aers (Albuterol Sulfate) .... 2 Inh Qid Prn 10)  Triamcinolone Acetonide 0.5 % Crea (Triamcinolone Acetonide) .... Apply Bid To Affected Area 11)  Fluticasone Propionate 50 Mcg/act  Susp (Fluticasone Propionate) .... 2 Sprays Each Nostril Once Daily 12)  Loratadine 10 Mg  Tabs (Loratadine) .... Once Daily As Needed Allergies 13)  Alprazolam 0.5 Mg  Tabs (Alprazolam) .Marland Kitchen.. 1 By Mouth Two Times A Day As Needed Anxiety 14)  Coumadin 5 Mg Tabs (Warfarin Sodium) .... Take As Directed By Coumadin Clinic. 15)  Clonidine Hcl 0.1 Mg Tabs (Clonidine Hcl) .Marland Kitchen.. 1 By Mouth Tid  Allergies (verified): 1)  ! Niacin (Niacin) 2)  ! * Ivp Dye 3)  ! Cozaar 4)  ! * Ramipril 5)  ! * Lidoderm Patch 6)  !  Hydrochlorothiazide 7)  Ultram (Tramadol Hcl) 8)  Nyquil (Pseudoeph-Doxylamine-Dm-Apap) 9)  Ceftin 10)  Lovastatin (Lovastatin)  Anticoagulation Management History:      The patient is taking warfarin and comes in today for a routine follow up visit.  Positive risk factors for bleeding include an age of 75 years or older, history of CVA/TIA, and history of GI bleeding.  The bleeding index is 'high risk'.  Positive CHADS2 values include History of HTN and Prior Stroke/CVA/TIA.  Negative CHADS2 values include Age > 8 years old and History of Diabetes.  The start date was 06/22/2003.  Anticoagulation responsible provider: Jens Som MD, Arlys John.  INR POC: 2.3.  Cuvette Lot#: [id number].  Exp: 12/2010.    Anticoagulation Management Assessment/Plan:      The patient's current anticoagulation dose is Coumadin 5 mg tabs: Take as directed by coumadin clinic..  The target INR is 2 - 3.  The next INR is due 12/23/2009.  Anticoagulation instructions were given to patient.  Results were reviewed/authorized by Eda Keys, PharmD.  He was notified by Eda Keys.         Prior Anticoagulation Instructions: INR 2.4  Continue on same dosage 1/2 tablet daily except 1 tablet on Sundays, Tuesdays, and Thursdays.  Recheck in 4 weeks.    Current Anticoagulation Instructions: INR 2.3  Continue taking 1 tablet  on Sunday, Tuesday, and thursday, and 1/2 tablet all other days.  Return to clinic in 4 weeks.

## 2010-09-29 NOTE — Progress Notes (Signed)
Summary: CALL?  Phone Note Call from Patient Call back at Home Phone [phone]   Summary of Call: Patient is requesting a call from DR Plotnikov.  Initial call taken by: Lamar Sprinkles, CMA,  Jan 19, 2010 1:43 PM  Follow-up for Phone Call        I called - send to me a jury duty letter Follow-up by: Tresa Garter MD,  Jan 19, 2010 5:18 PM

## 2010-09-29 NOTE — Medication Information (Signed)
Summary: rov/sl   Anticoagulant Therapy  Managed by: Reina Fuse, PharmD Referring MD: Sonda Primes PCP: Dr. Posey Rea Supervising MD: Myrtis Ser MD, Tinnie Gens Indication 1: Deep Venous Thrombosis (ICD-451.19) Indication 2: CVA-stroke (ICD-436) Lab Used: [facility] Site:  [facility] INR POC 3.9 INR RANGE 2 - 3  Dietary changes: no    Health status changes: no    Bleeding/hemorrhagic complications: no    Recent/future hospitalizations: no    Any changes in medication regimen? no    Recent/future dental: no  Any missed doses?: no       Is patient compliant with meds? yes       Allergies: 1)  ! Niacin (Niacin) 2)  ! * Ivp Dye 3)  ! Cozaar 4)  ! * Ramipril 5)  ! * Lidoderm Patch 6)  ! Hydrochlorothiazide 7)  Ultram (Tramadol Hcl) 8)  Nyquil (Pseudoeph-Doxylamine-Dm-Apap) 9)  Ceftin 10)  Lovastatin (Lovastatin)  Anticoagulation Management History:      The patient is taking warfarin and comes in today for a routine follow up visit.  Positive risk factors for bleeding include an age of 59 years or older, history of CVA/TIA, and history of GI bleeding.  The bleeding index is 'high risk'.  Positive CHADS2 values include History of HTN and Prior Stroke/CVA/TIA.  Negative CHADS2 values include Age > 65 years old and History of Diabetes.  The start date was 06/22/2003.  Anticoagulation responsible provider: Myrtis Ser MD, Tinnie Gens.  INR POC: 3.9.  Cuvette Lot#: [id number].  Exp: 06/2011.    Anticoagulation Management Assessment/Plan:      The patient's current anticoagulation dose is Coumadin 5 mg tabs: Take as directed by coumadin clinic..  The target INR is 2 - 3.  The next INR is due 07/22/2010.  Anticoagulation instructions were given to patient.  Results were reviewed/authorized by Reina Fuse, PharmD.         Prior Anticoagulation Instructions: INR 3.3  Today, Friday, October 14th, do not take Coumadin. Then, continue taking Coumadin 5 mg (1 tab) on all days but Coumadin 2.5 mg  (0.5 tab) on Mon and Fri. Return to clinic in 3 weeks.   Current Anticoagulation Instructions: INR 3.9  Do not take Coumadin today, Friday, November 4th. Then, take Coumadin 1 tab (5 mg) on Sun, Tues, Thur, Sat and Coumadin 0.5 tab (2.5 mg) on Mon, Wed, Fri. Return to clinic in 3 weeks.

## 2010-09-29 NOTE — Medication Information (Signed)
Summary: rov/cb  Anticoagulant Therapy  Managed by: Weston Brass, PharmD Referring MD: Sonda Primes PCP: Dr. Posey Rea Supervising MD: Myrtis Ser MD, Tinnie Gens Indication 1: Deep Venous Thrombosis (ICD-451.19) Indication 2: CVA-stroke (ICD-436) Lab Used: [facility] Site: [facility] INR POC 1.8 INR RANGE 2 - 3  Dietary changes: no    Health status changes: no    Bleeding/hemorrhagic complications: no    Recent/future hospitalizations: no    Any changes in medication regimen? no    Recent/future dental: no  Any missed doses?: no       Is patient compliant with meds? yes       Allergies: 1)  ! Niacin (Niacin) 2)  ! * Ivp Dye 3)  ! Cozaar 4)  ! * Ramipril 5)  ! * Lidoderm Patch 6)  ! Hydrochlorothiazide 7)  Ultram (Tramadol Hcl) 8)  Nyquil (Pseudoeph-Doxylamine-Dm-Apap) 9)  Ceftin 10)  Lovastatin (Lovastatin)  Anticoagulation Management History:      The patient is taking warfarin and comes in today for a routine follow up visit.  Positive risk factors for bleeding include an age of 75 years or older, history of CVA/TIA, and history of GI bleeding.  The bleeding index is 'high risk'.  Positive CHADS2 values include History of HTN and Prior Stroke/CVA/TIA.  Negative CHADS2 values include Age > 30 years old and History of Diabetes.  The start date was 06/22/2003.  Anticoagulation responsible provider: Myrtis Ser MD, Tinnie Gens.  INR POC: 1.8.  Cuvette Lot#: [id number].  Exp: 05/2011.    Anticoagulation Management Assessment/Plan:      The patient's current anticoagulation dose is Coumadin 5 mg tabs: Take as directed by coumadin clinic..  The target INR is 2 - 3.  The next INR is due 04/13/2010.  Anticoagulation instructions were given to patient.  Results were reviewed/authorized by Weston Brass, PharmD.  He was notified by Dillard Cannon.         Prior Anticoagulation Instructions: INR 2.7. Take 0.5 tablet daily except 1 tablet on Tues, Thurs, and Sun. Recheck in 4 weeks.  Current  Anticoagulation Instructions: INR 1.8  Take 1 tab today. Then continue same dose of 1 tab on Sunday, Tuesday, and Thursday and 1/2 tab all other days.  Re-check INR in 4 weeks.

## 2010-09-29 NOTE — Medication Information (Signed)
Summary: rov/eac  Anticoagulant Therapy  Managed by: Elaina Pattee, PharmD Referring MD: Sonda Primes PCP: Dr. Posey Rea Supervising MD: Shirlee Latch MD, Freida Busman Indication 1: Deep Venous Thrombosis (ICD-451.19) Indication 2: CVA-stroke (ICD-436) Lab Used: [facility] Site: [facility] INR POC 2.7 INR RANGE 2 - 3  Dietary changes: no    Health status changes: no    Bleeding/hemorrhagic complications: no    Recent/future hospitalizations: no    Any changes in medication regimen? no    Recent/future dental: no  Any missed doses?: no       Is patient compliant with meds? yes       Allergies: 1)  ! Niacin (Niacin) 2)  ! * Ivp Dye 3)  ! Cozaar 4)  ! * Ramipril 5)  ! * Lidoderm Patch 6)  ! Hydrochlorothiazide 7)  Ultram (Tramadol Hcl) 8)  Nyquil (Pseudoeph-Doxylamine-Dm-Apap) 9)  Ceftin 10)  Lovastatin (Lovastatin)  Anticoagulation Management History:      The patient is taking warfarin and comes in today for a routine follow up visit.  Positive risk factors for bleeding include an age of 28 years or older, history of CVA/TIA, and history of GI bleeding.  The bleeding index is 'high risk'.  Positive CHADS2 values include History of HTN and Prior Stroke/CVA/TIA.  Negative CHADS2 values include Age > 18 years old and History of Diabetes.  The start date was 06/22/2003.  Anticoagulation responsible provider: Shirlee Latch MD, Darivs Lunden.  INR POC: 2.7.  Cuvette Lot#: [id number].  Exp: 03/2011.    Anticoagulation Management Assessment/Plan:      The patient's current anticoagulation dose is Coumadin 5 mg tabs: Take as directed by coumadin clinic..  The target INR is 2 - 3.  The next INR is due 03/16/2010.  Anticoagulation instructions were given to patient.  Results were reviewed/authorized by Elaina Pattee, PharmD.  He was notified by Elaina Pattee, PharmD.         Prior Anticoagulation Instructions: INR 2.1  Continue taking 1 tablet every sunday, Tuesday, and Thursday and 1/2 tablet all  other days.  Return to clinic in 4 weeks.      Current Anticoagulation Instructions: INR 2.7. Take 0.5 tablet daily except 1 tablet on Tues, Thurs, and Sun. Recheck in 4 weeks.

## 2010-09-29 NOTE — Medication Information (Signed)
Summary: rov/mw      Allergies Added:  Anticoagulant Therapy  Managed by: Reina Fuse, PharmD Referring MD: Sonda Primes PCP: Dr. Posey Rea Supervising MD: Myrtis Ser MD, Tinnie Gens Indication 1: Deep Venous Thrombosis (ICD-451.19) Indication 2: CVA-stroke (ICD-436) Lab Used: [facility] Site: [facility] INR POC 3.3 INR RANGE 2 - 3  Dietary changes: no    Health status changes: no    Bleeding/hemorrhagic complications: no    Recent/future hospitalizations: no    Any changes in medication regimen? no    Recent/future dental: no  Any missed doses?: no       Is patient compliant with meds? yes       Current Medications (verified): 1)  Micardis 80 Mg Tabs (Telmisartan) .Marland Kitchen.. 1 By Mouth Qd 2)  Verapamil Hcl Cr 180 Mg  Cp24 (Verapamil Hcl) .Marland Kitchen.. 1 By Mouth Once Daily 3)  Omeprazole 20 Mg  Cpdr (Omeprazole) .Marland Kitchen.. 1 By Mouth Once Daily As Needed 4)  Vicodin 5-500 Mg  Tabs (Hydrocodone-Acetaminophen) .Marland Kitchen.. 1 Qid As Needed 5)  Flomax 0.4 Mg  Cp24 (Tamsulosin Hcl) .Marland Kitchen.. 1 Po Qd As Needed 6)  Levitra 20 Mg  Tabs (Vardenafil Hcl) .... 1/2-1 Once Daily As Needed 7)  Aspirin 81 Mg  Tbec (Aspirin) .... One By Mouth Every Day 8)  Vitamin D3 1000 Unit  Tabs (Cholecalciferol) .Marland Kitchen.. 1 Qd 9)  Ventolin Hfa 108 (90 Base) Mcg/act  Aers (Albuterol Sulfate) .... 2 Inh Qid Prn 10)  Triamcinolone Acetonide 0.5 % Crea (Triamcinolone Acetonide) .... Apply Bid To Affected Area 11)  Fluticasone Propionate 50 Mcg/act  Susp (Fluticasone Propionate) .... 2 Sprays Each Nostril Once Daily 12)  Loratadine 10 Mg  Tabs (Loratadine) .... Once Daily As Needed Allergies 13)  Alprazolam 0.5 Mg  Tabs (Alprazolam) .Marland Kitchen.. 1 By Mouth Two Times A Day As Needed Anxiety 14)  Coumadin 5 Mg Tabs (Warfarin Sodium) .... Take As Directed By Coumadin Clinic. 15)  Clonidine Hcl 0.1 Mg Tabs (Clonidine Hcl) .Marland Kitchen.. 1 By Mouth Tid 16)  Proair Hfa 108 (90 Base) Mcg/act Aers (Albuterol Sulfate) .... 2 Inh Qid As Needed  Allergies  (verified): 1)  ! Niacin (Niacin) 2)  ! * Ivp Dye 3)  ! Cozaar 4)  ! * Ramipril 5)  ! * Lidoderm Patch 6)  ! Hydrochlorothiazide 7)  Ultram (Tramadol Hcl) 8)  Nyquil (Pseudoeph-Doxylamine-Dm-Apap) 9)  Ceftin 10)  Lovastatin (Lovastatin)  Anticoagulation Management History:      The patient is taking warfarin and comes in today for a routine follow up visit.  Positive risk factors for bleeding include an age of 73 years or older, history of CVA/TIA, and history of GI bleeding.  The bleeding index is 'high risk'.  Positive CHADS2 values include History of HTN and Prior Stroke/CVA/TIA.  Negative CHADS2 values include Age > 23 years old and History of Diabetes.  The start date was 06/22/2003.  Anticoagulation responsible Estalene Bergey: Myrtis Ser MD, Tinnie Gens.  INR POC: 3.3.  Cuvette Lot#: [id number].  Exp: 06/2011.    Anticoagulation Management Assessment/Plan:      The patient's current anticoagulation dose is Coumadin 5 mg tabs: Take as directed by coumadin clinic..  The target INR is 2 - 3.  The next INR is due 07/01/2010.  Anticoagulation instructions were given to patient.  Results were reviewed/authorized by Reina Fuse, PharmD.  He was notified by Reina Fuse PharmD.         Prior Anticoagulation Instructions: INR 1.7  Take 1 tablet today then resume previous dose  of 1 tablet every day except 1/2 tablet on Monday and Friday.  Recheck INR in 2 weeks.   Current Anticoagulation Instructions: INR 3.3  Today, Friday, October 14th, do not take Coumadin. Then, continue taking Coumadin 5 mg (1 tab) on all days but Coumadin 2.5 mg (0.5 tab) on Mon and Fri. Return to clinic in 3 weeks.

## 2010-09-29 NOTE — Letter (Signed)
Summary: Handout Printed  Printed Handout:  - Coumadin Instructions-w/out Meds 

## 2010-09-29 NOTE — Medication Information (Signed)
Summary: rov/ewj  Anticoagulant Therapy  Managed by: Shelby Dubin, PharmD, BCPS, CPP Referring MD: Sonda Primes PCP: Dr. Posey Rea Supervising MD: Jens Som MD, Arlys John Indication 1: Deep Venous Thrombosis (ICD-451.19) Indication 2: CVA-stroke (ICD-436) Lab Used: [facility] Site: [facility] INR POC 2.1 INR RANGE 2 - 3  Dietary changes: no    Health status changes: no    Bleeding/hemorrhagic complications: no    Recent/future hospitalizations: no    Any changes in medication regimen? no    Recent/future dental: no  Any missed doses?: no       Is patient compliant with meds? yes       Current Medications (verified): 1)  Micardis 80 Mg Tabs (Telmisartan) .Marland Kitchen.. 1 By Mouth Qd 2)  Verapamil Hcl Cr 180 Mg  Cp24 (Verapamil Hcl) .Marland Kitchen.. 1 By Mouth Once Daily 3)  Omeprazole 20 Mg  Cpdr (Omeprazole) .Marland Kitchen.. 1 By Mouth Once Daily As Needed 4)  Vicodin 5-500 Mg  Tabs (Hydrocodone-Acetaminophen) .Marland Kitchen.. 1 Qid As Needed 5)  Flomax 0.4 Mg  Cp24 (Tamsulosin Hcl) .Marland Kitchen.. 1 Po Qd As Needed 6)  Levitra 20 Mg  Tabs (Vardenafil Hcl) .... 1/2-1 Once Daily As Needed 7)  Aspirin 81 Mg  Tbec (Aspirin) .... One By Mouth Every Day 8)  Vitamin D3 1000 Unit  Tabs (Cholecalciferol) .Marland Kitchen.. 1 Qd 9)  Ventolin Hfa 108 (90 Base) Mcg/act  Aers (Albuterol Sulfate) .... 2 Inh Qid Prn 10)  Triamcinolone Acetonide 0.5 % Crea (Triamcinolone Acetonide) .... Apply Bid To Affected Area 11)  Fluticasone Propionate 50 Mcg/act  Susp (Fluticasone Propionate) .... 2 Sprays Each Nostril Once Daily 12)  Loratadine 10 Mg  Tabs (Loratadine) .... Once Daily As Needed Allergies 13)  Alprazolam 0.5 Mg  Tabs (Alprazolam) .Marland Kitchen.. 1 By Mouth Two Times A Day As Needed Anxiety 14)  Coumadin 5 Mg Tabs (Warfarin Sodium) .... Take As Directed By Coumadin Clinic. 15)  Carvedilol 25 Mg Tabs (Carvedilol) .... 1/2 By Mouth Two Times A Day X 1 Wk, Then 1 By Mouth Bid  Allergies (verified): 1)  ! Niacin (Niacin) 2)  ! * Ivp Dye 3)  ! Cozaar 4)  ! *  Ramipril 5)  ! * Lidoderm Patch 6)  ! Hydrochlorothiazide 7)  Ultram (Tramadol Hcl) 8)  Nyquil (Pseudoeph-Doxylamine-Dm-Apap) 9)  Ceftin 10)  Lovastatin (Lovastatin)  Anticoagulation Management History:      The patient is taking warfarin and comes in today for a routine follow up visit.  Positive risk factors for bleeding include an age of 45 years or older, history of CVA/TIA, and history of GI bleeding.  The bleeding index is 'high risk'.  Positive CHADS2 values include History of HTN and Prior Stroke/CVA/TIA.  Negative CHADS2 values include Age > 59 years old and History of Diabetes.  The start date was 06/22/2003.  Anticoagulation responsible provider: Jens Som MD, Arlys John.  INR POC: 2.1.  Cuvette Lot#: [id number].  Exp: 11/2010.    Anticoagulation Management Assessment/Plan:      The patient's current anticoagulation dose is Coumadin 5 mg tabs: Take as directed by coumadin clinic..  The target INR is 2 - 3.  The next INR is due 10/25/2009.  Anticoagulation instructions were given to patient.  Results were reviewed/authorized by Shelby Dubin, PharmD, BCPS, CPP.  He was notified by Shelby Dubin PharmD, BCPS, CPP.         Prior Anticoagulation Instructions: INR 2.1  Continue on same dosage 1/2 tablet daily except 1 tablet on Sundays, Tuesdays, and Thursdays.  Recheck  in 4 weeks.    Current Anticoagulation Instructions: INR 2.1  Continue taking 1 tab each Sunday, Tuesday, Thursday and 0.5 tab on all other days.   Recheck in 4 weeks.

## 2010-09-29 NOTE — Letter (Signed)
Summary: [facility]  [facility]   Imported By: Lester Bethel 06/30/2010 08:53:00  _____________________________________________________________________  External Attachment:    Type:   Image     Comment:   External Document

## 2010-09-29 NOTE — Medication Information (Signed)
Summary: rov.mp  Anticoagulant Therapy  Managed by: Cloyde Reams, RN, BSN Referring MD: Sonda Primes PCP: Dr. Posey Rea Supervising MD: Tenny Craw MD, Gunnar Fusi Indication 1: Deep Venous Thrombosis (ICD-451.19) Indication 2: CVA-stroke (ICD-436) Lab Used: [facility] Site: [facility] INR POC 2.4 INR RANGE 2 - 3  Dietary changes: yes       Details: Incorporating sm salads in diet.    Health status changes: no    Bleeding/hemorrhagic complications: no    Recent/future hospitalizations: no    Any changes in medication regimen? no    Recent/future dental: no  Any missed doses?: no       Is patient compliant with meds? yes       Allergies (verified): 1)  ! Niacin (Niacin) 2)  ! * Ivp Dye 3)  ! Cozaar 4)  ! * Ramipril 5)  ! * Lidoderm Patch 6)  ! Hydrochlorothiazide 7)  Ultram (Tramadol Hcl) 8)  Nyquil (Pseudoeph-Doxylamine-Dm-Apap) 9)  Ceftin 10)  Lovastatin (Lovastatin)  Anticoagulation Management History:      The patient is taking warfarin and comes in today for a routine follow up visit.  Positive risk factors for bleeding include an age of 1 years or older, history of CVA/TIA, and history of GI bleeding.  The bleeding index is 'high risk'.  Positive CHADS2 values include History of HTN and Prior Stroke/CVA/TIA.  Negative CHADS2 values include Age > 56 years old and History of Diabetes.  The start date was 06/22/2003.  Anticoagulation responsible provider: Tenny Craw MD, Gunnar Fusi.  INR POC: 2.4.  Cuvette Lot#: [id number].  Exp: 12/2010.    Anticoagulation Management Assessment/Plan:      The patient's current anticoagulation dose is Coumadin 5 mg tabs: Take as directed by coumadin clinic..  The target INR is 2 - 3.  The next INR is due 11/22/2009.  Anticoagulation instructions were given to patient.  Results were reviewed/authorized by Cloyde Reams, RN, BSN.  He was notified by Cloyde Reams RN.         Prior Anticoagulation Instructions: INR 2.1  Continue taking 1 tab each  Sunday, Tuesday, Thursday and 0.5 tab on all other days.   Recheck in 4 weeks.   Current Anticoagulation Instructions: INR 2.4  Continue on same dosage 1/2 tablet daily except 1 tablet on Sundays, Tuesdays, and Thursdays.  Recheck in 4 weeks.

## 2010-09-29 NOTE — Medication Information (Signed)
Summary: rov/sp  Anticoagulant Therapy  Managed by: Eda Keys, PharmD Referring MD: Sonda Primes PCP: Dr. Posey Rea Supervising MD: Tenny Craw MD, Gunnar Fusi Indication 1: Deep Venous Thrombosis (ICD-451.19) Indication 2: CVA-stroke (ICD-436) Lab Used: [facility] Site: [facility] INR POC 2.1 INR RANGE 2 - 3  Dietary changes: yes       Details: pt eating a few more salads than norm  Health status changes: no    Bleeding/hemorrhagic complications: no    Recent/future hospitalizations: no    Any changes in medication regimen? no    Recent/future dental: no  Any missed doses?: no       Is patient compliant with meds? yes       Allergies: 1)  ! Niacin (Niacin) 2)  ! * Ivp Dye 3)  ! Cozaar 4)  ! * Ramipril 5)  ! * Lidoderm Patch 6)  ! Hydrochlorothiazide 7)  Ultram (Tramadol Hcl) 8)  Nyquil (Pseudoeph-Doxylamine-Dm-Apap) 9)  Ceftin 10)  Lovastatin (Lovastatin)  Anticoagulation Management History:      The patient is taking warfarin and comes in today for a routine follow up visit.  Positive risk factors for bleeding include an age of 73 years or older, history of CVA/TIA, and history of GI bleeding.  The bleeding index is 'high risk'.  Positive CHADS2 values include History of HTN and Prior Stroke/CVA/TIA.  Negative CHADS2 values include Age > 31 years old and History of Diabetes.  The start date was 06/22/2003.  Anticoagulation responsible provider: Tenny Craw MD, Gunnar Fusi.  INR POC: 2.1.  Cuvette Lot#: [id number].  Exp: 03/2011.    Anticoagulation Management Assessment/Plan:      The patient's current anticoagulation dose is Coumadin 5 mg tabs: Take as directed by coumadin clinic..  The target INR is 2 - 3.  The next INR is due 02/17/2010.  Anticoagulation instructions were given to patient.  Results were reviewed/authorized by Eda Keys, PharmD.  He was notified by Eda Keys.         Prior Anticoagulation Instructions: INR 2.6  Continue same dose of 1/2 tablet every  day except 1 tablet on Sunday, Tuesday, and Thursday   Current Anticoagulation Instructions: INR 2.1  Continue taking 1 tablet every sunday, Tuesday, and Thursday and 1/2 tablet all other days.  Return to clinic in 4 weeks.

## 2010-09-29 NOTE — Medication Information (Signed)
Summary: rov/ln  Anticoagulant Therapy  Managed by: Weston Brass, PharmD Referring MD: Sonda Primes PCP: Dr. Posey Rea Supervising MD: Eden Emms MD, Theron Arista Indication 1: Deep Venous Thrombosis (ICD-451.19) Indication 2: CVA-stroke (ICD-436) Lab Used: [facility] Site: [facility] INR POC 1.8 INR RANGE 2 - 3  Dietary changes: no    Health status changes: no    Bleeding/hemorrhagic complications: no    Recent/future hospitalizations: no    Any changes in medication regimen? no    Recent/future dental: no  Any missed doses?: no       Is patient compliant with meds? yes       Allergies: 1)  ! Niacin (Niacin) 2)  ! * Ivp Dye 3)  ! Cozaar 4)  ! * Ramipril 5)  ! * Lidoderm Patch 6)  ! Hydrochlorothiazide 7)  Ultram (Tramadol Hcl) 8)  Nyquil (Pseudoeph-Doxylamine-Dm-Apap) 9)  Ceftin 10)  Lovastatin (Lovastatin)  Anticoagulation Management History:      The patient is taking warfarin and comes in today for a routine follow up visit.  Positive risk factors for bleeding include an age of 19 years or older, history of CVA/TIA, and history of GI bleeding.  The bleeding index is 'high risk'.  Positive CHADS2 values include History of HTN and Prior Stroke/CVA/TIA.  Negative CHADS2 values include Age > 41 years old and History of Diabetes.  The start date was 06/22/2003.  Anticoagulation responsible Shawnelle Spoerl: Eden Emms MD, Theron Arista.  INR POC: 1.8.  Cuvette Lot#: [id number].  Exp: 05/2011.    Anticoagulation Management Assessment/Plan:      The patient's current anticoagulation dose is Coumadin 5 mg tabs: Take as directed by coumadin clinic..  The target INR is 2 - 3.  The next INR is due 04/26/2010.  Anticoagulation instructions were given to patient.  Results were reviewed/authorized by Weston Brass, PharmD.  He was notified by Liana Gerold, PharmD Candidate.         Prior Anticoagulation Instructions: INR 1.8  Take 1 tab today. Then continue same dose of 1 tab on Sunday, Tuesday, and  Thursday and 1/2 tab all other days.  Re-check INR in 4 weeks.  Current Anticoagulation Instructions: INR 1.8  Today take 1 tablet, then begin 1 tablet daily except 1/2 tablet Mon, Wed and Fri.  Return to clinic in 2 weeks.

## 2010-09-29 NOTE — Medication Information (Signed)
Summary: rov/sl   Anticoagulant Therapy  Managed by: Weston Brass, PharmD Referring MD: Sonda Primes PCP: Dr. Posey Rea Supervising MD: Antoine Poche MD, Fayrene Fearing Indication 1: Deep Venous Thrombosis (ICD-451.19) Indication 2: CVA-stroke (ICD-436) Lab Used: [facility] Site: [facility] INR POC 2.8 INR RANGE 2 - 3  Dietary changes: no    Health status changes: no    Bleeding/hemorrhagic complications: no    Recent/future hospitalizations: no    Any changes in medication regimen? no    Recent/future dental: no  Any missed doses?: no       Is patient compliant with meds? yes       Allergies: 1)  ! Niacin (Niacin) 2)  ! * Ivp Dye 3)  ! Cozaar 4)  ! * Ramipril 5)  ! * Lidoderm Patch 6)  ! Hydrochlorothiazide 7)  Ultram (Tramadol Hcl) 8)  Nyquil (Pseudoeph-Doxylamine-Dm-Apap) 9)  Ceftin 10)  Lovastatin (Lovastatin)  Anticoagulation Management History:      The patient is taking warfarin and comes in today for a routine follow up visit.  Positive risk factors for bleeding include an age of 71 years or older, history of CVA/TIA, and history of GI bleeding.  The bleeding index is 'high risk'.  Positive CHADS2 values include History of HTN and Prior Stroke/CVA/TIA.  Negative CHADS2 values include Age > 70 years old and History of Diabetes.  The start date was 06/22/2003.  Anticoagulation responsible provider: Antoine Poche MD, Fayrene Fearing.  INR POC: 2.8.  Cuvette Lot#: [id number].  Exp: 07/2011.    Anticoagulation Management Assessment/Plan:      The patient's current anticoagulation dose is Coumadin 5 mg tabs: Take as directed by coumadin clinic..  The target INR is 2 - 3.  The next INR is due 08/19/2010.  Anticoagulation instructions were given to patient.  Results were reviewed/authorized by Weston Brass, PharmD.  He was notified by Weston Brass PharmD.         Prior Anticoagulation Instructions: INR 3.9  Do not take Coumadin today, Friday, November 4th. Then, take Coumadin 1 tab (5 mg) on  Sun, Tues, Thur, Sat and Coumadin 0.5 tab (2.5 mg) on Mon, Wed, Fri. Return to clinic in 3 weeks.   Current Anticoagulation Instructions: INR 2.8  Continue same dose of 1 tablet every day except 1/2 tablet on Monday, Wednesday and Friday.  Recheck INR in 4 weeks.

## 2010-09-29 NOTE — Assessment & Plan Note (Signed)
Summary: Cardiology Nuclear Study  Nuclear Med Background Indications for Stress Test: Evaluation for Ischemia   History: COPD, Echo, Myocardial Perfusion Study  History Comments: 10/08 MPS: EF=58%, Small decrease activity inferior apex. '09 Echo: EF=55-60%.  Symptoms: Chest Pain, SOB    Nuclear Pre-Procedure Cardiac Risk Factors: Carotid Disease, History of Smoking, Hypertension, Lipids, PVD, TIA Caffeine/Decaff Intake: none NPO After: 7:00 PM Lungs: Clear IV 0.9% NS with Angio Cath: 20g     IV Site: (R) AC IV Started by: Stanton Kidney EMT-P Chest Size (in) 42     Height (in): 67 Weight (lb): 174 BMI: 27.35  Nuclear Med Study 1 or 2 day study:  1 day     Stress Test Type:  Lexiscan Reading MD:  Olga Millers, MD     Referring MD:  Sonda Primes Resting Radionuclide:  Technetium 67m Tetrofosmin     Resting Radionuclide Dose:  11 mCi  Stress Radionuclide:  Technetium 52m Tetrofosmin     Stress Radionuclide Dose:  33 mCi   Stress Protocol      Max HR:  102 bpm Max Systolic BP: 135 mm HgRate Pressure Product:  54098  Lexiscan: 0.4 mg   Stress Test Technologist:  Irean Hong RN     Nuclear Technologist:  Domenic Polite CNMT  Rest Procedure  Myocardial perfusion imaging was performed at rest 45 minutes following the intravenous administration of Myoview Technetium 14m Tetrofosmin.  Stress Procedure  The patient received IV Lexiscan 0.4 mg over 15-seconds.   Myoview injected at 30-seconds. The patient complained of chest tightness.  There were no significant changes with infusion.  Quantitative spect images were obtained after a 45 minute delay.  QPS Raw Data Images:  Acuisition technically good; normal left ventricular size. Stress Images:  There is decreased uptake in the inferior wall and apex. Rest Images:  There is decreased uptake in the inferior wall and apex. Subtraction (SDS):  No evidence of ischemia. Transient Ischemic Dilatation:  1.12  (Normal <1.22)  Lung/Heart Ratio:  .24  (Normal <0.45)  Quantitative Gated Spect Images QGS EDV:  100 ml QGS ESV:  41 ml QGS EF:  59 % QGS cine images:  Normal wall motion.   Overall Impression  Exercise Capacity: Lexiscan study with no exercise. BP Response: Normal blood pressure response. ECG Impression: No significant ST segment change suggestive of ischemia. Overall Impression: There is inferior and apical thinning but  no ischemia. ,   Appended Document: Cardiology Nuclear Study pt aware of results

## 2010-09-29 NOTE — Progress Notes (Signed)
Summary: Douglas Stager pa  Phone Note Other Incoming   Summary of Call: The office rec'd letter notifying that Ventolin is not covered by [employer]. The preferred med is Proair. Would you like to change med or initiate prior authorization? Initial call taken by: Lucious Groves,  November 26, 2009 9:41 AM  Follow-up for Phone Call        OK Follow-up by: Tresa Garter MD,  November 26, 2009 1:28 PM    New/Updated Medications: PROAIR HFA 108 (90 BASE) MCG/ACT AERS (ALBUTEROL SULFATE) 2 inh qid as needed Prescriptions: PROAIR HFA 108 (90 BASE) MCG/ACT AERS (ALBUTEROL SULFATE) 2 inh qid as needed  #3 x 3   Entered and Authorized by:   Tresa Garter MD   Signed by:   Lamar Sprinkles, CMA on 11/26/2009   Method used:   Electronically to        [facility] [phone]* (retail)       [address]       [place], [place]  [address]       Ph: [phone]       Fax: [phone]   RxID:   [id number]

## 2010-09-29 NOTE — Assessment & Plan Note (Signed)
Summary: F/U #/CD   Vital Signs:  Patient profile:   75 year old male Weight:      176 pounds BMI:     27.67 Temp:     97.6 degrees F oral Pulse rate:   79 / minute BP sitting:   140 / 80  (left arm)  Vitals Entered By: Tora Perches (November 01, 2009 7:57 AM) CC: f/u Is Patient Diabetic? No   Primary Care Provider:  Dr. Posey Rea  CC:  f/u.  History of Present Illness: The patient presents for a follow up of hypertension, CVA, hyperlipidemia C/o periodic CPs on L - last 2 d ago at rest off and on   Preventive Screening-Counseling & Management  Alcohol-Tobacco     Smoking Status: quit  Current Medications (verified): 1)  Micardis 80 Mg Tabs (Telmisartan) .Marland Kitchen.. 1 By Mouth Qd 2)  Verapamil Hcl Cr 180 Mg  Cp24 (Verapamil Hcl) .Marland Kitchen.. 1 By Mouth Once Daily 3)  Omeprazole 20 Mg  Cpdr (Omeprazole) .Marland Kitchen.. 1 By Mouth Once Daily As Needed 4)  Vicodin 5-500 Mg  Tabs (Hydrocodone-Acetaminophen) .Marland Kitchen.. 1 Qid As Needed 5)  Flomax 0.4 Mg  Cp24 (Tamsulosin Hcl) .Marland Kitchen.. 1 Po Qd As Needed 6)  Levitra 20 Mg  Tabs (Vardenafil Hcl) .... 1/2-1 Once Daily As Needed 7)  Aspirin 81 Mg  Tbec (Aspirin) .... One By Mouth Every Day 8)  Vitamin D3 1000 Unit  Tabs (Cholecalciferol) .Marland Kitchen.. 1 Qd 9)  Ventolin Hfa 108 (90 Base) Mcg/act  Aers (Albuterol Sulfate) .... 2 Inh Qid Prn 10)  Triamcinolone Acetonide 0.5 % Crea (Triamcinolone Acetonide) .... Apply Bid To Affected Area 11)  Fluticasone Propionate 50 Mcg/act  Susp (Fluticasone Propionate) .... 2 Sprays Each Nostril Once Daily 12)  Loratadine 10 Mg  Tabs (Loratadine) .... Once Daily As Needed Allergies 13)  Alprazolam 0.5 Mg  Tabs (Alprazolam) .Marland Kitchen.. 1 By Mouth Two Times A Day As Needed Anxiety 14)  Coumadin 5 Mg Tabs (Warfarin Sodium) .... Take As Directed By Coumadin Clinic. 15)  Carvedilol 25 Mg Tabs (Carvedilol) .... 1/2 By Mouth Two Times A Day X 1 Wk, Then 1 By Mouth Bid  Allergies: 1)  ! Niacin (Niacin) 2)  ! * Ivp Dye 3)  ! Cozaar 4)  ! *  Ramipril 5)  ! * Lidoderm Patch 6)  ! Hydrochlorothiazide 7)  Ultram (Tramadol Hcl) 8)  Nyquil (Pseudoeph-Doxylamine-Dm-Apap) 9)  Ceftin 10)  Lovastatin (Lovastatin)  Past History:  Past Medical History: Last updated: 01/08/2009 Anticoagulation therapy: for previous sensory TIA and lupus anticoagulant GERD/erosive esophagitis Hypertension Hyperlipidemia Osteoarthritis -    right knee Cerebrovascular accident, hx of Low back pain Vit D def Nephrolithiasis Benign prostatic hypertrophy Peripheral vascular disease - carotids < 70% Colonic polyps, hx of Allergic rhinitis Hyperlipidemia E.D. TIA 2009  Family History: Last updated: 10/25/2007 Family History Hypertension grandparents with heart disease  Social History: Last updated: 10/25/2007 Retired Married Former Smoker Alcohol use-no  Review of Systems       The patient complains of chest pain.  The patient denies fever, dyspnea on exertion, abdominal pain, and melena.    Physical Exam  General:  pleasant and cooperative, in no acute distress Nose:  External nasal examination shows no deformity or inflammation. Nasal mucosa are pink and moist without lesions or exudates. Mouth:  WNL Neck:  supple, no thyromegaly, lymphodenopathy or masses Lungs:  clear bilaterally, no wheezes, rhonchi or crackles Heart:  RRR, no murmurs, rubs or gallops Abdomen:  soft,  non-tender with normal BS, no organomegalies or masses Msk:  No deformity or scoliosis noted of thoracic or lumbar spine.   Extremities:  No clubbing, cyanosis, edema, or deformity noted with normal full range of motion of all joints.   Neurologic:  No cranial nerve deficits noted. Station and gait are normal. Plantar reflexes are down-going bilaterally. DTRs are symmetrical throughout. Sensory, motor and coordinative functions appear intact. Skin:  Intact without suspicious lesions or rashes Psych:  Cognition and judgment appear intact. Alert and cooperative with  normal attention span and concentration. No apparent delusions, illusions, hallucinations   Impression & Recommendations:  Problem # 1:  CHEST PAIN UNSPECIFIED (ICD-786.50) atypical Assessment New  Card consult Dr Eden Emms  Orders: Cardiology Referral (Cardiology)  Problem # 2:  HYPERTENSION (ICD-401.9) Assessment: Comment Only  The following medications were removed from the medication list:    Carvedilol 25 Mg Tabs (Carvedilol) .Marland Kitchen... 1/2 by mouth two times a day x 1 wk, then 1 by mouth bid His updated medication list for this problem includes:    Micardis 80 Mg Tabs (Telmisartan) .Marland Kitchen... 1 by mouth qd    Verapamil Hcl Cr 180 Mg Cp24 (Verapamil hcl) .Marland Kitchen... 1 by mouth once daily    Clonidine Hcl 0.1 Mg Tabs (Clonidine hcl) .Marland Kitchen... 1 by mouth tid  Problem # 3:  HYPERLIPIDEMIA (ICD-272.4) Assessment: Unchanged Intol of statins  Problem # 4:  PERIPHERAL VASCULAR DISEASE (ICD-443.9) Assessment: Unchanged On coumadin  Problem # 5:  LEG PAIN (ICD-729.5) Assessment: Deteriorated  Complete Medication List: 1)  Micardis 80 Mg Tabs (Telmisartan) .Marland Kitchen.. 1 by mouth qd 2)  Verapamil Hcl Cr 180 Mg Cp24 (Verapamil hcl) .Marland Kitchen.. 1 by mouth once daily 3)  Omeprazole 20 Mg Cpdr (Omeprazole) .Marland Kitchen.. 1 by mouth once daily as needed 4)  Vicodin 5-500 Mg Tabs (Hydrocodone-acetaminophen) .Marland Kitchen.. 1 qid as needed 5)  Flomax 0.4 Mg Cp24 (Tamsulosin hcl) .Marland Kitchen.. 1 po qd as needed 6)  Levitra 20 Mg Tabs (Vardenafil hcl) .... 1/2-1 once daily as needed 7)  Aspirin 81 Mg Tbec (Aspirin) .... One by mouth every day 8)  Vitamin D3 1000 Unit Tabs (Cholecalciferol) .Marland Kitchen.. 1 qd 9)  Ventolin Hfa 108 (90 Base) Mcg/act Aers (Albuterol sulfate) .... 2 inh qid prn 10)  Triamcinolone Acetonide 0.5 % Crea (Triamcinolone acetonide) .... Apply bid to affected area 11)  Fluticasone Propionate 50 Mcg/act Susp (Fluticasone propionate) .... 2 sprays each nostril once daily 12)  Loratadine 10 Mg Tabs (Loratadine) .... Once daily as needed  allergies 13)  Alprazolam 0.5 Mg Tabs (Alprazolam) .Marland Kitchen.. 1 by mouth two times a day as needed anxiety 14)  Coumadin 5 Mg Tabs (Warfarin sodium) .... Take as directed by coumadin clinic. 15)  Clonidine Hcl 0.1 Mg Tabs (Clonidine hcl) .Marland Kitchen.. 1 by mouth tid  Patient Instructions: 1)  Please schedule a follow-up appointment in 3 months. 2)  BMP prior to visit, ICD-9:401.1 3)  Go to ER if chest hurts Prescriptions: ALPRAZOLAM 0.5 MG  TABS (ALPRAZOLAM) 1 by mouth two times a day as needed anxiety  #60 x 6   Entered and Authorized by:   Tresa Garter MD   Signed by:   Tresa Garter MD on 11/01/2009   Method used:   Print then Give to Patient   RxID:   [id number] VENTOLIN HFA 108 (90 BASE) MCG/ACT  AERS (ALBUTEROL SULFATE) 2 inh qid prn  #1 x 11   Entered and Authorized by:   Tresa Garter MD  Signed by:   Tresa Garter MD on 11/01/2009   Method used:   Print then Give to Patient   RxID:   [id number] FLUTICASONE PROPIONATE 50 MCG/ACT  SUSP (FLUTICASONE PROPIONATE) 2 sprays each nostril once daily  #1 x 6   Entered and Authorized by:   Tresa Garter MD   Signed by:   Tresa Garter MD on 11/01/2009   Method used:   Print then Give to Patient   RxID:   [id number] CLONIDINE HCL 0.1 MG TABS (CLONIDINE HCL) 1 by mouth tid  #90 x 12   Entered and Authorized by:   Tresa Garter MD   Signed by:   Tresa Garter MD on 11/01/2009   Method used:   Print then Give to Patient   RxID:   [id number]

## 2010-09-29 NOTE — Progress Notes (Signed)
Summary: Cipro  Phone Note Other Incoming   Caller: pt  Summary of Call: pt was told by Dr Macario Golds that he had UTI he wants rx sent to [facility] in [place] Initial call taken by: Ami Bullins CMA,  May 13, 2010 3:46 PM  Follow-up for Phone Call        Rx sent to [facility] Follow-up by: Lanier Prude, [facility]),  May 13, 2010 5:24 PM    New/Updated Medications: CIPRO 500 MG TABS (CIPROFLOXACIN HCL) 1 two times a day Prescriptions: CIPRO 500 MG TABS (CIPROFLOXACIN HCL) 1 two times a day  #20 x 0   Entered by:   Lanier Prude, CMA(AAMA)   Authorized by:   Tresa Garter MD   Signed by:   Lanier Prude, CMA(AAMA) on 05/13/2010   Method used:   Electronically to        [facility] [phone]* (retail)       [address]       [place], [place]  [address]       Ph: [phone]       Fax: [phone]   RxID:   [id number]

## 2010-09-29 NOTE — Assessment & Plan Note (Signed)
Summary: add on surgical clearance tooth abstraction/sl      Allergies Added:   Visit Type:  Pre-op Evaluation Referring Provider:  Dr. Toniann Fail, DDS Primary Provider:  Dr. Posey Rea  CC:  clearance for tooth surgery.  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  He was initially diagnosed in 2004 after suffering a stroke.  He has hypertension and hyperlipidemia.  Carotid Dopplers in March 2001 demonstrated 0-39% bilateral ICA stenosis.  He saw Dr. Eden Emms in March 2011 with complaints of chest pain.  He was enrolled in the promise trial and randomized to Myoview testing.  The nuclear study demonstrated an ejection fraction of 59%, inferior and apical thinning but no ischemia.  His last echocardiogram was in February 2009 and demonstrated normal LV function with an ejection fraction of 55-60%.  He was added on my schedule this morning for surgical clearance for a dental procedure.  He denies chest pain, shortness of breath, orthopnea, PND, edema or syncope.  Current Medications (verified): 1)  Verapamil Hcl Cr 180 Mg  Cp24 (Verapamil Hcl) .Marland Kitchen.. 1 By Mouth Once Daily 2)  Omeprazole 20 Mg  Cpdr (Omeprazole) .Marland Kitchen.. 1 By Mouth Once Daily As Needed 3)  Vicodin 5-500 Mg  Tabs (Hydrocodone-Acetaminophen) .Marland Kitchen.. 1 Qid As Needed 4)  Levitra 20 Mg  Tabs (Vardenafil Hcl) .... 1/2-1 Once Daily As Needed 5)  Aspirin 81 Mg  Tbec (Aspirin) .... One By Mouth Every Day 6)  Vitamin D3 1000 Unit  Tabs (Cholecalciferol) .Marland Kitchen.. 1 Qd 7)  Triamcinolone Acetonide 0.5 % Crea (Triamcinolone Acetonide) .... Apply Bid To Affected Area 8)  Fluticasone Propionate 50 Mcg/act  Susp (Fluticasone Propionate) .... 2 Sprays Each Nostril Once Daily 9)  Loratadine 10 Mg  Tabs (Loratadine) .... Once Daily As Needed Allergies 10)  Alprazolam 0.5 Mg  Tabs (Alprazolam)  .Marland Kitchen.. 1 By Mouth Two Times A Day As Needed Anxiety 11)  Coumadin 5 Mg Tabs (Warfarin Sodium) .... Take As Directed By Coumadin Clinic. 12)  Clonidine Hcl 0.1 Mg Tabs (Clonidine Hcl) .Marland Kitchen.. 1 By Mouth Tid 13)  Proair Hfa 108 (90 Base) Mcg/act Aers (Albuterol Sulfate) .... 2 Inh Qid As Needed 14)  Flomax 0.4 Mg Caps (Tamsulosin Hcl) .Marland Kitchen.. 1 By Mouth Once Daily 15)  Cozaar 100 Mg Tabs (Losartan Potassium) .Marland Kitchen.. 1 By Mouth Qd  Allergies (verified): 1)  ! Niacin (Niacin) 2)  ! * Ivp Dye 3)  ! Cozaar 4)  ! * Ramipril 5)  ! * Lidoderm Patch 6)  ! Hydrochlorothiazide 7)  Ultram (Tramadol Hcl) 8)  Nyquil (Pseudoeph-Doxylamine-Dm-Apap) 9)  Ceftin 10)  Lovastatin (Lovastatin)  Past History:  Past Medical History: Reviewed history from 01/08/2009 and no changes required. Anticoagulation therapy: for previous sensory TIA and lupus anticoagulant GERD/erosive esophagitis Hypertension Hyperlipidemia Osteoarthritis -    right knee Cerebrovascular accident, hx of Low back pain Vit D def Nephrolithiasis Benign prostatic hypertrophy Peripheral vascular disease - carotids < 70% Colonic polyps, hx of Allergic rhinitis Hyperlipidemia E.D. TIA 2009  Social History: Reviewed history from 10/25/2007 and no changes required. Retired Married Former Smoker Alcohol use-no  Review of Systems       As per  the HPI.  All other systems reviewed and negative.   Vital Signs:  Patient profile:   75 year old male Height:      67 inches  Weight:      175.50 pounds Pulse rate:   80 / minute BP sitting:   146 / 100  (left arm) Cuff size:   regular  Vitals Entered By: Micki Riley CNA (September 08, 2010 12:08 PM)  Physical Exam  General:  Well nourished, well developed, in no acute distress HEENT: normal Neck: no JVD Cardiac:  normal S1, S2; RRR; no murmur Lungs:  clear to auscultation bilaterally, no wheezing, rhonchi or rales Abd: soft, nontender, no hepatomegaly Ext: no clubbing, cyanosis  or edema Vascular: no carotid  bruits Skin: warm and dry Neuro:  CNs 2-12 intact, no focal abnormalities noted    Impression & Recommendations:  Problem # 1:  PRE-OPERATIVE CARDIOVASCULAR EXAMINATION (ICD-V72.81) He does not have any unstable cardiac conditions.  He is able to achieve 4 METS or greater.  He had a low risk myoview less than a year ago.  According to the ACC/AHA guidelines, he requires no further cardiac workup prior to his noncardiac procedure and should be acceptable risk.  He will, however, need to remain on Coumadin as outlined below.  Problem # 2:  HYPERCOAGULABLE STATE, PRIMARY (ICD-289.81) The patient originally presented with a stroke in 2004.  He is at high risk for recurrent stroke should he come off of Coumadin.  If his dental procedure cannot be done while on Coumadin, we will need to bridge him with Lovenox.  Lovenox bridging can be done through our coumadin clinic.  I discussed this in detail with the patient today.  I will send a copy of my note to his dentist to explain  Problem # 3:  HYPERTENSION (ICD-401.9) Elevated today.  He has not yet taken his medications.  Problem # 4:  HYPERLIPIDEMIA (ICD-272.4) Followed by PCP.

## 2010-09-29 NOTE — Medication Information (Signed)
Summary: Ventolin/[employer]  Ventolin/[employer]   Imported By: Sherian Rein 12/08/2009 14:34:35  _____________________________________________________________________  External Attachment:    Type:   Image     Comment:   External Document  Appended Document: Ventolin/[employer] MD aware and med has been changed. Signed doc.

## 2010-09-29 NOTE — Letter (Signed)
Summary: [facility]  [facility]   Imported By: Sherian Rein 08/26/2010 09:01:44  _____________________________________________________________________  External Attachment:    Type:   Image     Comment:   External Document

## 2010-09-29 NOTE — Letter (Signed)
Summary: [facility]   Imported By: Sherian Rein 01/13/2010 09:37:42  _____________________________________________________________________  External Attachment:    Type:   Image     Comment:   External Document

## 2010-09-29 NOTE — Medication Information (Signed)
Summary: [email or web address].mp  Anticoagulant Therapy  Managed by: Cloyde Reams, RN, BSN Referring MD: Sonda Primes PCP: Dr. Posey Rea Supervising MD: Jens Som MD, Arlys John Indication 1: Deep Venous Thrombosis (ICD-451.19) Indication 2: CVA-stroke (ICD-436) Lab Used: [facility] Site: [facility] INR POC 2.1 INR RANGE 2 - 3  Dietary changes: no    Health status changes: no    Bleeding/hemorrhagic complications: no    Recent/future hospitalizations: no    Any changes in medication regimen? no    Recent/future dental: no  Any missed doses?: no       Is patient compliant with meds? yes       Current Medications (verified): 1)  Micardis 80 Mg Tabs (Telmisartan) .Marland Kitchen.. 1 By Mouth Qd 2)  Verapamil Hcl Cr 180 Mg  Cp24 (Verapamil Hcl) .Marland Kitchen.. 1 By Mouth Once Daily 3)  Omeprazole 20 Mg  Cpdr (Omeprazole) .Marland Kitchen.. 1 By Mouth Once Daily As Needed 4)  Vicodin 5-500 Mg  Tabs (Hydrocodone-Acetaminophen) .Marland Kitchen.. 1 Qid As Needed 5)  Flomax 0.4 Mg  Cp24 (Tamsulosin Hcl) .Marland Kitchen.. 1 Po Qd As Needed 6)  Levitra 20 Mg  Tabs (Vardenafil Hcl) .... 1/2-1 Once Daily As Needed 7)  Aspirin 81 Mg  Tbec (Aspirin) .... One By Mouth Every Day 8)  Vitamin D3 1000 Unit  Tabs (Cholecalciferol) .Marland Kitchen.. 1 Qd 9)  Ventolin Hfa 108 (90 Base) Mcg/act  Aers (Albuterol Sulfate) .... 2 Inh Qid Prn 10)  Triamcinolone Acetonide 0.5 % Crea (Triamcinolone Acetonide) .... Apply Bid To Affected Area 11)  Fluticasone Propionate 50 Mcg/act  Susp (Fluticasone Propionate) .... 2 Sprays Each Nostril Once Daily 12)  Loratadine 10 Mg  Tabs (Loratadine) .... Once Daily As Needed Allergies 13)  Alprazolam 0.5 Mg  Tabs (Alprazolam) .Marland Kitchen.. 1 By Mouth Two Times A Day As Needed Anxiety 14)  Coumadin 5 Mg Tabs (Warfarin Sodium) .... Take As Directed By Coumadin Clinic. 15)  Carvedilol 25 Mg Tabs (Carvedilol) .... 1/2 By Mouth Two Times A Day X 1 Wk, Then 1 By Mouth Bid  Allergies (verified): 1)  ! Niacin (Niacin) 2)  ! * Ivp Dye 3)  ! Cozaar 4)  ! *  Ramipril 5)  ! * Lidoderm Patch 6)  ! Hydrochlorothiazide 7)  Ultram (Tramadol Hcl) 8)  Nyquil (Pseudoeph-Doxylamine-Dm-Apap) 9)  Ceftin 10)  Lovastatin (Lovastatin)  Anticoagulation Management History:      The patient is taking warfarin and comes in today for a routine follow up visit.  Positive risk factors for bleeding include an age of 73 years or older, history of CVA/TIA, and history of GI bleeding.  The bleeding index is 'high risk'.  Positive CHADS2 values include History of HTN and Prior Stroke/CVA/TIA.  Negative CHADS2 values include Age > 95 years old and History of Diabetes.  The start date was 06/22/2003.  Anticoagulation responsible provider: Jens Som MD, Arlys John.  INR POC: 2.1.  Cuvette Lot#: [id number].  Exp: 09/2010.    Anticoagulation Management Assessment/Plan:      The patient's current anticoagulation dose is Coumadin 5 mg tabs: Take as directed by coumadin clinic..  The target INR is 2 - 3.  The next INR is due 09/27/2009.  Anticoagulation instructions were given to patient.  Results were reviewed/authorized by Cloyde Reams, RN, BSN.  He was notified by Cloyde Reams RN.         Prior Anticoagulation Instructions: INR 2.7  Continue 1 tab on each Sunday, Tuesday, Thursday and 0.5 tab on all other days.    Recheck in 4  weeks.    Current Anticoagulation Instructions: INR 2.1  Continue on same dosage 1/2 tablet daily except 1 tablet on Sundays, Tuesdays, and Thursdays.  Recheck in 4 weeks.

## 2010-09-29 NOTE — Progress Notes (Signed)
  Phone Note Call from Patient   Caller: Patient Call For: CVRR Details for Reason: New med Summary of Call: Started Ciprofloxcin on Sunday two times a day for 10 days.  Initial call taken by: Bethena Midget, RN, BSN,  May 18, 2010 4:15 PM

## 2010-09-29 NOTE — Assessment & Plan Note (Signed)
Summary: np3/ CHEST PAIN UNSPECIFIED/jml      Allergies Added:   Primary Provider:  Dr. Posey Rea  CC:  dizziness and sob also chest pain. Marland Kitchen  History of Present Illness: Douglas [name] is seen today at the request of Dr Paulette Blanch.  He has had SSCP and a "bubbling" feeling in the left shoulder for the past year.  He has HTN, elevated lipids and a previous CVA with known 40-60% carotid disease.  I remember doing his TEE back in 2004.  The pain is atypical with no constant exertional component.  It can occur spontaneously. There is no associated SOB, palpitaoitns or diaphoresis.  He has not had a stress test  in over 5 years.  He has not had any prolonged episodes;.  The pain is left sided and radiates to the left arm.  I discussed the PROMISE Trial with him as he is an ideal candidate.  He is willing to proceed with this.  His resting HR is in the 60's and BMI is reasonable.  I would like to randomize him to CT or myovue.  Baseline ECG is normal  Problems Prior to Update: 1)  Erectile Dysfunction, Organic  (ICD-607.84) 2)  Tobacco Use, Quit  (ICD-V15.82) 3)  Constipation  (ICD-564.00) 4)  Flushing  (ICD-782.62) 5)  Chest Pain Unspecified  (ICD-786.50) 6)  Myalgia  (ICD-729.1) 7)  Transient Ischemic Attack  (ICD-435.9) 8)  Reactive Airway Disease  (ICD-493.90) 9)  Cough  (ICD-786.2) 10)  Tremor  (ICD-781.0) 11)  Fever Unspecified  (ICD-780.60) 12)  Headache  (ICD-784.0) 13)  Plantar Fasciitis, Bilateral  (ICD-728.71) 14)  Hematochezia  (ICD-578.1) 15)  Hyperlipidemia  (ICD-272.4) 16)  Allergic Rhinitis  (ICD-477.9) 17)  Colonic Polyps, Hx of  (ICD-V12.72) 18)  Sinusitis, Acute  (ICD-461.9) 19)  Muscle Pain  (ICD-729.1) 20)  Paresthesia  (ICD-782.0) 21)  Peripheral Vascular Disease  (ICD-443.9) 22)  Benign Prostatic Hypertrophy  (ICD-600.00) 23)  Actinic Keratosis  (ICD-702.0) 24)  Leg Pain  (ICD-729.5) 25)  Low Back Pain  (ICD-724.2) 26)  Hypercoagulable State, Primary  (ICD-289.81) 27)   Cerebrovascular Accident, Hx of  (ICD-V12.50) 28)  Dyspnea  (ICD-786.05) 29)  Kidney Stone  () 30)  Osteoarthritis  (ICD-715.90) 31)  Hypertension  (ICD-401.9) 32)  Gerd  (ICD-530.81) 33)  Anticoagulation Therapy  (ICD-V58.61)  Current Problems (verified): 1)  Erectile Dysfunction, Organic  (ICD-607.84) 2)  Tobacco Use, Quit  (ICD-V15.82) 3)  Constipation  (ICD-564.00) 4)  Flushing  (ICD-782.62) 5)  Chest Pain Unspecified  (ICD-786.50) 6)  Myalgia  (ICD-729.1) 7)  Transient Ischemic Attack  (ICD-435.9) 8)  Reactive Airway Disease  (ICD-493.90) 9)  Cough  (ICD-786.2) 10)  Tremor  (ICD-781.0) 11)  Fever Unspecified  (ICD-780.60) 12)  Headache  (ICD-784.0) 13)  Plantar Fasciitis, Bilateral  (ICD-728.71) 14)  Hematochezia  (ICD-578.1) 15)  Hyperlipidemia  (ICD-272.4) 16)  Allergic Rhinitis  (ICD-477.9) 17)  Colonic Polyps, Hx of  (ICD-V12.72) 18)  Sinusitis, Acute  (ICD-461.9) 19)  Muscle Pain  (ICD-729.1) 20)  Paresthesia  (ICD-782.0) 21)  Peripheral Vascular Disease  (ICD-443.9) 22)  Benign Prostatic Hypertrophy  (ICD-600.00) 23)  Actinic Keratosis  (ICD-702.0) 24)  Leg Pain  (ICD-729.5) 25)  Low Back Pain  (ICD-724.2) 26)  Hypercoagulable State, Primary  (ICD-289.81) 27)  Cerebrovascular Accident, Hx of  (ICD-V12.50) 28)  Dyspnea  (ICD-786.05) 29)  Kidney Stone  () 30)  Osteoarthritis  (ICD-715.90) 31)  Hypertension  (ICD-401.9) 32)  Gerd  (ICD-530.81) 33)  Anticoagulation Therapy  (ICD-V58.61)  Medications Prior to Update: 1)  Micardis 80 Mg Tabs (Telmisartan) .Marland Kitchen.. 1 By Mouth Qd 2)  Verapamil Hcl Cr 180 Mg  Cp24 (Verapamil Hcl) .Marland Kitchen.. 1 By Mouth Once Daily 3)  Omeprazole 20 Mg  Cpdr (Omeprazole) .Marland Kitchen.. 1 By Mouth Once Daily As Needed 4)  Vicodin 5-500 Mg  Tabs (Hydrocodone-Acetaminophen) .Marland Kitchen.. 1 Qid As Needed 5)  Flomax 0.4 Mg  Cp24 (Tamsulosin Hcl) .Marland Kitchen.. 1 Po Qd As Needed 6)  Levitra 20 Mg  Tabs (Vardenafil Hcl) .... 1/2-1 Once Daily As Needed 7)  Aspirin 81 Mg  Tbec  (Aspirin) .... One By Mouth Every Day 8)  Vitamin D3 1000 Unit  Tabs (Cholecalciferol) .Marland Kitchen.. 1 Qd 9)  Ventolin Hfa 108 (90 Base) Mcg/act  Aers (Albuterol Sulfate) .... 2 Inh Qid Prn 10)  Triamcinolone Acetonide 0.5 % Crea (Triamcinolone Acetonide) .... Apply Bid To Affected Area 11)  Fluticasone Propionate 50 Mcg/act  Susp (Fluticasone Propionate) .... 2 Sprays Each Nostril Once Daily 12)  Loratadine 10 Mg  Tabs (Loratadine) .... Once Daily As Needed Allergies 13)  Alprazolam 0.5 Mg  Tabs (Alprazolam) .Marland Kitchen.. 1 By Mouth Two Times A Day As Needed Anxiety 14)  Coumadin 5 Mg Tabs (Warfarin Sodium) .... Take As Directed By Coumadin Clinic. 15)  Clonidine Hcl 0.1 Mg Tabs (Clonidine Hcl) .Marland Kitchen.. 1 By Mouth Tid  Current Medications (verified): 1)  Micardis 80 Mg Tabs (Telmisartan) .Marland Kitchen.. 1 By Mouth Qd 2)  Verapamil Hcl Cr 180 Mg  Cp24 (Verapamil Hcl) .Marland Kitchen.. 1 By Mouth Once Daily 3)  Omeprazole 20 Mg  Cpdr (Omeprazole) .Marland Kitchen.. 1 By Mouth Once Daily As Needed 4)  Vicodin 5-500 Mg  Tabs (Hydrocodone-Acetaminophen) .Marland Kitchen.. 1 Qid As Needed 5)  Flomax 0.4 Mg  Cp24 (Tamsulosin Hcl) .Marland Kitchen.. 1 Po Qd As Needed 6)  Levitra 20 Mg  Tabs (Vardenafil Hcl) .... 1/2-1 Once Daily As Needed 7)  Aspirin 81 Mg  Tbec (Aspirin) .... One By Mouth Every Day 8)  Vitamin D3 1000 Unit  Tabs (Cholecalciferol) .Marland Kitchen.. 1 Qd 9)  Ventolin Hfa 108 (90 Base) Mcg/act  Aers (Albuterol Sulfate) .... 2 Inh Qid Prn 10)  Triamcinolone Acetonide 0.5 % Crea (Triamcinolone Acetonide) .... Apply Bid To Affected Area 11)  Fluticasone Propionate 50 Mcg/act  Susp (Fluticasone Propionate) .... 2 Sprays Each Nostril Once Daily 12)  Loratadine 10 Mg  Tabs (Loratadine) .... Once Daily As Needed Allergies 13)  Alprazolam 0.5 Mg  Tabs (Alprazolam) .Marland Kitchen.. 1 By Mouth Two Times A Day As Needed Anxiety 14)  Coumadin 5 Mg Tabs (Warfarin Sodium) .... Take As Directed By Coumadin Clinic. 15)  Clonidine Hcl 0.1 Mg Tabs (Clonidine Hcl) .Marland Kitchen.. 1 By Mouth Tid  Allergies (verified): 1)   ! Niacin (Niacin) 2)  ! * Ivp Dye 3)  ! Cozaar 4)  ! * Ramipril 5)  ! * Lidoderm Patch 6)  ! Hydrochlorothiazide 7)  Ultram (Tramadol Hcl) 8)  Nyquil (Pseudoeph-Doxylamine-Dm-Apap) 9)  Ceftin 10)  Lovastatin (Lovastatin)  Past History:  Past Medical History: Last updated: 01/08/2009 Anticoagulation therapy: for previous sensory TIA and lupus anticoagulant GERD/erosive esophagitis Hypertension Hyperlipidemia Osteoarthritis -    right knee Cerebrovascular accident, hx of Low back pain Vit D def Nephrolithiasis Benign prostatic hypertrophy Peripheral vascular disease - carotids < 70% Colonic polyps, hx of Allergic rhinitis Hyperlipidemia E.D. TIA 2009  Past Surgical History: Last updated: 01/08/2009 s/p lithotripsy s/p sinus surgury Rotator cuff repair - x 2 s/p lung biopsy 1980  Family History: Last updated: 10/25/2007 Family History  Hypertension grandparents with heart disease  Social History: Last updated: 10/25/2007 Retired Married Former Smoker Alcohol use-no  Review of Systems       Denies fever, malais, weight loss, blurry vision, decreased visual acuity, cough, sputum, SOB, hemoptysis, pleuritic pain, palpitaitons, heartburn, abdominal pain, melena, lower extremity edema, claudication, or rash. All other systems reviewed and negative  Vital Signs:  Patient profile:   75 year old male Height:      67 inches Weight:      176 pounds BMI:     27.67 Pulse rate:   63 / minute Resp:     12 per minute BP sitting:   137 / 70  (left arm)  Vitals Entered By: Kem Parkinson (November 16, 2009 9:13 AM)  Physical Exam  General:  Affect appropriate Healthy:  appears stated age HEENT: normal Neck supple with no adenopathy JVP normal no bruits no thyromegaly Lungs clear with no wheezing and good diaphragmatic motion Heart:  S1/S2 no murmur,rub, gallop or click PMI normal Abdomen: benighn, BS positve, no tenderness, no AAA no bruit.  No HSM or  HJR Distal pulses intact with no bruits No edema Neuro non-focal Skin warm and dry    Impression & Recommendations:  Problem # 1:  CHEST PAIN UNSPECIFIED (ICD-786.50)  Multiple risk factors and known carotid disease.  PROMISE TRIAL discussed and willing to be enrolled.  Discussed with research nurses His updated medication list for this problem includes:    Verapamil Hcl Cr 180 Mg Cp24 (Verapamil hcl) .Marland Kitchen... 1 by mouth once daily    Aspirin 81 Mg Tbec (Aspirin) ..... One by mouth every day    Coumadin 5 Mg Tabs (Warfarin sodium) .Marland Kitchen... Take as directed by coumadin clinic.  Orders: Nuclear Stress Test (Nuc Stress Test)  Problem # 2:  TRANSIENT ISCHEMIC ATTACK (ICD-435.9) F/U carotid duplex.  Continue anticoagulation for hypercoagulable syndrome.    Problem # 3:  HYPERLIPIDEMIA (ICD-272.4) Diet conult.  Target LDL should be 70 with known vascular disease.  F/U Plot[facility].  Consdider starting statin pending results of duplex and  CT/myovue CHOL: 182 (10/25/2009)   LDL: 109 (10/25/2009)   HDL: 39.90 (10/25/2009)   TG: 166.0 (10/25/2009)  Problem # 4:  HYPERCOAGULABLE STATE, PRIMARY (ICD-289.81) Continue coumadin.  F/U clinic no bleeding problems  Problem # 5:  HYPERTENSION (ICD-401.9) Well controlled His updated medication list for this problem includes:    Micardis 80 Mg Tabs (Telmisartan) .Marland Kitchen... 1 by mouth qd    Verapamil Hcl Cr 180 Mg Cp24 (Verapamil hcl) .Marland Kitchen... 1 by mouth once daily    Aspirin 81 Mg Tbec (Aspirin) ..... One by mouth every day    Clonidine Hcl 0.1 Mg Tabs (Clonidine hcl) .Marland Kitchen... 1 by mouth tid  Other Orders: Carotid Duplex (Carotid Duplex)  Patient Instructions: 1)  Your physician recommends that you schedule a follow-up appointment in: 6 MONTHS 2)  Your physician has requested that you have a carotid duplex. This test is an ultrasound of the carotid arteries in your neck. It looks at blood flow through these arteries that supply the brain with blood. Allow one  hour for this exam. There are no restrictions or special instructions. 3)  Your physician has requested that you have an exercise stress myoview.  For further information please visit [email or web address]  Please follow instruction sheet, as given.   EKG Report  Procedure date:  11/16/2009  Findings:      NSR 63 Normal ECG

## 2010-09-29 NOTE — Medication Information (Signed)
Summary: rov/sl   Anticoagulant Therapy  Managed by: Reina Fuse, PharmD Referring MD: Sonda Primes PCP: Dr. Posey Rea Supervising MD: Shirlee Latch MD,Dalton Indication 1: Deep Venous Thrombosis (ICD-451.19) Indication 2: CVA-stroke (ICD-436) Lab Used: [facility] Site: [facility] INR POC 2.4 INR RANGE 2 - 3  Dietary changes: no    Health status changes: no    Bleeding/hemorrhagic complications: no    Recent/future hospitalizations: no    Any changes in medication regimen? no    Recent/future dental: no  Any missed doses?: no       Is patient compliant with meds? yes       Allergies: 1)  ! Niacin (Niacin) 2)  ! * Ivp Dye 3)  ! Cozaar 4)  ! * Ramipril 5)  ! * Lidoderm Patch 6)  ! Hydrochlorothiazide 7)  Ultram (Tramadol Hcl) 8)  Nyquil (Pseudoeph-Doxylamine-Dm-Apap) 9)  Ceftin 10)  Lovastatin (Lovastatin)  Anticoagulation Management History:      The patient is taking warfarin and comes in today for a routine follow up visit.  Positive risk factors for bleeding include an age of 60 years or older, history of CVA/TIA, and history of GI bleeding.  The bleeding index is 'high risk'.  Positive CHADS2 values include History of HTN and Prior Stroke/CVA/TIA.  Negative CHADS2 values include Age > 49 years old and History of Diabetes.  The start date was 06/22/2003.  Anticoagulation responsible provider: Shirlee Latch MD,Dalton.  INR POC: 2.4.  Cuvette Lot#: [id number].  Exp: 05/2011.    Anticoagulation Management Assessment/Plan:      The patient's current anticoagulation dose is Coumadin 5 mg tabs: Take as directed by coumadin clinic..  The target INR is 2 - 3.  The next INR is due 06/07/2010.  Anticoagulation instructions were given to patient.  Results were reviewed/authorized by Reina Fuse, PharmD.         Prior Anticoagulation Instructions: INR 1.9  Take Coumadin 0.5 tab (2.5 mg) on Mon, Fri and Coumadin 1 tab (5 mg) on all other days.  Return to clinic in 2 weeks.     Current Anticoagulation Instructions: INR 2.4 No changes to your medicine. Keep taking current doses of 1 tablet everyday except 1/2 tablet on monday and friday.

## 2010-09-29 NOTE — Letter (Signed)
[facility]   Imported By: Sherian Rein 03/29/2010 14:12:12  _____________________________________________________________________  External Attachment:    Type:   Image     Comment:   External Document

## 2010-09-29 NOTE — Medication Information (Signed)
Summary: rov/tm   Anticoagulant Therapy  Managed by: Lyna Poser, PharmD Referring MD: Sonda Primes PCP: Dr. Posey Rea Supervising MD: Myrtis Ser MD,Latanza Pfefferkorn Indication 1: Deep Venous Thrombosis (ICD-451.19) Indication 2: CVA-stroke (ICD-436) Lab Used: [facility] Site: [facility] INR POC 4.1 INR RANGE 2 - 3  Dietary changes: no    Health status changes: no    Bleeding/hemorrhagic complications: no    Recent/future hospitalizations: no    Any changes in medication regimen? yes       Details: started cipro on sunday for 10 days  Recent/future dental: no  Any missed doses?: no       Is patient compliant with meds? yes      Comments: already took dose today  Allergies: 1)  ! Niacin (Niacin) 2)  ! * Ivp Dye 3)  ! Cozaar 4)  ! * Ramipril 5)  ! * Lidoderm Patch 6)  ! Hydrochlorothiazide 7)  Ultram (Tramadol Hcl) 8)  Nyquil (Pseudoeph-Doxylamine-Dm-Apap) 9)  Ceftin 10)  Lovastatin (Lovastatin)  Anticoagulation Management History:      The patient is taking warfarin and comes in today for a routine follow up visit.  Positive risk factors for bleeding include an age of 5 years or older, history of CVA/TIA, and history of GI bleeding.  The bleeding index is 'high risk'.  Positive CHADS2 values include History of HTN and Prior Stroke/CVA/TIA.  Negative CHADS2 values include Age > 53 years old and History of Diabetes.  The start date was 06/22/2003.  Anticoagulation responsible provider: Myrtis Ser MD,Jomayra Novitsky.  INR POC: 4.1.  Exp: 05/2011.    Anticoagulation Management Assessment/Plan:      The patient's current anticoagulation dose is Coumadin 5 mg tabs: Take as directed by coumadin clinic..  The target INR is 2 - 3.  The next INR is due 05/27/2010.  Anticoagulation instructions were given to patient.  Results were reviewed/authorized by Lyna Poser, PharmD.  He was notified by Lyna Poser PharmD.         Prior Anticoagulation Instructions: INR 2.4 No changes to your  medicine. Keep taking current doses of 1 tablet everyday except 1/2 tablet on monday and friday.   Current Anticoagulation Instructions: INR 4.1   Skip your dose of coumadin tomorrow then decrease dose to 1/2 tablet every day except 1 tablet on Tuesday, Thursday and Saturday.  Recheck INR in 1 week.

## 2010-09-29 NOTE — Assessment & Plan Note (Signed)
Summary: 3 MO ROV /NWS  #   Vital Signs:  Patient profile:   75 year old male Height:      67 inches Weight:      178 pounds BMI:     27.98 O2 Sat:      96 % on Room air Temp:     97.3 degrees F oral Pulse rate:   74 / minute BP sitting:   128 / 72  (left arm) Cuff size:   regular  Vitals Entered By: Lucious Groves (February 07, 2010 8:51 AM)  O2 Flow:  Room air CC: 3 mo rtn ov./kb Is Patient Diabetic? No Pain Assessment Patient in pain? no      Comments Patient notes that he needs refill of Loratadine./kb   Primary Care Provider:  Dr. Posey Rea  CC:  3 mo rtn ov./kb.  History of Present Illness: The patient presents for a follow up of hypertension, CVD,  hyperlipidemia   Current Medications (verified): 1)  Micardis 80 Mg Tabs (Telmisartan) .Marland Kitchen.. 1 By Mouth Qd 2)  Verapamil Hcl Cr 180 Mg  Cp24 (Verapamil Hcl) .Marland Kitchen.. 1 By Mouth Once Daily 3)  Omeprazole 20 Mg  Cpdr (Omeprazole) .Marland Kitchen.. 1 By Mouth Once Daily As Needed 4)  Vicodin 5-500 Mg  Tabs (Hydrocodone-Acetaminophen) .Marland Kitchen.. 1 Qid As Needed 5)  Flomax 0.4 Mg  Cp24 (Tamsulosin Hcl) .Marland Kitchen.. 1 Po Qd As Needed 6)  Levitra 20 Mg  Tabs (Vardenafil Hcl) .... 1/2-1 Once Daily As Needed 7)  Aspirin 81 Mg  Tbec (Aspirin) .... One By Mouth Every Day 8)  Vitamin D3 1000 Unit  Tabs (Cholecalciferol) .Marland Kitchen.. 1 Qd 9)  Ventolin Hfa 108 (90 Base) Mcg/act  Aers (Albuterol Sulfate) .... 2 Inh Qid Prn 10)  Triamcinolone Acetonide 0.5 % Crea (Triamcinolone Acetonide) .... Apply Bid To Affected Area 11)  Fluticasone Propionate 50 Mcg/act  Susp (Fluticasone Propionate) .... 2 Sprays Each Nostril Once Daily 12)  Loratadine 10 Mg  Tabs (Loratadine) .... Once Daily As Needed Allergies 13)  Alprazolam 0.5 Mg  Tabs (Alprazolam) .Marland Kitchen.. 1 By Mouth Two Times A Day As Needed Anxiety 14)  Coumadin 5 Mg Tabs (Warfarin Sodium) .... Take As Directed By Coumadin Clinic. 15)  Clonidine Hcl 0.1 Mg Tabs (Clonidine Hcl) .Marland Kitchen.. 1 By Mouth Tid 16)  Proair Hfa 108 (90 Base)  Mcg/act Aers (Albuterol Sulfate) .... 2 Inh Qid As Needed  Allergies (verified): 1)  ! Niacin (Niacin) 2)  ! * Ivp Dye 3)  ! Cozaar 4)  ! * Ramipril 5)  ! * Lidoderm Patch 6)  ! Hydrochlorothiazide 7)  Ultram (Tramadol Hcl) 8)  Nyquil (Pseudoeph-Doxylamine-Dm-Apap) 9)  Ceftin 10)  Lovastatin (Lovastatin)  Past History:  Past Medical History: Last updated: 01/08/2009 Anticoagulation therapy: for previous sensory TIA and lupus anticoagulant GERD/erosive esophagitis Hypertension Hyperlipidemia Osteoarthritis -    right knee Cerebrovascular accident, hx of Low back pain Vit D def Nephrolithiasis Benign prostatic hypertrophy Peripheral vascular disease - carotids < 70% Colonic polyps, hx of Allergic rhinitis Hyperlipidemia E.D. TIA 2009  Past Surgical History: Last updated: 01/08/2009 s/p lithotripsy s/p sinus surgury Rotator cuff repair - x 2 s/p lung biopsy 1980  Social History: Last updated: 10/25/2007 Retired Married Former Smoker Alcohol use-no  Physical Exam  General:  pleasant and cooperative, in no acute distress Nose:  External nasal examination shows no deformity or inflammation. Nasal mucosa are pink and moist without lesions or exudates. Mouth:  4 mm roung growth on tip of tongue Lungs:  clear bilaterally, no wheezes, rhonchi or crackles Heart:  RRR, no murmurs, rubs or gallops Abdomen:  soft, non-tender with normal BS, no organomegalies or masses Msk:  No deformity or scoliosis noted of thoracic or lumbar spine.   Neurologic:  No cranial nerve deficits noted. Station and gait are normal. Plantar reflexes are down-going bilaterally. DTRs are symmetrical throughout. Sensory, motor and coordinative functions appear intact. Skin:  Intact without suspicious lesions or rashes Psych:  Cognition and judgment appear intact. Alert and cooperative with normal attention span and concentration. No apparent delusions, illusions, hallucinations   Impression &  Recommendations:  Problem # 1:  HYPERTENSION (ICD-401.9) Assessment Unchanged  His updated medication list for this problem includes:    Micardis 80 Mg Tabs (Telmisartan) .Marland Kitchen... 1 by mouth qd    Verapamil Hcl Cr 180 Mg Cp24 (Verapamil hcl) .Marland Kitchen... 1 by mouth once daily    Clonidine Hcl 0.1 Mg Tabs (Clonidine hcl) .Marland Kitchen... 1 by mouth tid The labs were reviewed with the patient.   Problem # 2:  OSTEOARTHRITIS (ICD-715.90) Assessment: Unchanged  His updated medication list for this problem includes:    Vicodin 5-500 Mg Tabs (Hydrocodone-acetaminophen) .Marland Kitchen... 1 qid as needed    Aspirin 81 Mg Tbec (Aspirin) ..... One by mouth every day  Problem # 3:  LOW BACK PAIN (ICD-724.2) Assessment: Unchanged  His updated medication list for this problem includes:    Vicodin 5-500 Mg Tabs (Hydrocodone-acetaminophen) .Marland Kitchen... 1 qid as needed    Aspirin 81 Mg Tbec (Aspirin) ..... One by mouth every day  Problem # 4:  PERIPHERAL VASCULAR DISEASE (ICD-443.9) Assessment: Unchanged  Problem # 5:  TONGUE DISORDER (ICD-529.9)  a growth Assessment: New Dentist said it is ok ENT cons if issues  Complete Medication List: 1)  Micardis 80 Mg Tabs (Telmisartan) .Marland Kitchen.. 1 by mouth qd 2)  Verapamil Hcl Cr 180 Mg Cp24 (Verapamil hcl) .Marland Kitchen.. 1 by mouth once daily 3)  Omeprazole 20 Mg Cpdr (Omeprazole) .Marland Kitchen.. 1 by mouth once daily as needed 4)  Vicodin 5-500 Mg Tabs (Hydrocodone-acetaminophen) .Marland Kitchen.. 1 qid as needed 5)  Flomax 0.4 Mg Cp24 (Tamsulosin hcl) .Marland Kitchen.. 1 po qd as needed 6)  Levitra 20 Mg Tabs (Vardenafil hcl) .... 1/2-1 once daily as needed 7)  Aspirin 81 Mg Tbec (Aspirin) .... One by mouth every day 8)  Vitamin D3 1000 Unit Tabs (Cholecalciferol) .Marland Kitchen.. 1 qd 9)  Ventolin Hfa 108 (90 Base) Mcg/act Aers (Albuterol sulfate) .... 2 inh qid prn 10)  Triamcinolone Acetonide 0.5 % Crea (Triamcinolone acetonide) .... Apply bid to affected area 11)  Fluticasone Propionate 50 Mcg/act Susp (Fluticasone propionate) .... 2 sprays  each nostril once daily 12)  Loratadine 10 Mg Tabs (Loratadine) .... Once daily as needed allergies 13)  Alprazolam 0.5 Mg Tabs (Alprazolam) .Marland Kitchen.. 1 by mouth two times a day as needed anxiety 14)  Coumadin 5 Mg Tabs (Warfarin sodium) .... Take as directed by coumadin clinic. 15)  Clonidine Hcl 0.1 Mg Tabs (Clonidine hcl) .Marland Kitchen.. 1 by mouth tid 16)  Proair Hfa 108 (90 Base) Mcg/act Aers (Albuterol sulfate) .... 2 inh qid as needed  Other Orders: Prescription Created Electronically [id number])  Patient Instructions: 1)  Please schedule a follow-up appointment in 3 months well w/labs v70.0 Prescriptions: LORATADINE 10 MG  TABS (LORATADINE) once daily as needed allergies  #30 x 12   Entered and Authorized by:   Tresa Garter MD   Signed by:   Tresa Garter MD on 02/07/2010  Method used:   Electronically to        [facility] [phone]* (retail)       [address]       [place], [place]  [address]       Ph: [phone]       Fax: [phone]   RxID:   [id number]

## 2010-09-29 NOTE — Letter (Signed)
Summary: [facility]  [facility]   Imported By: Lester Grandfield 05/30/2010 09:12:15  _____________________________________________________________________  External Attachment:    Type:   Image     Comment:   External Document

## 2010-09-29 NOTE — Medication Information (Signed)
Summary: rov/sp   Anticoagulant Therapy  Managed by: Weston Brass, PharmD Referring MD: Sonda Primes PCP: Dr. Posey Rea Supervising MD: Jens Som MD, Arlys John Indication 1: Deep Venous Thrombosis (ICD-451.19) Indication 2: CVA-stroke (ICD-436) Lab Used: [facility] Site: [facility] INR POC 2.8 INR RANGE 2 - 3  Dietary changes: no    Health status changes: no    Bleeding/hemorrhagic complications: yes       Details: some small nose bleeds  Recent/future hospitalizations: no    Any changes in medication regimen? no    Recent/future dental: yes     Details: having to have tooth pulled in near future.  No date set yet  Any missed doses?: no       Is patient compliant with meds? yes       Allergies: 1)  ! Niacin (Niacin) 2)  ! * Ivp Dye 3)  ! Cozaar 4)  ! * Ramipril 5)  ! * Lidoderm Patch 6)  ! Hydrochlorothiazide 7)  Ultram (Tramadol Hcl) 8)  Nyquil (Pseudoeph-Doxylamine-Dm-Apap) 9)  Ceftin 10)  Lovastatin (Lovastatin)  Anticoagulation Management History:      The patient is taking warfarin and comes in today for a routine follow up visit.  Positive risk factors for bleeding include an age of 12 years or older, history of CVA/TIA, and history of GI bleeding.  The bleeding index is 'high risk'.  Positive CHADS2 values include History of HTN and Prior Stroke/CVA/TIA.  Negative CHADS2 values include Age > 10 years old and History of Diabetes.  The start date was 06/22/2003.  Anticoagulation responsible provider: Jens Som MD, Arlys John.  INR POC: 2.8.  Cuvette Lot#: [id number].  Exp: 08/2011.    Anticoagulation Management Assessment/Plan:      The patient's current anticoagulation dose is Coumadin 5 mg tabs: Take as directed by coumadin clinic..  The target INR is 2 - 3.  The next INR is due 09/15/2010.  Anticoagulation instructions were given to patient.  Results were reviewed/authorized by Weston Brass, PharmD.  He was notified by Weston Brass PharmD.         Prior Anticoagulation  Instructions: INR 2.8  Continue same dose of 1 tablet every day except 1/2 tablet on Monday, Wednesday and Friday.  Recheck INR in 4 weeks.   Current Anticoagulation Instructions: Same as Prior Instructions.

## 2010-09-29 NOTE — Medication Information (Signed)
Summary: rov/sp   Anticoagulant Therapy  Managed by: Weston Brass, PharmD Referring MD: Sonda Primes PCP: Dr. Posey Rea Supervising MD: Tenny Craw MD, Gunnar Fusi Indication 1: Deep Venous Thrombosis (ICD-451.19) Indication 2: CVA-stroke (ICD-436) Lab Used: [facility] Site: [facility] INR POC 3.3 INR RANGE 2 - 3  Dietary changes: no    Health status changes: no    Bleeding/hemorrhagic complications: no    Recent/future hospitalizations: no    Any changes in medication regimen? no    Recent/future dental: yes     Details: Tooth extraction next Monday  Any missed doses?: no       Is patient compliant with meds? yes       Allergies: 1)  ! Niacin (Niacin) 2)  ! * Ivp Dye 3)  ! Cozaar 4)  ! * Ramipril 5)  ! * Lidoderm Patch 6)  ! Hydrochlorothiazide 7)  Ultram (Tramadol Hcl) 8)  Nyquil (Pseudoeph-Doxylamine-Dm-Apap) 9)  Ceftin 10)  Lovastatin (Lovastatin)  Anticoagulation Management History:      The patient is taking warfarin and comes in today for a routine follow up visit.  Positive risk factors for bleeding include an age of 73 years or older, history of CVA/TIA, and history of GI bleeding.  The bleeding index is 'high risk'.  Positive CHADS2 values include History of HTN and Prior Stroke/CVA/TIA.  Negative CHADS2 values include Age > 49 years old and History of Diabetes.  The start date was 06/22/2003.  Anticoagulation responsible provider: Tenny Craw MD, Gunnar Fusi.  INR POC: 3.3.  Cuvette Lot#: [id number].  Exp: 08/2011.    Anticoagulation Management Assessment/Plan:      The patient's current anticoagulation dose is Coumadin 5 mg tabs: Take as directed by coumadin clinic..  The target INR is 2 - 3.  The next INR is due 10/06/2010.  Anticoagulation instructions were given to patient.  Results were reviewed/authorized by Weston Brass, PharmD.  He was notified by Linward Headland, PharmD candidate.         Prior Anticoagulation Instructions: INR 2.8  Continue same dose of 1 tablet every day  except 1/2 tablet on Monday, Wednesday and Friday.  Recheck INR in 4 weeks.   Current Anticoagulation Instructions: INR 3.3 (INR goal: 2-3)  Skip today's dose.  Resume normal schedule of 1 tablet on Sundays, Tuesdays, Thursdays, and Saturdays, and 1/2 tablet on Mondays, Wednesdays, and Fridays.  Recheck in 3 weeks.

## 2010-09-29 NOTE — Medication Information (Signed)
Summary: rov/eac  Anticoagulant Therapy  Managed by: Weston Brass, PharmD Referring MD: Sonda Primes PCP: Dr. Posey Rea Supervising MD: Myrtis Ser MD, Tinnie Gens Indication 1: Deep Venous Thrombosis (ICD-451.19) Indication 2: CVA-stroke (ICD-436) Lab Used: [facility] Site: [facility] INR POC 2.6 INR RANGE 2 - 3  Dietary changes: no    Health status changes: no    Bleeding/hemorrhagic complications: no    Recent/future hospitalizations: no    Any changes in medication regimen? no    Recent/future dental: no  Any missed doses?: no       Is patient compliant with meds? yes       Allergies: 1)  ! Niacin (Niacin) 2)  ! * Ivp Dye 3)  ! Cozaar 4)  ! * Ramipril 5)  ! * Lidoderm Patch 6)  ! Hydrochlorothiazide 7)  Ultram (Tramadol Hcl) 8)  Nyquil (Pseudoeph-Doxylamine-Dm-Apap) 9)  Ceftin 10)  Lovastatin (Lovastatin)  Anticoagulation Management History:      The patient is taking warfarin and comes in today for a routine follow up visit.  Positive risk factors for bleeding include an age of 33 years or older, history of CVA/TIA, and history of GI bleeding.  The bleeding index is 'high risk'.  Positive CHADS2 values include History of HTN and Prior Stroke/CVA/TIA.  Negative CHADS2 values include Age > 74 years old and History of Diabetes.  The start date was 06/22/2003.  Anticoagulation responsible provider: Myrtis Ser MD, Tinnie Gens.  INR POC: 2.6.  Cuvette Lot#: [id number].  Exp: 12/2010.    Anticoagulation Management Assessment/Plan:      The patient's current anticoagulation dose is Coumadin 5 mg tabs: Take as directed by coumadin clinic..  The target INR is 2 - 3.  The next INR is due 01/20/2010.  Anticoagulation instructions were given to patient.  Results were reviewed/authorized by Weston Brass, PharmD.  He was notified by Weston Brass PharmD.         Prior Anticoagulation Instructions: INR 2.3  Continue taking 1 tablet on Sunday, Tuesday, and thursday, and 1/2 tablet all other days.   Return to clinic in 4 weeks.   Current Anticoagulation Instructions: INR 2.6  Continue same dose of 1/2 tablet every day except 1 tablet on Sunday, Tuesday, and Thursday

## 2010-10-04 ENCOUNTER — Encounter: Payer: Self-pay | Admitting: Cardiovascular Disease

## 2010-10-04 ENCOUNTER — Ambulatory Visit (INDEPENDENT_AMBULATORY_CARE_PROVIDER_SITE_OTHER): Payer: Medicare Other | Admitting: Cardiovascular Disease

## 2010-10-04 DIAGNOSIS — I10 Essential (primary) hypertension: Secondary | ICD-10-CM

## 2010-10-04 DIAGNOSIS — E785 Hyperlipidemia, unspecified: Secondary | ICD-10-CM

## 2010-10-06 ENCOUNTER — Encounter: Payer: Self-pay | Admitting: Cardiovascular Disease

## 2010-10-06 ENCOUNTER — Encounter (INDEPENDENT_AMBULATORY_CARE_PROVIDER_SITE_OTHER): Payer: Medicare Other

## 2010-10-06 DIAGNOSIS — I80299 Phlebitis and thrombophlebitis of other deep vessels of unspecified lower extremity: Secondary | ICD-10-CM

## 2010-10-06 DIAGNOSIS — Z7901 Long term (current) use of anticoagulants: Secondary | ICD-10-CM

## 2010-10-13 NOTE — Medication Information (Addendum)
Summary: Coumadin Clinic  Anticoagulant Therapy  Managed by: Weston Brass, PharmD Referring MD: Sonda Primes PCP: Dr. Posey Rea Supervising MD: Nary Sneed Indication 1: Deep Venous Thrombosis (ICD-451.19) Indication 2: CVA-stroke (ICD-436) Lab Used: [facility] Site: [facility] INR POC 2.6 INR RANGE 2 - 3  Dietary changes: no    Health status changes: no    Bleeding/hemorrhagic complications: no    Recent/future hospitalizations: no    Any changes in medication regimen? no    Recent/future dental: no  Any missed doses?: no       Is patient compliant with meds? yes       Allergies: 1)  ! Niacin (Niacin) 2)  ! * Ivp Dye 3)  ! Cozaar 4)  ! * Ramipril 5)  ! * Lidoderm Patch 6)  ! Hydrochlorothiazide 7)  Ultram (Tramadol Hcl) 8)  Nyquil (Pseudoeph-Doxylamine-Dm-Apap) 9)  Ceftin 10)  Lovastatin (Lovastatin)  Anticoagulation Management History:      The patient is taking warfarin and comes in today for a routine follow up visit.  Positive risk factors for bleeding include an age of 73 years or older, history of CVA/TIA, and history of GI bleeding.  The bleeding index is 'high risk'.  Positive CHADS2 values include History of HTN and Prior Stroke/CVA/TIA.  Negative CHADS2 values include Age > 17 years old and History of Diabetes.  The start date was 06/22/2003.  Anticoagulation responsible provider: Quinnten Calvin.  INR POC: 2.6.  Cuvette Lot#: [id number].  Exp: 08/2011.    Anticoagulation Management Assessment/Plan:      The patient's current anticoagulation dose is Coumadin 5 mg tabs: Take as directed by coumadin clinic..  The target INR is 2 - 3.  The next INR is due 10/28/2010.  Anticoagulation instructions were given to patient.  Results were reviewed/authorized by Weston Brass, PharmD.  He was notified by Margot Chimes PharmD Candidate.         Prior Anticoagulation Instructions: INR 3.3 (INR goal: 2-3)  Skip today's dose.  Resume normal schedule of 1 tablet on Sundays,  Tuesdays, Thursdays, and Saturdays, and 1/2 tablet on Mondays, Wednesdays, and Fridays.  Recheck in 3 weeks.  Current Anticoagulation Instructions: INR 2.6  Continue your current dose of 1 tablet everyday except 1/2 tablet on Mondays, Wednesdays, and Fridays.

## 2010-10-13 NOTE — Assessment & Plan Note (Signed)
Summary: f65m/rs per pt/bumplist=mj  Medications Added MICARDIS 80 MG TABS (TELMISARTAN) one tablet by mouth once daily      Allergies Added:   Referring Provider:  Dr. Toniann Fail, DDS Primary Provider:  Dr. Posey Rea   History of Present Illness: Douglas [name] is a 75 yo male with a h/o a hypercoagulable state with a positive lupus anticoagulant who is on chronic Coumadin therapy.  He was initially diagnosed in 2004 after suffering a stroke.  He has hypertension and hyperlipidemia.  Carotid Dopplers in March 2001 demonstrated 0-39% bilateral ICA stenosis.  He  was seen  in March 2011 with complaints of chest pain.  He was enrolled in the Promise trial and randomized to Myoview testing.  The nuclear study demonstrated an ejection fraction of 59%, inferior and apical thinning but no ischemia.  His last echocardiogram was in February 2009 and demonstrated normal LV function with an ejection fraction of 55-60%.   Current Problems (verified): 1)  Pre-operative Cardiovascular Examination  (ICD-V72.81) 2)  Leg Pain  (ICD-729.5) 3)  Health Maintenance Exam  (ICD-V70.0) 4)  Tongue Disorder  (ICD-529.9) 5)  Erectile Dysfunction, Organic  (ICD-607.84) 6)  Tobacco Use, Quit  (ICD-V15.82) 7)  Constipation  (ICD-564.00) 8)  Flushing  (ICD-782.62) 9)  Chest Pain Unspecified  (ICD-786.50) 10)  Myalgia  (ICD-729.1) 11)  Transient Ischemic Attack  (ICD-435.9) 12)  Reactive Airway Disease  (ICD-493.90) 13)  Cough  (ICD-786.2) 14)  Tremor  (ICD-781.0) 15)  Fever Unspecified  (ICD-780.60) 16)  Headache  (ICD-784.0) 17)  Plantar Fasciitis, Bilateral  (ICD-728.71) 18)  Hematochezia  (ICD-578.1) 19)  Hyperlipidemia  (ICD-272.4) 20)  Allergic Rhinitis  (ICD-477.9) 21)  Colonic Polyps, Hx of  (ICD-V12.72) 22)  Sinusitis, Acute  (ICD-461.9) 23)  Muscle Pain  (ICD-729.1) 24)  Paresthesia  (ICD-782.0) 25)  Peripheral Vascular Disease  (ICD-443.9) 26)  Benign Prostatic Hypertrophy  (ICD-600.00) 27)   Actinic Keratosis  (ICD-702.0) 28)  Leg Pain  (ICD-729.5) 29)  Low Back Pain  (ICD-724.2) 30)  Hypercoagulable State, Primary  (ICD-289.81) 31)  Cerebrovascular Accident, Hx of  (ICD-V12.50) 32)  Dyspnea  (ICD-786.05) 33)  Kidney Stone  () 34)  Osteoarthritis  (ICD-715.90) 35)  Hypertension  (ICD-401.9) 36)  Gerd  (ICD-530.81) 37)  Anticoagulation Therapy  (ICD-V58.61)  Current Medications (verified): 1)  Verapamil Hcl Cr 180 Mg  Cp24 (Verapamil Hcl) .Marland Kitchen.. 1 By Mouth Once Daily 2)  Omeprazole 20 Mg  Cpdr (Omeprazole) .Marland Kitchen.. 1 By Mouth Once Daily As Needed 3)  Vicodin 5-500 Mg  Tabs (Hydrocodone-Acetaminophen) .Marland Kitchen.. 1 Qid As Needed 4)  Levitra 20 Mg  Tabs (Vardenafil Hcl) .... 1/2-1 Once Daily As Needed 5)  Aspirin 81 Mg  Tbec (Aspirin) .... One By Mouth Every Day 6)  Vitamin D3 1000 Unit  Tabs (Cholecalciferol) .Marland Kitchen.. 1 Qd 7)  Triamcinolone Acetonide 0.5 % Crea (Triamcinolone Acetonide) .... Apply Bid To Affected Area 8)  Fluticasone Propionate 50 Mcg/act  Susp (Fluticasone Propionate) .... 2 Sprays Each Nostril Once Daily 9)  Loratadine 10 Mg  Tabs (Loratadine) .... Once Daily As Needed Allergies 10)  Alprazolam 0.5 Mg  Tabs (Alprazolam) .Marland Kitchen.. 1 By Mouth Two Times A Day As Needed Anxiety 11)  Coumadin 5 Mg Tabs (Warfarin Sodium) .... Take As Directed By Coumadin Clinic. 12)  Clonidine Hcl 0.1 Mg Tabs (Clonidine Hcl) .Marland Kitchen.. 1 By Mouth Tid 13)  Proair Hfa 108 (90 Base) Mcg/act Aers (Albuterol Sulfate) .... 2 Inh Qid As Needed 14)  Flomax 0.4 Mg Caps (Tamsulosin  Hcl) .... 1 By Mouth Once Daily 15)  Micardis 80 Mg Tabs (Telmisartan) .... One Tablet By Mouth Once Daily  Allergies (verified): 1)  ! Niacin (Niacin) 2)  ! * Ivp Dye 3)  ! Cozaar 4)  ! * Ramipril 5)  ! * Lidoderm Patch 6)  ! Hydrochlorothiazide 7)  Ultram (Tramadol Hcl) 8)  Nyquil (Pseudoeph-Doxylamine-Dm-Apap) 9)  Ceftin 10)  Lovastatin (Lovastatin)  Past History:  Past Medical History: Last updated:  01/08/2009 Anticoagulation therapy: for previous sensory TIA and lupus anticoagulant GERD/erosive esophagitis Hypertension Hyperlipidemia Osteoarthritis -    right knee Cerebrovascular accident, hx of Low back pain Vit D def Nephrolithiasis Benign prostatic hypertrophy Peripheral vascular disease - carotids < 70% Colonic polyps, hx of Allergic rhinitis Hyperlipidemia E.D. TIA 2009  Past Surgical History: Last updated: 01/08/2009 s/p lithotripsy s/p sinus surgury Rotator cuff repair - x 2 s/p lung biopsy 1980  Family History: Last updated: 10/25/2007 Family History Hypertension grandparents with heart disease  Social History: Last updated: 10/25/2007 Retired Married Former Smoker Alcohol use-no  Review of Systems       Denies fever, malais, weight loss, blurry vision, decreased visual acuity, cough, sputum, SOB, hemoptysis, pleuritic pain, palpitaitons, heartburn, abdominal pain, melena, lower extremity edema, claudication, or rash.   Vital Signs:  Patient profile:   75 year old male Weight:      176 pounds Pulse rate:   60 / minute Pulse rhythm:   regular BP sitting:   100 / 50  (left arm) Cuff size:   large  Vitals Entered By: Kem Parkinson (October 04, 2010 9:56 AM)  Physical Exam  General:  Affect appropriate Healthy:  appears stated age HEENT: normal Neck supple with no adenopathy JVP normal no bruits no thyromegaly Lungs clear with no wheezing and good diaphragmatic motion Heart:  S1/S2 no murmur,rub, gallop or click PMI normal Abdomen: benighn, BS positve, no tenderness, no AAA no bruit.  No HSM or HJR Distal pulses intact with no bruits No edema Neuro non-focal Skin warm and dry    Impression & Recommendations:  Problem # 1:  TRANSIENT ISCHEMIC ATTACK (ICD-435.9) Hypercoagulable state.  Continue coumadin.  No carotid disease by duplex  Problem # 2:  HYPERLIPIDEMIA (ICD-272.4) At goal with no vascular disease continue diet  Rx CHOL: 206 (05/11/2010)   LDL: 109 (10/25/2009)   HDL: 34.00 (05/11/2010)   TG: 136.0 (05/11/2010)  Problem # 3:  LOW BACK PAIN (ICD-724.2) Encouraged him  to see Dr Despina Hick or Charlann Boxer for chronic right knee and back pain.  Would need lovenox bridge for any surgery  Patient Instructions: 1)  Your physician wants you to follow-up in: ONE YEAR  You will receive a reminder letter in the mail two months in advance. If you don't receive a letter, please call our office to schedule the follow-up appointment.   EKG Report  Procedure date:  10/04/2010  Findings:      NSR 60 Normal ECG

## 2010-10-20 ENCOUNTER — Telehealth: Payer: Self-pay | Admitting: Internal Medicine

## 2010-10-25 NOTE — Progress Notes (Signed)
Summary: REQ A CALL FROM MD   Phone Note Call from Patient Call back at Home Phone [phone] Call back at [phone]   Summary of Call: Patient is requesting a call from Dr Posey Rea.  Initial call taken by: Lamar Sprinkles, CMA,  October 20, 2010 2:47 PM  Follow-up for Phone Call        called and left a vm Follow-up by: Tresa Garter MD,  October 20, 2010 3:49 PM  Additional Follow-up for Phone Call Additional follow up Details #1::        Pt left vm req MD call him again...............Marland KitchenLamar Sprinkles, CMA  October 20, 2010 6:07 PM     Additional Follow-up for Phone Call Additional follow up Details #2::    Wants Losartan, Micardis is $$$$. OK to switch. Follow-up by: Tresa Garter MD,  October 21, 2010 6:11 PM  New/Updated Medications: LOSARTAN POTASSIUM 100 MG TABS (LOSARTAN POTASSIUM) 1 by mouth once daily for blood pressure Prescriptions: LOSARTAN POTASSIUM 100 MG TABS (LOSARTAN POTASSIUM) 1 by mouth once daily for blood pressure  #90 x 3   Entered and Authorized by:   Tresa Garter MD   Signed by:   Tresa Garter MD on 10/21/2010   Method used:   Electronically to        [facility] [phone]* (retail)       [address]       [place], [place]  [address]       Ph: [phone]       Fax: [phone]   RxID:   [id number]

## 2010-10-27 ENCOUNTER — Encounter: Payer: Self-pay | Admitting: Cardiovascular Disease

## 2010-10-27 DIAGNOSIS — I639 Cerebral infarction, unspecified: Secondary | ICD-10-CM

## 2010-10-27 DIAGNOSIS — Z8679 Personal history of other diseases of the circulatory system: Secondary | ICD-10-CM

## 2010-10-27 DIAGNOSIS — G459 Transient cerebral ischemic attack, unspecified: Secondary | ICD-10-CM

## 2010-10-27 DIAGNOSIS — Z8673 Personal history of transient ischemic attack (TIA), and cerebral infarction without residual deficits: Secondary | ICD-10-CM | POA: Insufficient documentation

## 2010-10-27 HISTORY — DX: Cerebral infarction, unspecified: I63.9

## 2010-10-28 ENCOUNTER — Encounter (INDEPENDENT_AMBULATORY_CARE_PROVIDER_SITE_OTHER): Payer: Medicare Other

## 2010-10-28 ENCOUNTER — Encounter: Payer: Self-pay | Admitting: Internal Medicine

## 2010-10-28 DIAGNOSIS — Z7901 Long term (current) use of anticoagulants: Secondary | ICD-10-CM

## 2010-10-28 DIAGNOSIS — I80299 Phlebitis and thrombophlebitis of other deep vessels of unspecified lower extremity: Secondary | ICD-10-CM

## 2010-10-28 LAB — CONVERTED CEMR LAB: POC INR: 2.3

## 2010-11-03 NOTE — Medication Information (Signed)
Summary: rov/sp  Anticoagulant Therapy  Managed by: Cloyde Reams, RN, BSN Referring MD: Sonda Primes PCP: Dr. Posey Rea Supervising MD: Gala Romney MD, Reuel Boom Indication 1: Deep Venous Thrombosis (ICD-451.19) Indication 2: CVA-stroke (ICD-436) Lab Used: [facility] Site: [facility] INR POC 2.3 INR RANGE 2 - 3  Dietary changes: no    Health status changes: no    Bleeding/hemorrhagic complications: no    Recent/future hospitalizations: no    Any changes in medication regimen? no    Recent/future dental: no  Any missed doses?: no       Is patient compliant with meds? yes       Allergies: 1)  ! Niacin (Niacin) 2)  ! * Ivp Dye 3)  ! Cozaar 4)  ! * Ramipril 5)  ! * Lidoderm Patch 6)  ! Hydrochlorothiazide 7)  Ultram (Tramadol Hcl) 8)  Nyquil (Pseudoeph-Doxylamine-Dm-Apap) 9)  Ceftin 10)  Lovastatin (Lovastatin)  Anticoagulation Management History:      The patient is taking warfarin and comes in today for a routine follow up visit.  Positive risk factors for bleeding include an age of 65 years or older, history of CVA/TIA, and history of GI bleeding.  The bleeding index is 'high risk'.  Positive CHADS2 values include History of HTN and Prior Stroke/CVA/TIA.  Negative CHADS2 values include Age > 19 years old and History of Diabetes.  The start date was 06/22/2003.  Anticoagulation responsible provider: Bensimhon MD, Reuel Boom.  INR POC: 2.3.  Cuvette Lot#: [id number].  Exp: 08/2011.    Anticoagulation Management Assessment/Plan:      The patient's current anticoagulation dose is Coumadin 5 mg tabs: Take as directed by coumadin clinic..  The target INR is 2 - 3.  The next INR is due 11/25/2010.  Anticoagulation instructions were given to patient.  Results were reviewed/authorized by Cloyde Reams, RN, BSN.  He was notified by Cloyde Reams RN.         Prior Anticoagulation Instructions: INR 2.6  Continue your current dose of 1 tablet everyday except 1/2 tablet on Mondays,  Wednesdays, and Fridays.    Current Anticoagulation Instructions: INR 2.3  Continue on same dosage 1 tablet daily except 1/2 tablet on Mondays, Wednesdays, and Fridays.  Recheck in 4 weeks.

## 2010-11-08 ENCOUNTER — Encounter: Payer: Self-pay | Admitting: Internal Medicine

## 2010-11-09 ENCOUNTER — Encounter (INDEPENDENT_AMBULATORY_CARE_PROVIDER_SITE_OTHER): Payer: Medicare Other | Admitting: Internal Medicine

## 2010-11-09 ENCOUNTER — Other Ambulatory Visit: Payer: Medicare Other

## 2010-11-09 ENCOUNTER — Encounter: Payer: Self-pay | Admitting: Internal Medicine

## 2010-11-09 ENCOUNTER — Other Ambulatory Visit: Payer: Self-pay | Admitting: Internal Medicine

## 2010-11-09 DIAGNOSIS — R209 Unspecified disturbances of skin sensation: Secondary | ICD-10-CM

## 2010-11-09 DIAGNOSIS — R413 Other amnesia: Secondary | ICD-10-CM

## 2010-11-09 DIAGNOSIS — Z0000 Encounter for general adult medical examination without abnormal findings: Secondary | ICD-10-CM

## 2010-11-09 DIAGNOSIS — G459 Transient cerebral ischemic attack, unspecified: Secondary | ICD-10-CM

## 2010-11-09 DIAGNOSIS — I10 Essential (primary) hypertension: Secondary | ICD-10-CM

## 2010-11-09 LAB — URINALYSIS
Bilirubin Urine: NEGATIVE
Ketones, ur: NEGATIVE
Urine Glucose: NEGATIVE
Urobilinogen, UA: 0.2 (ref 0.0–1.0)

## 2010-11-09 LAB — CBC WITH DIFFERENTIAL/PLATELET
Basophils Absolute: 0 10*3/uL (ref 0.0–0.1)
Basophils Relative: 0.5 % (ref 0.0–3.0)
Eosinophils Absolute: 0.1 10*3/uL (ref 0.0–0.7)
Lymphocytes Relative: 23.6 % (ref 12.0–46.0)
MCHC: 34.6 g/dL (ref 30.0–36.0)
Neutrophils Relative %: 67.3 % (ref 43.0–77.0)
RBC: 4.62 Mil/uL (ref 4.22–5.81)
RDW: 15.1 % — ABNORMAL HIGH (ref 11.5–14.6)

## 2010-11-09 LAB — PSA: PSA: 2.07 ng/mL (ref 0.10–4.00)

## 2010-11-09 LAB — BASIC METABOLIC PANEL
CO2: 28 mEq/L (ref 19–32)
Calcium: 9 mg/dL (ref 8.4–10.5)
Chloride: 103 mEq/L (ref 96–112)
Potassium: 4.9 mEq/L (ref 3.5–5.1)
Sodium: 137 mEq/L (ref 135–145)

## 2010-11-09 LAB — HEPATIC FUNCTION PANEL
Alkaline Phosphatase: 62 U/L (ref 39–117)
Bilirubin, Direct: 0.1 mg/dL (ref 0.0–0.3)

## 2010-11-09 LAB — LIPID PANEL
HDL: 33.9 mg/dL — ABNORMAL LOW (ref >39.00)
Total CHOL/HDL Ratio: 6
VLDL: 31.8 mg/dL (ref 0.0–40.0)

## 2010-11-11 ENCOUNTER — Other Ambulatory Visit: Payer: Self-pay | Admitting: Internal Medicine

## 2010-11-11 DIAGNOSIS — R413 Other amnesia: Secondary | ICD-10-CM

## 2010-11-15 NOTE — Assessment & Plan Note (Signed)
Summary: WELLNESS/MEDICARE/CD   Vital Signs:  Patient profile:   75 year old male Height:      67 inches Weight:      172 pounds BMI:     27.04 Temp:     97.3 degrees F oral Pulse rate:   76 / minute Pulse rhythm:   regular Resp:     16 per minute BP sitting:   130 / 86  (left arm) Cuff size:   regular  Vitals Entered By: Lanier Prude, Beverly Gust) (November 09, 2010 9:34 AM) CC: [facility] Is Patient Diabetic? No   Primary Care Provider:  Dr. Posey Rea  CC:  [facility].  History of Present Illness: The patient presents for a follow up of hypertension, OA, hyperlipidemia. There is some numbness in hands. The patient presents for a preventive health examination  Patient past medical history, social history, and family history reviewed in detail no significant changes.  Patient is physically active. Depression is negative and mood is good. Hearing is normal, and able to perform activities of daily living. Risk of falling is negligible and home safety has been reviewed and is appropriate. Patient has normal height, he is not overweight, and nl visual acuity is good with glasses. Patient has been counseled on age-appropriate routine health concerns for screening and prevention. Education, counseling done. Cognition is down  according to wife.   Preventive Screening-Counseling & Management  Alcohol-Tobacco     Alcohol drinks/day: rarely     Alcohol type: wine     Smoking Status: quit  Caffeine-Diet-Exercise     Caffeine use/day: 1     Does Patient Exercise: yes     Type of exercise: walking/stretching     Times/week: 7     Depression Counseling: further diagnostic testing and/or other treatment is indicated  Hep-HIV-STD-Contraception     Dental Visit-last 6 months yes     TSE monthly: no     Sun Exposure-Excessive: no  Safety-Violence-Falls     Seat Belt Use: yes     Helmet Use: n/a     Firearms in the Home: firearms in the home     Smoke Detectors: yes     Violence in the Home: no  risk noted     Sexual Abuse: no     Fall Risk: none  Current Medications (verified): 1)  Verapamil Hcl Cr 180 Mg  Cp24 (Verapamil Hcl) .Marland Kitchen.. 1 By Mouth Once Daily 2)  Omeprazole 20 Mg  Cpdr (Omeprazole) .Marland Kitchen.. 1 By Mouth Once Daily As Needed 3)  Vicodin 5-500 Mg  Tabs (Hydrocodone-Acetaminophen) .Marland Kitchen.. 1 Qid As Needed 4)  Levitra 20 Mg  Tabs (Vardenafil Hcl) .... 1/2-1 Once Daily As Needed 5)  Aspirin 81 Mg  Tbec (Aspirin) .... One By Mouth Every Day 6)  Vitamin D3 1000 Unit  Tabs (Cholecalciferol) .Marland Kitchen.. 1 Qd 7)  Triamcinolone Acetonide 0.5 % Crea (Triamcinolone Acetonide) .... Apply Bid To Affected Area 8)  Fluticasone Propionate 50 Mcg/act  Susp (Fluticasone Propionate) .... 2 Sprays Each Nostril Once Daily 9)  Loratadine 10 Mg  Tabs (Loratadine) .... Once Daily As Needed Allergies 10)  Alprazolam 0.5 Mg  Tabs (Alprazolam) .Marland Kitchen.. 1 By Mouth Two Times A Day As Needed Anxiety 11)  Coumadin 5 Mg Tabs (Warfarin Sodium) .... Take As Directed By Coumadin Clinic. 12)  Clonidine Hcl 0.1 Mg Tabs (Clonidine Hcl) .Marland Kitchen.. 1 By Mouth Tid 13)  Proair Hfa 108 (90 Base) Mcg/act Aers (Albuterol Sulfate) .... 2 Inh Qid As Needed 14)  Flomax 0.4  Mg Caps (Tamsulosin Hcl) .Marland Kitchen.. 1 By Mouth Once Daily 15)  Losartan Potassium 100 Mg Tabs (Losartan Potassium) .Marland Kitchen.. 1 By Mouth Once Daily For Blood Pressure  Allergies (verified): 1)  ! Niacin (Niacin) 2)  ! * Ivp Dye 3)  ! Cozaar 4)  ! * Ramipril 5)  ! * Lidoderm Patch 6)  ! Hydrochlorothiazide 7)  Ultram (Tramadol Hcl) 8)  Nyquil (Pseudoeph-Doxylamine-Dm-Apap) 9)  Ceftin 10)  Lovastatin (Lovastatin)  Past History:  Past Medical History: Last updated: 01/08/2009 Anticoagulation therapy: for previous sensory TIA and lupus anticoagulant GERD/erosive esophagitis Hypertension Hyperlipidemia Osteoarthritis -    right knee Cerebrovascular accident, hx of Low back pain Vit D def Nephrolithiasis Benign prostatic hypertrophy Peripheral vascular disease - carotids <  70% Colonic polyps, hx of Allergic rhinitis Hyperlipidemia E.D. TIA 2009  Past Surgical History: Last updated: 01/08/2009 s/p lithotripsy s/p sinus surgury Rotator cuff repair - x 2 s/p lung biopsy 1980  Family History: Last updated: 11/09/2010 Family History Hypertension grandparents with heart disease F Alzheimers  Social History: Last updated: 10/25/2007 Retired Married Former Smoker Alcohol use-no  Family History: Family History Hypertension grandparents with heart disease F Alzheimers  Social History: Smoking Status:  quit Caffeine use/day:  1 Does Patient Exercise:  yes Dental Care w/in 6 mos.:  yes Sun Exposure-Excessive:  no Fall Risk:  none  Review of Systems  The patient denies anorexia, fever, weight loss, weight gain, vision loss, decreased hearing, hoarseness, chest pain, syncope, dyspnea on exertion, peripheral edema, prolonged cough, headaches, hemoptysis, abdominal pain, melena, hematochezia, severe indigestion/heartburn, hematuria, incontinence, genital sores, muscle weakness, suspicious skin lesions, transient blindness, difficulty walking, depression, unusual weight change, abnormal bleeding, enlarged lymph nodes, angioedema, and testicular masses.         memory issues x 12 months or so  Physical Exam  General:  pleasant and cooperative, in no acute distress Head:  Normocephalic and atraumatic without obvious abnormalities. No apparent alopecia or balding. Eyes:  No corneal or conjunctival inflammation noted. EOMI. Perrla. Ears:  External ear exam shows no significant lesions or deformities.  Otoscopic examination reveals clear canals, tympanic membranes are intact bilaterally without bulging, retraction, inflammation or discharge. Hearing is grossly normal bilaterally. Nose:  External nasal examination shows no deformity or inflammation. Nasal mucosa are pink and moist without lesions or exudates. Mouth:  Oral mucosa and oropharynx without  lesions or exudates.  Teeth in good repair. Neck:  supple, no thyromegaly, lymphodenopathy or masses Lungs:  Normal respiratory effort, chest expands symmetrically. Lungs are clear to auscultation, no crackles or wheezes. Heart:  Normal rate and regular rhythm. S1 and S2 normal without gallop, murmur, click, rub or other extra sounds. Abdomen:  Bowel sounds positive,abdomen soft and non-tender without masses, organomegaly or hernias noted. Prostate:  no gland enlargement.   Msk:  No deformity or scoliosis noted of thoracic or lumbar spine.  Stiff LS. R knee w/medial tenderness. R IT band is tender Extremities:  No clubbing, cyanosis, edema, or deformity noted with normal full range of motion of all joints.   Neurologic:  alert & oriented X3.   Recalls 0/3 in 10 sec Able to do serial 7s x 1 only Skin:  Intact without suspicious lesions or rashes Cervical Nodes:  No lymphadenopathy noted Psych:  Cognition and judgment appear intact. Alert and cooperative with normal attention span and concentration. No apparent delusions, illusions, hallucinations   Impression & Recommendations:  Problem # 1:  HEALTH MAINTENANCE EXAM (ICD-V70.0) Assessment New  Overall doing well,  age appropriate education and counseling updated and referral for appropriate preventive services done unless declined, immunizations up to date or declined, diet counseling done if overweight, urged to quit smoking if smokes, most recent labs reviewed and current ordered if appropriate, ecg reviewed or declined (interpretation per ECG scanned in the EMR if done); information regarding Medicare Preventation requirements given if appropriate.  I have personally reviewed the Medicare Annual Wellness questionnaire and have noted 1.   The patient's medical and social history 2.   Their use of alcohol, tobacco or illicit drugs 3.   Their current medications and supplements 4.   The patient's functional ability including ADL's, fall  risks, home safety risks and hearing or visual             impairment. 5.   Diet and physical activities 6.   Evidence for depression or mood disorders The patients weight, height, BMI and visual acuity have been recorded in the chart I have made referrals, counseling and provided education to the patient based review of the above and I have provided the pt with a written personalized care plan for preventive services.  Orders: TLB-BMP (Basic Metabolic Panel-BMET) (80048-METABOL) TLB-CBC Platelet - w/Differential (85025-CBCD) TLB-Hepatic/Liver Function Pnl (80076-HEPATIC) TLB-Lipid Panel (80061-LIPID) TLB-PSA (Prostate Specific Antigen) (84153-PSA) TLB-TSH (Thyroid Stimulating Hormone) (84443-TSH) TLB-Udip ONLY (81003-UDIP) Medicare -1st Annual Wellness Visit [phone])  Problem # 2:  MEMORY LOSS (ICD-780.93) Assessment: New We may need to d/c Clonidine Start Aricept He is not taking Alprazolam and is taking very little Vicodin Orders: TLB-B12, Serum-Total ONLY (78295-A21) TLB-Sedimentation Rate (ESR) (85652-ESR) TLB-Uric Acid, Blood (84550-URIC) Radiology Referral (Radiology) Neurology Referral (Neuro)  Problem # 3:  HYPERTENSION (ICD-401.9) Assessment: Unchanged  His updated medication list for this problem includes:    Verapamil Hcl Cr 180 Mg Cp24 (Verapamil hcl) .Marland Kitchen... 1 by mouth once daily    Clonidine Hcl 0.1 Mg Tabs (Clonidine hcl) .Marland Kitchen... 1/2 tab  by mouth two times a day    Losartan Potassium 100 Mg Tabs (Losartan potassium) .Marland Kitchen... 1 by mouth once daily for blood pressure  BP today: 130/86 Prior BP: 100/50 (10/04/2010)  Prior 10 Yr Risk Heart Disease: 14 % (05/08/2008)  Labs Reviewed: K+: 4.6 (05/11/2010) Creat: : 1.1 (05/11/2010)   Chol: 206 (05/11/2010)   HDL: 34.00 (05/11/2010)   LDL: 109 (10/25/2009)   TG: 136.0 (05/11/2010)  Problem # 4:  TRANSIENT ISCHEMIC ATTACK (ICD-435.9) Assessment: Unchanged  His updated medication list for this problem includes:     Aspirin 81 Mg Tbec (Aspirin) ..... One by mouth every day    Coumadin 5 Mg Tabs (Warfarin sodium) .Marland Kitchen... Take as directed by coumadin clinic.  Complete Medication List: 1)  Verapamil Hcl Cr 180 Mg Cp24 (Verapamil hcl) .Marland Kitchen.. 1 by mouth once daily 2)  Omeprazole 20 Mg Cpdr (Omeprazole) .Marland Kitchen.. 1 by mouth once daily as needed 3)  Vicodin 5-500 Mg Tabs (Hydrocodone-acetaminophen) .Marland Kitchen.. 1 qid as needed 4)  Levitra 20 Mg Tabs (Vardenafil hcl) .... 1/2-1 once daily as needed 5)  Aspirin 81 Mg Tbec (Aspirin) .... One by mouth every day 6)  Vitamin D3 1000 Unit Tabs (Cholecalciferol) .Marland Kitchen.. 1 qd 7)  Triamcinolone Acetonide 0.5 % Crea (Triamcinolone acetonide) .... Apply bid to affected area 8)  Fluticasone Propionate 50 Mcg/act Susp (Fluticasone propionate) .... 2 sprays each nostril once daily 9)  Loratadine 10 Mg Tabs (Loratadine) .... Once daily as needed allergies 10)  Alprazolam 0.5 Mg Tabs (Alprazolam) .Marland Kitchen.. 1 by mouth two times a day as  needed anxiety 11)  Coumadin 5 Mg Tabs (Warfarin sodium) .... Take as directed by coumadin clinic. 12)  Clonidine Hcl 0.1 Mg Tabs (Clonidine hcl) .... 1/2 tab  by mouth two times a day 13)  Proair Hfa 108 (90 Base) Mcg/act Aers (Albuterol sulfate) .... 2 inh qid as needed 14)  Flomax 0.4 Mg Caps (Tamsulosin hcl) .Marland Kitchen.. 1 by mouth once daily 15)  Losartan Potassium 100 Mg Tabs (Losartan potassium) .Marland Kitchen.. 1 by mouth once daily for blood pressure 16)  Aricept 10 Mg Tabs (Donepezil hcl) .... 1/2 tab by mouth once daily x 1 month, then 1 po qhs for memory  Other Orders: T-Vitamin D (25-Hydroxy) ([id number])  Patient Instructions: 1)  We will try to wean you off Clonidine 2)  Please schedule a follow-up appointment in 1 month. Prescriptions: ARICEPT 10 MG TABS (DONEPEZIL HCL) 1/2 tab by mouth once daily x 1 month, then 1 po qhs for memory  #90 x 3   Entered and Authorized by:   Tresa Garter MD   Signed by:   Tresa Garter MD on 11/09/2010   Method used:    Print then Give to Patient   RxID:   [id number]    Orders Added: 1)  T-Vitamin D (25-Hydroxy) [[id number]] 2)  TLB-BMP (Basic Metabolic Panel-BMET) [80048-METABOL] 3)  TLB-CBC Platelet - w/Differential [85025-CBCD] 4)  TLB-Hepatic/Liver Function Pnl [80076-HEPATIC] 5)  TLB-Lipid Panel [80061-LIPID] 6)  TLB-PSA (Prostate Specific Antigen) [84153-PSA] 7)  TLB-TSH (Thyroid Stimulating Hormone) [84443-TSH] 8)  TLB-Udip ONLY [81003-UDIP] 9)  TLB-B12, Serum-Total ONLY [82607-B12] 10)  TLB-Sedimentation Rate (ESR) [85652-ESR] 11)  TLB-Uric Acid, Blood [84550-URIC] 12)  Radiology Referral [Radiology] 91)  Neurology Referral [Neuro] 14)  Medicare -1st Annual Wellness Visit [G0438] 15)  Est. Patient Level IV [62952]

## 2010-11-15 NOTE — Letter (Signed)
Summary: Referral request/Spouse  Referral request/Spouse   Imported By: Sherian Rein 11/10/2010 12:27:32  _____________________________________________________________________  External Attachment:    Type:   Image     Comment:   External Document

## 2010-11-16 ENCOUNTER — Ambulatory Visit (INDEPENDENT_AMBULATORY_CARE_PROVIDER_SITE_OTHER)
Admission: RE | Admit: 2010-11-16 | Discharge: 2010-11-16 | Disposition: A | Discharge status: Home / Self Care | Payer: Medicare Other | Source: Ambulatory Visit | Attending: Internal Medicine | Admitting: Internal Medicine

## 2010-11-16 DIAGNOSIS — R413 Other amnesia: Secondary | ICD-10-CM

## 2010-11-18 ENCOUNTER — Telehealth: Payer: Self-pay | Admitting: *Deleted

## 2010-11-18 NOTE — Telephone Encounter (Signed)
Message copied by Lanier Prude on Fri Nov 18, 2010 10:38 AM ------      Message from: Sonda Primes      Created: Thu Nov 17, 2010  9:56 PM       No new changes on CT - pls inform

## 2010-11-18 NOTE — Telephone Encounter (Signed)
Pt informed

## 2010-11-22 ENCOUNTER — Telehealth: Payer: Self-pay | Admitting: *Deleted

## 2010-11-22 NOTE — Telephone Encounter (Signed)
Agree  OK with me

## 2010-11-22 NOTE — Telephone Encounter (Signed)
Pt recently decreased clonidine 0.1 to 1/2 bid. He says first week bp was "good". Last few days bp has been elevated, last night - 205/89. He resumed taking one whole pill bid. Ok with you? He will continue to monitor his BP on daily basis.

## 2010-11-23 NOTE — Telephone Encounter (Signed)
Patient notified per MD.

## 2010-11-25 ENCOUNTER — Ambulatory Visit (INDEPENDENT_AMBULATORY_CARE_PROVIDER_SITE_OTHER): Payer: Medicare Other | Admitting: *Deleted

## 2010-11-25 DIAGNOSIS — G459 Transient cerebral ischemic attack, unspecified: Secondary | ICD-10-CM

## 2010-11-25 DIAGNOSIS — Z8679 Personal history of other diseases of the circulatory system: Secondary | ICD-10-CM

## 2010-11-25 DIAGNOSIS — I639 Cerebral infarction, unspecified: Secondary | ICD-10-CM

## 2010-11-25 DIAGNOSIS — I6350 Cerebral infarction due to unspecified occlusion or stenosis of unspecified cerebral artery: Secondary | ICD-10-CM

## 2010-11-25 LAB — POCT INR: INR: 3

## 2010-11-25 NOTE — Patient Instructions (Signed)
INR 3.0 Continue taking 1 tablet (5mg ) daily, except take 1/2 tablet (2.5mg ) on Mondays, Wednesdays, and Fridays. Recheck in 4 weeks.

## 2010-12-15 ENCOUNTER — Ambulatory Visit: Payer: Medicare Other | Admitting: Internal Medicine

## 2010-12-16 ENCOUNTER — Other Ambulatory Visit: Payer: Self-pay | Admitting: Cardiology

## 2010-12-16 ENCOUNTER — Telehealth: Payer: Self-pay | Admitting: Cardiovascular Disease

## 2010-12-16 DIAGNOSIS — I6529 Occlusion and stenosis of unspecified carotid artery: Secondary | ICD-10-CM

## 2010-12-16 NOTE — Telephone Encounter (Signed)
In review of pt's chart and looking at orders that have been placed - pt is to have a carotid doppler - not a 2 D Echo.  Pt did receive a letter to call to be scheduled.  Call transferred to schedules for appt to be made.

## 2010-12-16 NOTE — Telephone Encounter (Signed)
Pt wants to confirm that dr Eden Emms wants him to have ultrasound of his heart. Pt would like to talk to a nurse.

## 2010-12-19 ENCOUNTER — Encounter (INDEPENDENT_AMBULATORY_CARE_PROVIDER_SITE_OTHER): Payer: Medicare Other | Admitting: Cardiology

## 2010-12-19 DIAGNOSIS — I6529 Occlusion and stenosis of unspecified carotid artery: Secondary | ICD-10-CM

## 2010-12-23 ENCOUNTER — Ambulatory Visit (INDEPENDENT_AMBULATORY_CARE_PROVIDER_SITE_OTHER): Payer: Medicare Other | Admitting: *Deleted

## 2010-12-23 DIAGNOSIS — I6350 Cerebral infarction due to unspecified occlusion or stenosis of unspecified cerebral artery: Secondary | ICD-10-CM

## 2010-12-23 DIAGNOSIS — G459 Transient cerebral ischemic attack, unspecified: Secondary | ICD-10-CM

## 2010-12-23 DIAGNOSIS — I639 Cerebral infarction, unspecified: Secondary | ICD-10-CM

## 2010-12-23 DIAGNOSIS — Z8679 Personal history of other diseases of the circulatory system: Secondary | ICD-10-CM

## 2011-01-08 ENCOUNTER — Other Ambulatory Visit: Payer: Self-pay | Admitting: Internal Medicine

## 2011-01-10 NOTE — Assessment & Plan Note (Signed)
[facility]                            CARDIOLOGY OFFICE NOTE   NAME:Douglas [name], Douglas [name]                   MRN:          [id number]  DATE:04/08/2007                            DOB:          1936-05-31    The patient is seen today by me as a new patient.  I have really not  seen him since 2004 when I did a TEE on him.  He has been seen by  multiple other doctors in our group.  He was most recently seen by Tereso Newcomer.  The patient has apparently positive lupus anticoagulant on  chronic Coumadin for hypercoagulable state.  When I initially saw the  patient back in 2004, he had a sensory TIA, and this was thought to be  the result of hypercoagulable state and positive lupus anticoagulant.   The patient has not had any recurrences.   He had two episodes of chest pain, one in January which was  nonexertional, was sharp.  It lasted for about an hour.  It happened at  rest.  There was no radiation, diaphoresis.  He saw Dr. Posey Rea and  had an EKG that was normal.  This was not followed up on.  He had a  recurrent episode in March or April.  Again, this was atypical and had  occurred at rest while he was talking on the phone.   The patient's coronary risk factors include positive family history,  hypercholesterolemia and hypertension.   He has not had a recent stress test.   REVIEW OF SYSTEMS:  The patient has multiple issues.  He has had some  erectile dysfunction.  He has seen Dr. Isabel Caprice in the past and has had  Viagra.  He is wondering if it was safe.  I told him that, given his  recent chest pain, I would prefer to do a stress test, but I suspect it  would be.  I also counseled him not to take it with Flomax which he  takes for prostatism.   The patient has had recent CT scans showing some bilateral renal calculi  and hydronephrosis.  There is a question of an exophytic lesion in the  pole of his kidney.  He is to follow up with Dr. Isabel Caprice for  this.   He also has chronic right knee pain.  He is hesitant to have a knee  replacement. I told him that the surgery is fairly routine at this  point; however, he would be at quite a high risk for postop DVT given  his hypercoagulable state.   His Review of Systems is otherwise negative.   CURRENT MEDICATIONS:  1. Warfarin as directed.  2. Lovastatin 20.  3. Folic acid.  4. Gemfibrozil 600 b.i.d.  5. Verapamil 120 a day.  6. Omeprazole 20 a day.  7. Micardis 80 a day.  8. Flomax, unknown dose.   ALLERGIES:  IVP DYE and ULTRACET.   PHYSICAL EXAMINATION:  GENERAL:  Remarkable for a healthy-appearing,  middle-age white male in no distress.  Affect is appropriate if not  jovial.  VITAL SIGNS:  Weight 168.  Blood pressure 130/70, pulse 70 and regular.  He is afebrile.  Respiratory rate is 14.  HEENT:  Normal.  NECK:  Supple.  There is no thyromegaly, no lymphadenopathy, no JVP  elevation or bruits.  LUNGS: Clear.  Good diaphragmatic motion.  No wheezing.  HEART:  S1, S2 normal heart sounds.  PMI is normal.  ABDOMEN:  Bowel sounds are positive.  There is no AAA, no tenderness, no  hepatosplenomegaly, no hepatojugular reflux.  No bruits.  EXTREMITIES:  Femorals +3 bilaterally without bruit.  PTs are +2, and  distal pulses are intact.  No edema.  NEUROLOGIC:  Nonfocal.  There is no muscular weakness.  SKIN:  Warm and dry.  He does have some crepitus in his right knee from  chronic arthritis.   IMPRESSION:  1. Hypercoagulable state with positive lupus anticoagulant.  Lifelong      anticoagulation.  Continue to follow in the Coumadin clinic.  It      has been up and down.  He was cautioned to avoid leafy green      vegetables in his diet.  He has not had any bleeding diaphysis.  2. Chest pain, atypical but recurrent. Particularly in the setting of      risk factors of family history, hypertension, hyperlipidemia, he      will have an adenosine Myoview study.  We will arrange this  in      September.  Unfortunately, the patient's daughter has cancer, and      he wants to go to [place] to visit her.  I think it can wait until      then.  3. Hypertension, currently well controlled on Micardis and verapamil.      Continue current dosages and low-sodium diet.  4. Hypercholesterolemia.  Continue lovastatin 20 a day.  Follow lipid      and liver profile in 6 months.  5. Prostatism with renal calculi.  The patient will follow up with Dr.      Isabel Caprice.  Again, biggest concern is the fact that he wanted to use      Viagra with Flomax.  I told him to not use both in the same day.      He will follow up with Dr. Isabel Caprice.  It is encouraging that he has      not had any significant hematuria.  6. History of reflux. Continue omeprazole 20 a day.  Avoid spicy foods      and late night eating.   I will see the patient back in a year so long as his stress test is  normal.     Theron Arista C. Eden Emms, MD, [facility]  Electronically Signed    PCN/MedQ  DD: 04/08/2007  DT: 04/09/2007  Job #: [id number]

## 2011-01-10 NOTE — Discharge Summary (Signed)
NAME:  IZEA, LIVOLSI            ACCOUNT NO.:  [id number]   MEDICAL RECORD NO.:  [id number]          PATIENT TYPE:  INP   LOCATION:  3010                         FACILITY:  [facility]   PHYSICIAN:  Valerie A. Felicity Coyer, MDDATE OF BIRTH:  [birth year]   DATE OF ADMISSION:  10/01/2007  DATE OF DISCHARGE:  10/02/2007                               DISCHARGE SUMMARY   DISCHARGE DIAGNOSES:  1. Left arm transient paresthesias.  2. Hypercoagulopathy.   HISTORY OF PRESENT ILLNESS:  Mr. Jowett is a 75 year old white male  with a history of hypercoagulopathy, on chronic Coumadin, who reports  awakening on the day of admission with tingling to the left arm that  persisted for several hours and had resolved on exam in the emergency  room.  Both patient and wife denied any recent gait disturbance, slurred  speech, or focal motor weakness.  Patient did report a 24-hour bug last  week with vomiting and diarrhea.  He denied any recent chest pain,  shortness of breath, abdominal pain, dysuria, lower extremity swelling,  weakness, or fatigue.  Patient is seen at the Coumadin clinic and  managed by Shelby Dubin every two weeks with recent supratherapeutic INR.  He denies any abnormal bleeding.   PAST MEDICAL HISTORY:  1. 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  2. Hyperlipidemia.  3. Hypertension.  4. Hypercoagulable state with positive lupus anticoagulant.  5. Chronic Coumadin secondary to #4.  6. GERD.  7. Chronic knee pain and lower back pain.  8. History of renal calculi.  9. BPH.  10.Vitamin D deficiency.   COURSE OF HOSPITALIZATION:  1. Left arm paresthesias:  CT done in the emergency room on admission      showed progression of right parietal white matter small vessel      disease.  MRI was obtained which revealed no acute infarct.  Did      reveal atrophy and chronic microvascular white matter disease as      well as chronic sinusitis and an old left occipital lobe infarct.  Carotid Dopplers revealed left ICA stenosis of 60-80%.  Patient was      placed on aspirin 81 mg daily.  It is recommended that carotid      Dopplers be repeated in 6-12 months by primary care MD to rule out      any progression of disease.  Chest x-ray revealed no active      disease.  A 2D echo done on February 4th did not show any source of      emboli and revealed a left ventricular ejection fraction of 55-60%.      Cardiac enzymes were negative x2.  Patient asymptomatic on the day      of discharge.  EKG was within normal limits with no arrhythmias      overnight on telemetry.  There is no definitive diagnosis for TIA      versus CVA.  Patient felt medically stable for discharge, as      symptoms have resolved and workup completely negative.   1. Hypercoagulopathy with supratherapeutic INR.  INR on admission 5.2,  down to 4.3 on the day of discharge.  No plans to reverse INR      secondary to positive lupus anticoagulation and high risk for      thrombolytic event.  Findings were discussed with Shelby Dubin at      the Coumadin clinic.  Patient is scheduled for close followup at      discharge.   1. Dyslipidemia:  Fasting lipid profile done during this      hospitalization revealed a total cholesterol of 90, HDL 29, LDL 42,      triglycerides 95.  Note that patient has been intolerant of niacin      in the past with pronounced decrease in HDL.  The patient      instructed to continue the lipid with no need for statin at this      time.   DISCHARGE MEDICATIONS:  1. Folic acid 1 mg p.o. daily.  2. Gemfibrozil 600 mg p.o. b.i.d.  3. Prilosec 20 mg p.o. daily.  4. Flomax 0.4 mg p.o. daily.  5. Micardis 80 mg p.o. daily.  6. Verapamil 180 mg p.o. daily.  7. Coumadin as directed by the Coumadin clinic.  He is instructed to      hold Coumadin on the day of discharge until he follows up with the      Coumadin clinic.  8. Aspirin 81 mg p.o. daily.   PERTINENT LAB WORK AT  DISCHARGE:  White cell count 4.7, hemoglobin 11.8,  hematocrit 34.2, platelet count 115, INR 4.3.  Sodium 141, potassium  3.7, BUN 17, creatinine 0.92.  As mentioned above, total cholesterol of  90, HDL 29, LDL 42, triglycerides 95.  Cardiac enzymes were negative x3.   DISPOSITION:  As mentioned above, the patient is instructed for close  followup with the Coumadin clinic.  He is scheduled to be seen there on  February 5th at 9 a.m.  In addition, he is scheduled for followup with  Dr. Posey Rea on February 18 at 3:15 p.m.  He is instructed to return to  the ER for any recurrent numbness or tingling, any weakness, slurred  speech, or difficulty walking.      Cordelia Pen, NP      Raenette Rover. Felicity Coyer, MD  Electronically Signed    LE/MEDQ  D:  10/30/2007  T:  10/30/2007  Job:  [id number]   cc:   Georgina Quint. Plotnikov, MD

## 2011-01-10 NOTE — Assessment & Plan Note (Signed)
[facility]                           PRIMARY CARE OFFICE NOTE   NAME:Elsayed, ARVIN ABELLO                   MRN:          [id number]  DATE:01/24/2007                            DOB:          02/23/1936    The patient is a 75 year old male who presents for a follow up of his  current medical problems including history of CVA, GERD, hypertension,  anticoagulation, history of kidney stones, high blood pressure.   PAST MEDICAL HISTORY/FAMILY HISTORY/SOCIAL HISTORY:  As per December 04, 2005 notes.   CURRENT MEDICATIONS:  Reviewed.   ALLERGIES:  1. IVP DYE caused rash.  2. Ultracet caused hallucinations.   REVIEW OF SYSTEMS:  No chest pain, no shortness of breath, no syncope,  no new neurologic symptoms.  Blood pressure seems to be controlled at  home.  The rest of the 18-point review of systems is negative.   PHYSICAL EXAMINATION:  VITAL SIGNS:  Blood pressure 116/65, pulse 71,  temperature 97.2, weight 166 pounds.  VITAL SIGNS:  He is in no acute distress.  Looks well.  HEENT:  Moist mucosa.  NECK:  Supple.  No bruit.  LUNGS:  Clear.  No wheezes or rales.  HEART:  S1 and S2.  No murmur, no gallop.  ABDOMEN:  Soft, nontender.  No organomegaly or masses felt.  EXTREMITIES:  Lower extremities without edema.  NEUROLOGIC:  He is alert, oriented and cooperative.  Denies being  depressed.  No meningeal signs.  SKIN:  Clear with aging changes.   ASSESSMENT AND PLAN:  1. Hypertension, controlled well.  Obtain EKG.  Obtain chest x-ray.      Repeat visit in 6 months.  2. Anticoagulation.  Continue with Coumadin.  INR monthly.  3. History of cerebrovascular accident; no recurrence.  Treatment as      per above plan.  4. Gastroesophageal reflux disease, on therapy.  5. General health maintenance.  Vaccinations are up to date.  Zostavax      given last year.  Will      check vitamin D level.  Continue with healthy lifestyle.  6. History of positive lupus  anticoagulation.  He is on Coumadin.  7. Dyslipidemia, on therapy.     Georgina Quint. Plotnikov, MD  Electronically Signed    AVP/MedQ  DD: 01/24/2007  DT: 01/24/2007  Job #: [id number]

## 2011-01-13 NOTE — Cardiovascular Report (Signed)
NAME:  Douglas [name], Douglas [name]                      ACCOUNT NO.:  [id number]   MEDICAL RECORD NO.:  [id number]                   PATIENT TYPE:  AMB   LOCATION:  ENDO                                 FACILITY:  [facility]   PHYSICIAN:  Charlton Haws, M.D.                  DATE OF BIRTH:  [birth year]   DATE OF PROCEDURE:  07/28/2003  DATE OF DISCHARGE:                              CARDIAC CATHETERIZATION   INDICATIONS:  Mr. Stafford is a 75 year old patient of Aleksei V. Plotnikov,  M.D. [facility] and Duke Salvia, M.D.  He has also seen Olga Millers, M.D.  and Pricilla Riffle, M.D. in the past.   He had a recent _________ CVA.  He has a positive lupus anticoagulant.  He  has been maintained on Coumadin.  Study was done to rule out source of  embolus.   PROCEDURE:  The patient was sedated with 7 mg of Versed.  Using digital  technique, an OmniPlane probe was advanced in the distal esophagus without  incident.  Transgastric imaging revealed normal LV function with an EF of  60%.  There is no mural thrombus.  Mitral valve was mildly thickened with  mild prolapse of the posterior leaflet and mild MR.  Aortic valve was  trileaflet and sclerotic.  There was no evidence of vegetations or mass.  Ascending aorta was normal with no dissection.  Right-sided cardiac chambers  were normal.  There was no left atrial appendage thrombus.   Bubble study showed a small amount of late right to left shunt consistent  with a small possible pulmonary shunt which should not be of clinical  significance.  There was no PFO and no ASD and no cardiac shunt.   Imaging of the aorta showed no significant debris.   FINAL IMPRESSION:  1. Normal left ventricular function.  Ejection fraction 60%.  2. Mild mitral regurgitation.  3. Aortic valve sclerosis.  4. No left atrial appendage thrombus.  5. No PFO or ASD.  6. Possible small pulmonary shunt which should not be clinically     significant.   Overall, there was no clear  source of embolus.   The patient will follow up with Duke Salvia, M.D.  Most likely, he will  need a referral to rheumatology and hematology in regards to his lupus  anticoagulant.  Given his recent sensory stroke and positive lupus  anticoagulant, I suspect he will need to be on lifelong Coumadin.                                               Charlton Haws, M.D.    PN/MEDQ  D:  07/28/2003  T:  07/28/2003  Job:  [id number]   cc:   Georgina Quint. Plotnikov, M.D. [facility]   Duke Salvia, M.D.

## 2011-01-13 NOTE — Assessment & Plan Note (Signed)
[facility]                              CARDIOLOGY OFFICE NOTE   NAME:Douglas [name], Douglas [name]                   MRN:          [id number]  DATE:04/06/2006                            DOB:          [birth year]    SUBJECTIVE:  Douglas [name] is a very pleasant 75 year old male patient who  was last seen in our office by Dr. Eden Emms in 2004.  The patient has a  history of hypercoagulable state with positive lupus anticoagulant and is  status post left brain stroke in 2004.  He is on chronic Coumadin therapy.  He needs surveillance colonoscopy.  He was added onto my schedule today for  preoperative evaluation.  Dr. Marina Goodell plans to do his procedure on Coumadin.  Should he have any pathology found, then decisions would be made at a later  date as to how to address those.  The patient denies any chest pain or  shortness of breath.  Denies any exertional arm or jaw discomfort, nausea,  diaphoresis, syncope, presyncope, orthopnea or paroxysmal nocturnal dyspnea.   CURRENT MEDICATIONS:  1. Warfarin as directed.  2. Lovastatin 20 mg daily.  3. Folic acid 1 mg daily.  4. Gemfibrozil 600 mg b.i.d.  5. Verapamil SR 120 mg daily.  6. Omeprazole 20 mg daily.  7. Micardis 40 mg daily.  8. Vicodin p.r.n.  9. Darvocet p.r.n.  10.Flomax p.r.n.   ALLERGIES:  1. IVP DYE.  2. ULTRACET.   SOCIAL HISTORY:  Patient denies any tobacco or alcohol abuse.   FAMILY HISTORY:  Significant for coronary disease in two grandparents.  Otherwise, there is no significant premature coronary artery disease.   PHYSICAL EXAM:  He is a well-nourished, well-developed male in no acute  distress.  Blood pressure 116/70, pulse 55, weight 163 pounds.  HEENT:  Unremarkable.  NECK:  Without JVD.  CARDIAC:  Normal S1, S2, regular rate and rhythm without murmurs, clicks,  rubs or gallops.  LUNGS:  Clear to auscultation bilaterally without wheezing, rhonchi or  rales.  ABDOMEN:  Soft, nontender  with normoactive bowel sounds, no organomegaly.  EXTREMITIES:  Without edema.  Calves are soft, nontender bilaterally.  VASCULAR EXAM:  Reveals 2+ carotid pulses bilaterally without bruits.  NEUROLOGIC:  Nonfocal.  SKIN:  Warm and dry.   Electrocardiogram reveals sinus rhythm with a heart rate of 55, normal axis,  no acute changes.   IMPRESSION:  1. Hypercoagulable state with positive lupus anticoagulant status post      cerebrovascular accident in 2004.      a.     Chronic Coumadin therapy.  2. History of colon polyp.      a.     Needs surveillance colonoscopy.  3. Gastroesophageal reflux disease.  4. Hypertension.  5. Hyperlipidemia.   PLAN:  The patient presents today for evaluation prior to his colonoscopy.  He is having no significant symptoms to suggest ischemia.  He requires no  further workup prior to his procedure from a cardiac standpoint.  He should  be at acceptable risk.  I discussed this with Dr. Samule Ohm who agreed.  We can  certainly  be of assistance should he need help with anticoagulation in the  event that he needs to be brought back for polypectomy or some other  invasive procedure.  Our Coumadin Clinic can certainly help out with that.  Will bring the patient back in 12 months' time with Dr. Eden Emms for routine  follow up.                                  Tereso Newcomer, PA-C                              Salvadore Farber, MD   SW/MedQ  DD:  04/06/2006  DT:  04/06/2006  Job #:  [id number]   cc:   Wilhemina Bonito. Eda Keys., MD

## 2011-01-13 NOTE — Op Note (Signed)
[place]. [facility]  Patient:    Douglas [name], Douglas [name] Visit Number: [id number] MRN: [id number]          Service Type: DSU Location: [facility] Attending Physician:  Susy Frizzle Dictated by:   Jeannett Senior Pollyann Kennedy, M.D. Proc. Date: 01/27/02 Admit Date:  01/27/2002 Discharge Date: 01/27/2002   CC:         Sonda Primes, M.D. [facility]   Operative Report  PREOPERATIVE DIAGNOSIS:  Chronic left ethmoid and maxillary sinusitis.  POSTOPERATIVE DIAGNOSIS:  Chronic left ethmoid and maxillary sinusitis.  OPERATION PERFORMED: 1. Left maxillary antrostomy. 2. Endoscopic anterior partial ethmoidectomy.  SURGEON:  Jefry H. Pollyann Kennedy, M.D.  ANESTHESIA:  General endotracheal.  COMPLICATIONS:  None.  OPERATIVE FINDINGS:  Completely obstructed left maxillary sinus filled with purulent secretions.  Polypoid disease within the antrum and inflammatory swelling of the bulla ethmoidalis and infundibular tissue.  COMPLICATIONS:  None.  The patient tolerated the procedure well, was awakened, extubated and transferred to recovery in stable condition.  REFERRING PHYSICIAN:  Sonda Primes, M.D. [name]  INDICATIONS FOR PROCEDURE:  The patient is a 75 year old gentleman with a several month history of chronic left-sided sinusitis that has failed to clear with prolonged oral antibiotic therapy.  The risks, benefits, alternatives and complications of the procedure were explained to the patient, who seemed to understand and agreed to surgery.  DESCRIPTION OF PROCEDURE:  The patient was taken to the operating room and placed on the operating table in the supine position.  Following induction of general endotracheal anesthesia, the patient was draped in the standard fashion.  Oxymetazoline spray was used preoperatively in the nose.  1% Xylocaine with epinephrine was infiltrated into the superior and the posterior attachments of the middle turbinate on the left side.  A total of about 2  cc was injected.  1 - Left endoscopic maxillary antrostomy.  Using a 0 degree nasal endoscope, the middle meatus area was examined.  A sickle knife was used to remove the uncinate process in its entirety from there superior attachment to inferior attachment.  After that had been performed, the natural ostium of the maxillary sinus was identified and a curved suction was passed into the antrum.  A large amount of purulent secretions was obtained and was sent with the Lukens trap for culture and sensitivity testing.  The antrostomy was enlarged anteriorly using a backbiting forceps, posteriorly using a straight through-cutting forceps and inferiorly using flag forceps, a large antrostomy was created.  The sinus was carefully irrigated with saline solution using a curved suction and a 20 cc syringe.  2 - Left endoscopic anterior ethmoidectomy.  After having performed uncinectomy, the bulla ethmoidalis was opened using the microdebrider and was completely taken down to all bony attachments.  The agger nasi cell was identified and was opened and taken down as well.  This provided visualization of the frontal recess area.  The lamina papyracea was kept intact.  The ground lamella was kept intact and the posterior cells were not entered.  There was minimal bleeding.  The nasal and sinus cavities were irrigated with saline and suctioned.  Afrin soaked pledgets were packed in the ethmoid cavity and were removed in the recovery room without difficulty. Dictated by:   Jeannett Senior Pollyann Kennedy, M.D. Attending Physician:  Susy Frizzle DD:  01/27/02 TD:  01/27/02 Job: [id number]

## 2011-01-13 NOTE — Discharge Summary (Signed)
NAME:  Douglas [name], Douglas [name]                      ACCOUNT NO.:  [id number]   MEDICAL RECORD NO.:  [id number]                   PATIENT TYPE:  INP   LOCATION:  3007                                 FACILITY:  [facility]   PHYSICIAN:  Rene Paci, M.D. [facility]          DATE OF BIRTH:  10/14/1935   DATE OF ADMISSION:  06/15/2003  DATE OF DISCHARGE:  06/19/2003                                 DISCHARGE SUMMARY   DISCHARGE DIAGNOSES:  1. Left occipital and parietal infarcts with right sided paresthesias.  2. Hypertriglyceridemia.  3. Hypertension.   BRIEF ADMISSION HISTORY:  [name] is a 75 year old white male who awoke  around 6 a.m. with right sided paresthesias without hemiparesis. No  dysarthria, dysphagia, or diplopia. He did complain of an maxillary headache  on June 14, 2003. After several hours and lower extremity paresthesias  resolved. He did, however, have persistent paresthesias of the right  shoulder radiating to the right lumbar hip area. He has also had some ataxia  of his right hand which is now gone. Right facial paresthesias have also  resolved.   PAST MEDICAL HISTORY:  1. Hypertension.  2. Gastroesophageal reflux disease with history of endoscopy in February     2002.  3. Treadmill stress test three years prior, was normal.  4. Status post bilateral rotator cuff repair secondary to bone spurs.  5. Interstitial fibrosis via lung biopsy in 1980.  6. Osteoarthritis of the right knee and C spine.   HOSPITAL COURSE:  1. Neuro. The patient was admitted to rule out CVA. Head CT showed early     subacute stroke of the left parietal area. This was followed by a MRI of     the brain that showed acute left parietal/occipital embolic infarcts. The     patient was started on heparin and Coumadin. Carotid Dopplers were     ordered and negative for ICA stenosis and source of embolus. The patient     was evaluated by the neuro team, and there was no indication for further  therapy. The patient was hospitalized until he was therapeutic on     Coumadin.  2. Hypertriglyceridemia. We started the patient on Lopid during this     admission.   DISCHARGE LABORATORY DATA:  Hemoglobin 13.7. Homocysteine level was 10.93.  Cholesterol was 184, triglycerides 272, HDL 32, LDL 98.   MEDICATIONS AT DISCHARGE:  1. Lovenox 85 mg one shot q.12h. until Monday.  2. Coumadin 10 mg q.d.  3. Lopressor 100 mg b.i.d.  4. Lopid 600 mg b.i.d.  5. Norvasc 5 mg a day.  6. Baby aspirin 81 mg a day.  7. Nexium 40 mg b.i.d.    FOLLOW UP:  The patient was followed by the Coumadin clinic for Coumadin  management and followup with Dr. Posey Rea in about two weeks.     Cornell Barman, P.A. [facility]  Rene Paci, M.D. [facility]    LC/MEDQ  D:  07/09/2003  T:  07/09/2003  Job:  [id number]

## 2011-01-13 NOTE — Op Note (Signed)
[facility]  Patient:    Douglas [name], Douglas [name] Visit Number: [id number] MRN: [id number]          Service Type: DSU Location: DAY Attending Physician:  Cain Sieve Proc. Date: 05/09/01 Admit Date:  05/09/2001                             Operative Report  PREOPERATIVE DIAGNOSES: 1. Left shoulder rotator cuff tendinosis with impingement syndrome. 2. Left shoulder labral tear. 3. Left shoulder acromioclavicular joint arthrosis.  POSTOPERATIVE DIAGNOSES: 1. Left shoulder rotator cuff tendinosis with impingement syndrome. 2. Left shoulder labral tear. 3. Left shoulder acromioclavicular joint arthrosis. 4. Previous complete rupture of the long head of the biceps tendon.  PROCEDURES: 1. Left shoulder glenohumeral joint diagnostic arthroscopy. 2. Left shoulder glenohumeral joint extensive synovectomy. 3. Debridement of extensive degenerative labral tear left glenohumeral joint. 4. Arthroscopic subacromial decompression and bursectomy. 5. Arthroscopic distal clavicle resection.  SURGEON:  Vania Rea. Supple, M.D.  ANESTHESIA:  General endotracheal as well as preoperative intrascalene block.  ESTIMATED BLOOD LOSS:  Minimal.  HISTORY:  Mr. Reust is a 75 year old gentleman, who has had ongoing difficulties with left shoulder pain and a clinical examination showing a markedly positive impingement sign with weakness of the rotator cuff. Radiographs confirm advanced arthrosis of the [facility] joint, and an MRI scan does confirm tendinosis of the rotator cuff.  Due to his ongoing pain and functional limitations, he is brought to the operating room at this time for the above-outlined procedures.  Preoperatively, he was counseled on treatment options as well as risks versus benefits thereof.  Possible complications of bleeding, infection, neurovascular injury, persistence of pain, loss of motion, and possible need for additional surgery were reviewed.  He  understands and accepts and agrees with our planned procedure.  DESCRIPTION OF PROCEDURE:  After undergoing routine preoperative evaluation, the patient had an intrascalene block placed in the holding area by the Anesthesia Department.  He was then placed supine on the operating table where he underwent smooth induction of general endotracheal anesthesia.  He was rolled to right lateral decubitus position on a beanbag and appropriately padded and protected.  Left arm was suspended at the 70/30 position with 10 pounds of traction.  Examination under anesthesia showed no evidence for glenohumeral instability.  The left shoulder girdle region was then sterilely prepped and draped in the standard fashion.  He did received prophylactic antibiotics.  A standard posterior portal was established into the glenohumeral joint, and an anterior portal was established under direct visualization.  The examination showed previous complete rupture of the long head of the biceps tendon.  I had suspected based on his clinical exam with marked distal retraction of the bicepss muscle belly.  The stump of the bicep was quite mobile, and there was extensive degenerative tearing of the anterior, superior, and posterior aspects of the labrum.  A shaver was introduced and used to debride these areas aggressively down to stable-appearing labral tissue.  The glenohumeral and articular surfaces were actually in quite good condition.  There was extensive synovitis involving the superior aspect of the glenohumeral joint, and this was debrided.  We also performed an extensive debridement of a partial-thickness, articular-sided rotator cuff tear.  This area was probed and felt to have a good remaining thickness of the rotator cuff.  We did place a tag suture of 0 PDS at the level of the most significant fraying noted on the  joint side.  At the completion of the glenohumeral arthroscopy, the arm was then dropped down  to approximately 30 degrees of abduction with the arthroscope introduced into the subacromial space through the posterior portal, and a direct lateral portal was established in the subacromial space.  Abundant proliferative bursal tissue was excised with the combination of the shaver and the Arthrex wand. The wand was then used to remove the periosteum from the inner surface at the anterior half of the acromion.  A bur was then used to perform the subacromial decompression, creating a type 1 acromial morphology.  The CA ligament was divided and resected distally with the Arthrex wand used for hemostasis.  We then established a portal directly anterior to the distal clavicle, and a bur was then used to perform and distal clavicle resection.  Care was taken to make sure that the entire circumference of the distal clavicle could be visualized to ensure adequate removal of bone.  We then completed the subacromial bursectomy and inspected carefully the bursal surface of the rotator cuff.  In particular, we evaluated the area surrounding the tag suture, and this was probed and showed that it had maintained excellent thickness and caliber of the tendon.  After the subacromial bursectomy was completed, the vapor was used for terminal hemostasis.  Fluid and instruments were then removed.  The portals were closed with Steri-Strips.  A bulky dry dressing was then taped about the left shoulder.  The left arm was placed in a sling.  The patient was then rolled supine, extubated, and taken to recovery room in stable condition. Attending Physician:  Cain Sieve DD:  05/09/01 TD:  05/09/01 Job: [id number]

## 2011-01-20 ENCOUNTER — Ambulatory Visit (INDEPENDENT_AMBULATORY_CARE_PROVIDER_SITE_OTHER): Payer: Medicare Other | Admitting: *Deleted

## 2011-01-20 DIAGNOSIS — G459 Transient cerebral ischemic attack, unspecified: Secondary | ICD-10-CM

## 2011-01-20 DIAGNOSIS — I6350 Cerebral infarction due to unspecified occlusion or stenosis of unspecified cerebral artery: Secondary | ICD-10-CM

## 2011-01-20 DIAGNOSIS — Z8679 Personal history of other diseases of the circulatory system: Secondary | ICD-10-CM

## 2011-01-20 DIAGNOSIS — I639 Cerebral infarction, unspecified: Secondary | ICD-10-CM

## 2011-01-24 ENCOUNTER — Encounter: Payer: Self-pay | Admitting: Internal Medicine

## 2011-01-24 ENCOUNTER — Telehealth: Payer: Self-pay | Admitting: *Deleted

## 2011-01-24 ENCOUNTER — Ambulatory Visit (INDEPENDENT_AMBULATORY_CARE_PROVIDER_SITE_OTHER): Payer: Medicare Other | Admitting: Internal Medicine

## 2011-01-24 DIAGNOSIS — I739 Peripheral vascular disease, unspecified: Secondary | ICD-10-CM

## 2011-01-24 DIAGNOSIS — J309 Allergic rhinitis, unspecified: Secondary | ICD-10-CM

## 2011-01-24 DIAGNOSIS — R232 Flushing: Secondary | ICD-10-CM

## 2011-01-24 DIAGNOSIS — D6859 Other primary thrombophilia: Secondary | ICD-10-CM

## 2011-01-24 DIAGNOSIS — I10 Essential (primary) hypertension: Secondary | ICD-10-CM

## 2011-01-24 DIAGNOSIS — J45909 Unspecified asthma, uncomplicated: Secondary | ICD-10-CM

## 2011-01-24 DIAGNOSIS — R259 Unspecified abnormal involuntary movements: Secondary | ICD-10-CM

## 2011-01-24 NOTE — Assessment & Plan Note (Signed)
On Rx 

## 2011-01-24 NOTE — Telephone Encounter (Signed)
Pls ref him to Billing or to [employer]

## 2011-01-24 NOTE — Progress Notes (Signed)
  Subjective:    Patient ID: Douglas [name], male    DOB: 10/31/1935, 75 y.o.   MRN: [id number]  HPI  The patient presents for a follow-up of  chronic hypertension, chronic dyslipidemia, memory loss controlled with medicines    Review of Systems  Constitutional: Negative for diaphoresis and fatigue.  HENT: Negative for neck stiffness and sinus pressure.   Respiratory: Negative for cough.   Cardiovascular: Negative for leg swelling.  Gastrointestinal: Negative for constipation and anal bleeding.  Neurological: Negative for numbness.  Psychiatric/Behavioral: Positive for decreased concentration. Negative for suicidal ideas, behavioral problems, confusion and agitation. The patient is not nervous/anxious.        Objective:   Physical Exam  Constitutional: He is oriented to person, place, and time. He appears well-developed. No distress.  HENT:  Mouth/Throat: Oropharynx is clear and moist.  Eyes: Conjunctivae are normal. Pupils are equal, round, and reactive to light.  Neck: Normal range of motion. No JVD present. No thyromegaly present.  Cardiovascular: Normal rate, regular rhythm, normal heart sounds and intact distal pulses.  Exam reveals no gallop and no friction rub.   No murmur heard. Pulmonary/Chest: Effort normal and breath sounds normal. No respiratory distress. He has no wheezes. He has no rales. He exhibits no tenderness.  Abdominal: Soft. Bowel sounds are normal. He exhibits no distension and no mass. There is no tenderness. There is no rebound and no guarding.  Musculoskeletal: Normal range of motion. He exhibits no edema and no tenderness.  Lymphadenopathy:    He has no cervical adenopathy.  Neurological: He is alert and oriented to person, place, and time. He has normal reflexes. No cranial nerve deficit. He exhibits normal muscle tone. Coordination normal.  Skin: Skin is warm and dry. No rash noted.  Psychiatric: His behavior is normal. Judgment and thought content  normal.        Lab Results  Component Value Date   WBC 5.2 11/09/2010   HGB 14.1 11/09/2010   HCT 40.8 11/09/2010   PLT 128.0* 11/09/2010   CHOL 190 11/09/2010   TRIG 159.0* 11/09/2010   HDL 33.90* 11/09/2010   LDLDIRECT 137.9 05/11/2010   ALT 21 11/09/2010   AST 24 11/09/2010   NA 137 11/09/2010   K 4.9 11/09/2010   CL 103 11/09/2010   CREATININE 1.2 11/09/2010   BUN 20 11/09/2010   CO2 28 11/09/2010   TSH 1.43 11/09/2010   PSA 2.07 11/09/2010   INR 2.7 01/20/2011     Assessment & Plan:  HYPERCOAGULABLE STATE, PRIMARY On Rx  HYPERTENSION On Rx  TREMOR On Rx  REACTIVE AIRWAY DISEASE Better.  FLUSHING Resolved  PERIPHERAL VASCULAR DISEASE On Rx  ALLERGIC RHINITIS On Rx    Dementia-  We may need to start Namenda

## 2011-01-24 NOTE — Assessment & Plan Note (Signed)
Better  

## 2011-01-24 NOTE — Patient Instructions (Signed)
Normal BP<130/85 

## 2011-01-24 NOTE — Assessment & Plan Note (Signed)
Resolved

## 2011-01-24 NOTE — Telephone Encounter (Signed)
Patient requesting a call from Dr Posey Rea regarding billing problem he discussed w/MD earlier today.

## 2011-01-25 NOTE — Telephone Encounter (Signed)
Spoke w/pt - billing issue addressed with Ruby in billing, she will adjust and contact pt.

## 2011-01-29 ENCOUNTER — Other Ambulatory Visit: Payer: Self-pay | Admitting: Internal Medicine

## 2011-02-16 ENCOUNTER — Ambulatory Visit (INDEPENDENT_AMBULATORY_CARE_PROVIDER_SITE_OTHER): Payer: Medicare Other | Admitting: *Deleted

## 2011-02-16 DIAGNOSIS — I6350 Cerebral infarction due to unspecified occlusion or stenosis of unspecified cerebral artery: Secondary | ICD-10-CM

## 2011-02-16 DIAGNOSIS — Z8679 Personal history of other diseases of the circulatory system: Secondary | ICD-10-CM

## 2011-02-16 DIAGNOSIS — I639 Cerebral infarction, unspecified: Secondary | ICD-10-CM

## 2011-02-16 DIAGNOSIS — G459 Transient cerebral ischemic attack, unspecified: Secondary | ICD-10-CM

## 2011-02-17 ENCOUNTER — Encounter: Payer: Medicare Other | Admitting: *Deleted

## 2011-03-15 ENCOUNTER — Other Ambulatory Visit: Payer: Self-pay | Admitting: Internal Medicine

## 2011-03-17 ENCOUNTER — Ambulatory Visit (INDEPENDENT_AMBULATORY_CARE_PROVIDER_SITE_OTHER): Payer: Medicare Other | Admitting: *Deleted

## 2011-03-17 DIAGNOSIS — G459 Transient cerebral ischemic attack, unspecified: Secondary | ICD-10-CM

## 2011-03-17 DIAGNOSIS — I6350 Cerebral infarction due to unspecified occlusion or stenosis of unspecified cerebral artery: Secondary | ICD-10-CM

## 2011-03-17 DIAGNOSIS — I639 Cerebral infarction, unspecified: Secondary | ICD-10-CM

## 2011-03-17 DIAGNOSIS — Z8679 Personal history of other diseases of the circulatory system: Secondary | ICD-10-CM

## 2011-03-17 LAB — POCT INR: INR: 2.2

## 2011-04-06 ENCOUNTER — Other Ambulatory Visit: Payer: Self-pay | Admitting: Internal Medicine

## 2011-04-14 ENCOUNTER — Ambulatory Visit (INDEPENDENT_AMBULATORY_CARE_PROVIDER_SITE_OTHER): Payer: Medicare Other | Admitting: *Deleted

## 2011-04-14 DIAGNOSIS — Z8679 Personal history of other diseases of the circulatory system: Secondary | ICD-10-CM

## 2011-04-14 DIAGNOSIS — I6350 Cerebral infarction due to unspecified occlusion or stenosis of unspecified cerebral artery: Secondary | ICD-10-CM

## 2011-04-14 DIAGNOSIS — G459 Transient cerebral ischemic attack, unspecified: Secondary | ICD-10-CM

## 2011-04-14 DIAGNOSIS — I639 Cerebral infarction, unspecified: Secondary | ICD-10-CM

## 2011-04-27 ENCOUNTER — Other Ambulatory Visit (INDEPENDENT_AMBULATORY_CARE_PROVIDER_SITE_OTHER): Payer: Medicare Other

## 2011-04-27 ENCOUNTER — Encounter: Payer: Self-pay | Admitting: Internal Medicine

## 2011-04-27 ENCOUNTER — Ambulatory Visit (INDEPENDENT_AMBULATORY_CARE_PROVIDER_SITE_OTHER): Payer: Medicare Other | Admitting: Internal Medicine

## 2011-04-27 DIAGNOSIS — M1990 Unspecified osteoarthritis, unspecified site: Secondary | ICD-10-CM

## 2011-04-27 DIAGNOSIS — M545 Low back pain: Secondary | ICD-10-CM

## 2011-04-27 DIAGNOSIS — I6350 Cerebral infarction due to unspecified occlusion or stenosis of unspecified cerebral artery: Secondary | ICD-10-CM

## 2011-04-27 DIAGNOSIS — I639 Cerebral infarction, unspecified: Secondary | ICD-10-CM

## 2011-04-27 DIAGNOSIS — N400 Enlarged prostate without lower urinary tract symptoms: Secondary | ICD-10-CM

## 2011-04-27 DIAGNOSIS — I10 Essential (primary) hypertension: Secondary | ICD-10-CM

## 2011-04-27 DIAGNOSIS — E785 Hyperlipidemia, unspecified: Secondary | ICD-10-CM

## 2011-04-27 DIAGNOSIS — I739 Peripheral vascular disease, unspecified: Secondary | ICD-10-CM

## 2011-04-27 DIAGNOSIS — J309 Allergic rhinitis, unspecified: Secondary | ICD-10-CM

## 2011-04-27 DIAGNOSIS — R413 Other amnesia: Secondary | ICD-10-CM

## 2011-04-27 DIAGNOSIS — R259 Unspecified abnormal involuntary movements: Secondary | ICD-10-CM

## 2011-04-27 DIAGNOSIS — D6859 Other primary thrombophilia: Secondary | ICD-10-CM

## 2011-04-27 LAB — CBC WITH DIFFERENTIAL/PLATELET
Basophils Relative: 0.8 % (ref 0.0–3.0)
Eosinophils Relative: 3.6 % (ref 0.0–5.0)
HCT: 41 % (ref 39.0–52.0)
MCV: 89.8 fl (ref 78.0–100.0)
Monocytes Absolute: 0.4 10*3/uL (ref 0.1–1.0)
Monocytes Relative: 6.5 % (ref 3.0–12.0)
Neutrophils Relative %: 69 % (ref 43.0–77.0)
RBC: 4.56 Mil/uL (ref 4.22–5.81)
WBC: 5.9 10*3/uL (ref 4.5–10.5)

## 2011-04-27 LAB — TSH: TSH: 1.48 u[IU]/mL (ref 0.35–5.50)

## 2011-04-27 MED ORDER — IPRATROPIUM BROMIDE 0.06 % NA SOLN: 2.0000 | Freq: Four times a day (QID) | NASAL | Status: DC

## 2011-04-27 MED ORDER — DONEPEZIL HCL 10 MG PO TABS: 10.0000 mg | ORAL_TABLET | Freq: Every day | ORAL | Status: DC

## 2011-04-27 NOTE — Progress Notes (Signed)
  Subjective:    Patient ID: Douglas [name], male    DOB: [birth year], 75 y.o.   MRN: [id number]  HPI  The patient presents for a follow-up of  chronic hypertension, chronic dyslipidemia, CVA, controlled with medicines. F/u memory loss - stable C/o B OA - bad pain    Review of Systems  Constitutional: Negative for appetite change, fatigue and unexpected weight change.  HENT: Negative for nosebleeds, congestion, sore throat, sneezing, trouble swallowing and neck pain.   Eyes: Negative for itching and visual disturbance.  Respiratory: Negative for cough.   Cardiovascular: Negative for chest pain, palpitations and leg swelling.  Gastrointestinal: Negative for nausea, diarrhea, blood in stool and abdominal distention.  Genitourinary: Negative for frequency and hematuria.  Musculoskeletal: Positive for back pain, arthralgias and gait problem (knee pain B). Negative for joint swelling.  Skin: Negative for rash.  Neurological: Negative for dizziness, tremors, speech difficulty and weakness.  Psychiatric/Behavioral: Negative for sleep disturbance, dysphoric mood and agitation. The patient is nervous/anxious.        Objective:   Physical Exam  Constitutional: He is oriented to person, place, and time. He appears well-developed and well-nourished.  HENT:  Mouth/Throat: Oropharynx is clear and moist.  Eyes: Conjunctivae are normal. Pupils are equal, round, and reactive to light.  Neck: Normal range of motion. No JVD present. No thyromegaly present.  Cardiovascular: Normal rate, regular rhythm, normal heart sounds and intact distal pulses.  Exam reveals no gallop and no friction rub.   No murmur heard. Pulmonary/Chest: Effort normal and breath sounds normal. No respiratory distress. He has no wheezes. He has no rales. He exhibits no tenderness.  Abdominal: Soft. Bowel sounds are normal. He exhibits no distension and no mass. There is no tenderness. There is no rebound and no guarding.    Musculoskeletal: Normal range of motion. He exhibits tenderness (B knees are tender). He exhibits no edema.  Lymphadenopathy:    He has no cervical adenopathy.  Neurological: He is alert and oriented to person, place, and time. He has normal reflexes. No cranial nerve deficit. He exhibits normal muscle tone. Coordination normal.  Skin: Skin is warm and dry. No rash noted.  Psychiatric: He has a normal mood and affect. His behavior is normal. Judgment and thought content normal.   Lab Results  Component Value Date   WBC 5.2 11/09/2010   HGB 14.1 11/09/2010   HCT 40.8 11/09/2010   PLT 128.0* 11/09/2010   CHOL 190 11/09/2010   TRIG 159.0* 11/09/2010   HDL 33.90* 11/09/2010   LDLDIRECT 137.9 05/11/2010   ALT 21 11/09/2010   AST 24 11/09/2010   NA 137 11/09/2010   K 4.9 11/09/2010   CL 103 11/09/2010   CREATININE 1.2 11/09/2010   BUN 20 11/09/2010   CO2 28 11/09/2010   TSH 1.43 11/09/2010   PSA 2.07 11/09/2010   INR 2.5 04/14/2011          Assessment & Plan:

## 2011-04-27 NOTE — Assessment & Plan Note (Signed)
On Rx 

## 2011-04-27 NOTE — Assessment & Plan Note (Signed)
On RX 

## 2011-04-27 NOTE — Assessment & Plan Note (Signed)
Neurol appt tomorrow Cont RX

## 2011-04-27 NOTE — Assessment & Plan Note (Signed)
Dr Grapey 

## 2011-04-27 NOTE — Assessment & Plan Note (Signed)
S/p ortho eval  - advised TKR

## 2011-05-12 ENCOUNTER — Ambulatory Visit (INDEPENDENT_AMBULATORY_CARE_PROVIDER_SITE_OTHER): Payer: Medicare Other | Admitting: *Deleted

## 2011-05-12 DIAGNOSIS — Z8679 Personal history of other diseases of the circulatory system: Secondary | ICD-10-CM

## 2011-05-12 DIAGNOSIS — I6350 Cerebral infarction due to unspecified occlusion or stenosis of unspecified cerebral artery: Secondary | ICD-10-CM

## 2011-05-12 DIAGNOSIS — I639 Cerebral infarction, unspecified: Secondary | ICD-10-CM

## 2011-05-12 DIAGNOSIS — G459 Transient cerebral ischemic attack, unspecified: Secondary | ICD-10-CM

## 2011-05-12 LAB — POCT INR: INR: 2.3

## 2011-05-19 LAB — LIPID PANEL
LDL Cholesterol: 42
Triglycerides: 95
VLDL: 19

## 2011-05-19 LAB — CK TOTAL AND CKMB (NOT AT ARMC)
CK, MB: 2.3
Total CK: 109

## 2011-05-19 LAB — PROTIME-INR
INR: 5.2
Prothrombin Time: 43.2 — ABNORMAL HIGH
Prothrombin Time: 50.4 — ABNORMAL HIGH

## 2011-05-19 LAB — I-STAT 8, (EC8 V) (CONVERTED LAB)
BUN: 27 — ABNORMAL HIGH
Chloride: 112
HCT: 35 — ABNORMAL LOW
Hemoglobin: 11.9 — ABNORMAL LOW
Operator id: 288831
Sodium: 142

## 2011-05-19 LAB — POCT I-STAT CREATININE
Creatinine, Ser: 1.2
Operator id: 288831

## 2011-05-19 LAB — CBC
MCV: 87.8
RBC: 3.89 — ABNORMAL LOW
WBC: 4.7

## 2011-05-19 LAB — CARDIAC PANEL(CRET KIN+CKTOT+MB+TROPI)
CK, MB: 1.8
Relative Index: INVALID

## 2011-05-19 LAB — BASIC METABOLIC PANEL
BUN: 17
CO2: 22
Chloride: 114 — ABNORMAL HIGH
Creatinine, Ser: 0.92
GFR calc Af Amer: >60
Glucose, Bld: 105 — ABNORMAL HIGH

## 2011-05-26 ENCOUNTER — Ambulatory Visit (INDEPENDENT_AMBULATORY_CARE_PROVIDER_SITE_OTHER): Payer: Medicare Other | Admitting: *Deleted

## 2011-05-26 DIAGNOSIS — Z23 Encounter for immunization: Secondary | ICD-10-CM

## 2011-05-26 LAB — URINALYSIS, ROUTINE W REFLEX MICROSCOPIC
Glucose, UA: NEGATIVE
pH: 6.5

## 2011-05-26 LAB — POCT I-STAT, CHEM 8
BUN: 19
Calcium, Ion: 1.12
Creatinine, Ser: 1.2
Glucose, Bld: 142 — ABNORMAL HIGH
TCO2: 23

## 2011-05-26 LAB — DIFFERENTIAL
Eosinophils Relative: 4
Lymphocytes Relative: 28
Lymphs Abs: 1.6
Monocytes Absolute: 0.4

## 2011-05-26 LAB — CBC
HCT: 42.3
Hemoglobin: 14.4
RDW: 15.4
WBC: 5.9

## 2011-05-26 LAB — APTT: aPTT: 33

## 2011-05-26 LAB — PROTIME-INR: Prothrombin Time: 24.1 — ABNORMAL HIGH

## 2011-05-29 LAB — DIFFERENTIAL
Basophils Absolute: 0
Eosinophils Absolute: 0.1
Lymphs Abs: 1
Neutrophils Relative %: 71

## 2011-05-29 LAB — POCT I-STAT, CHEM 8
Calcium, Ion: 1.13
Chloride: 109
HCT: 38 — ABNORMAL LOW
Hemoglobin: 12.9 — ABNORMAL LOW
TCO2: 25

## 2011-05-29 LAB — CBC
MCV: 89.6
Platelets: 116 — ABNORMAL LOW
RDW: 15.1
WBC: 5.2

## 2011-05-29 LAB — PROTIME-INR
INR: 2.8 — ABNORMAL HIGH
Prothrombin Time: 31.2 — ABNORMAL HIGH

## 2011-06-09 ENCOUNTER — Ambulatory Visit (INDEPENDENT_AMBULATORY_CARE_PROVIDER_SITE_OTHER): Payer: Medicare Other | Admitting: *Deleted

## 2011-06-09 DIAGNOSIS — I639 Cerebral infarction, unspecified: Secondary | ICD-10-CM

## 2011-06-09 DIAGNOSIS — G459 Transient cerebral ischemic attack, unspecified: Secondary | ICD-10-CM

## 2011-06-09 DIAGNOSIS — I6350 Cerebral infarction due to unspecified occlusion or stenosis of unspecified cerebral artery: Secondary | ICD-10-CM

## 2011-06-09 DIAGNOSIS — Z8679 Personal history of other diseases of the circulatory system: Secondary | ICD-10-CM

## 2011-06-09 LAB — POCT INR: INR: 2.3

## 2011-06-14 ENCOUNTER — Other Ambulatory Visit: Payer: Self-pay | Admitting: Internal Medicine

## 2011-06-19 ENCOUNTER — Other Ambulatory Visit: Payer: Self-pay | Admitting: Internal Medicine

## 2011-06-19 ENCOUNTER — Telehealth: Payer: Self-pay | Admitting: *Deleted

## 2011-06-19 NOTE — Telephone Encounter (Signed)
Requested Medications     HYDROcodone-acetaminophen (VICODIN) 5-500 MG per tablet [Pharmacy Med Name: HYDROCO/APAP 5-500 TAB AMNE]   TAKE ONE TABLET BY MOUTH FOUR TIMES DAILY AS NEEDED   Disp: 120 tablet R: 2 Start: 06/19/2011

## 2011-06-19 NOTE — Telephone Encounter (Signed)
OK to fill this prescription with additional refills x2 Thank you!  

## 2011-06-19 NOTE — Telephone Encounter (Signed)
Requested Medications     HYDROcodone-acetaminophen (VICODIN) 5-500 MG per tablet [Pharmacy Med Name: HYDROCO/APAP 5-500 TAB AMNE]   TAKE ONE TABLET BY MOUTH FOUR TIMES DAILY AS NEEDED   Disp: 120 tablet R: 2 Start: 06/19/2011      KMART Aaron Edelman

## 2011-06-20 MED ORDER — HYDROCODONE-ACETAMINOPHEN 5-500 MG PO TABS: 1.0000 | ORAL_TABLET | Freq: Four times a day (QID) | ORAL | Status: DC | PRN

## 2011-07-07 ENCOUNTER — Encounter: Payer: Medicare Other | Admitting: *Deleted

## 2011-07-10 ENCOUNTER — Ambulatory Visit (INDEPENDENT_AMBULATORY_CARE_PROVIDER_SITE_OTHER): Payer: Medicare Other | Admitting: *Deleted

## 2011-07-10 DIAGNOSIS — I6350 Cerebral infarction due to unspecified occlusion or stenosis of unspecified cerebral artery: Secondary | ICD-10-CM

## 2011-07-10 DIAGNOSIS — G459 Transient cerebral ischemic attack, unspecified: Secondary | ICD-10-CM

## 2011-07-10 DIAGNOSIS — Z8679 Personal history of other diseases of the circulatory system: Secondary | ICD-10-CM

## 2011-07-10 DIAGNOSIS — I639 Cerebral infarction, unspecified: Secondary | ICD-10-CM

## 2011-07-28 ENCOUNTER — Ambulatory Visit (INDEPENDENT_AMBULATORY_CARE_PROVIDER_SITE_OTHER): Payer: Medicare Other | Admitting: Internal Medicine

## 2011-07-28 ENCOUNTER — Encounter: Payer: Self-pay | Admitting: Internal Medicine

## 2011-07-28 ENCOUNTER — Other Ambulatory Visit (INDEPENDENT_AMBULATORY_CARE_PROVIDER_SITE_OTHER): Payer: Medicare Other

## 2011-07-28 VITALS — BP 150/68 | Temp 97.5°F | Wt 179.0 lb

## 2011-07-28 DIAGNOSIS — I10 Essential (primary) hypertension: Secondary | ICD-10-CM

## 2011-07-28 DIAGNOSIS — R413 Other amnesia: Secondary | ICD-10-CM

## 2011-07-28 DIAGNOSIS — I6350 Cerebral infarction due to unspecified occlusion or stenosis of unspecified cerebral artery: Secondary | ICD-10-CM

## 2011-07-28 DIAGNOSIS — E785 Hyperlipidemia, unspecified: Secondary | ICD-10-CM

## 2011-07-28 DIAGNOSIS — M545 Low back pain, unspecified: Secondary | ICD-10-CM

## 2011-07-28 DIAGNOSIS — I639 Cerebral infarction, unspecified: Secondary | ICD-10-CM

## 2011-07-28 DIAGNOSIS — D6859 Other primary thrombophilia: Secondary | ICD-10-CM

## 2011-07-28 DIAGNOSIS — G459 Transient cerebral ischemic attack, unspecified: Secondary | ICD-10-CM

## 2011-07-28 DIAGNOSIS — M1990 Unspecified osteoarthritis, unspecified site: Secondary | ICD-10-CM

## 2011-07-28 LAB — BASIC METABOLIC PANEL
Calcium: 8.9 mg/dL (ref 8.4–10.5)
GFR: 71.23 mL/min (ref >60.00)
Sodium: 141 mEq/L (ref 135–145)

## 2011-07-28 LAB — CBC WITH DIFFERENTIAL/PLATELET
Basophils Absolute: 0 10*3/uL (ref 0.0–0.1)
Eosinophils Absolute: 0.2 10*3/uL (ref 0.0–0.7)
Lymphocytes Relative: 19.5 % (ref 12.0–46.0)
MCHC: 33.9 g/dL (ref 30.0–36.0)
MCV: 89.3 fl (ref 78.0–100.0)
Monocytes Absolute: 0.4 10*3/uL (ref 0.1–1.0)
Neutrophils Relative %: 70 % (ref 43.0–77.0)
Platelets: 122 10*3/uL — ABNORMAL LOW (ref 150.0–400.0)
RBC: 4.58 Mil/uL (ref 4.22–5.81)
RDW: 14.6 % (ref 11.5–14.6)

## 2011-07-28 MED ORDER — AMOXICILLIN 500 MG PO CAPS: 1000.0000 mg | ORAL_CAPSULE | Freq: Two times a day (BID) | ORAL | Status: AC

## 2011-07-28 NOTE — Assessment & Plan Note (Signed)
Continue with current prescription therapy as reflected on the Med list.  

## 2011-07-28 NOTE — Assessment & Plan Note (Signed)
On coumadin 

## 2011-07-28 NOTE — Progress Notes (Signed)
  Subjective:    Patient ID: Douglas [name], male    DOB: 13-Jan-1936, 75 y.o.   MRN: [id number]  HPI  The patient presents for a follow-up of  chronic hypertension, CVA, memory loss - better, chronic dyslipidemia, type 2 diabetes controlled with medicines    Review of Systems  Constitutional: Negative for appetite change, fatigue and unexpected weight change.  HENT: Negative for nosebleeds, congestion, sore throat, sneezing, trouble swallowing and neck pain.   Eyes: Negative for itching and visual disturbance.  Respiratory: Negative for cough.   Cardiovascular: Negative for chest pain, palpitations and leg swelling.  Gastrointestinal: Negative for nausea, diarrhea, blood in stool and abdominal distention.  Genitourinary: Negative for frequency and hematuria.  Musculoskeletal: Negative for back pain, joint swelling and gait problem.  Skin: Negative for rash.  Neurological: Negative for dizziness, tremors, speech difficulty and weakness.  Psychiatric/Behavioral: Negative for sleep disturbance, dysphoric mood and agitation. The patient is not nervous/anxious.        Objective:   Physical Exam  Constitutional: He is oriented to person, place, and time. He appears well-developed.  HENT:  Mouth/Throat: Oropharynx is clear and moist.  Eyes: Conjunctivae are normal. Pupils are equal, round, and reactive to light.  Neck: Normal range of motion. No JVD present. No thyromegaly present.  Cardiovascular: Normal rate, regular rhythm, normal heart sounds and intact distal pulses.  Exam reveals no gallop and no friction rub.   No murmur heard. Pulmonary/Chest: Effort normal and breath sounds normal. No respiratory distress. He has no wheezes. He has no rales. He exhibits no tenderness.  Abdominal: Soft. Bowel sounds are normal. He exhibits no distension and no mass. There is no tenderness. There is no rebound and no guarding.  Musculoskeletal: Normal range of motion. He exhibits no edema and  no tenderness.  Lymphadenopathy:    He has no cervical adenopathy.  Neurological: He is alert and oriented to person, place, and time. He has normal reflexes. No cranial nerve deficit. He exhibits normal muscle tone. Coordination normal.  Skin: Skin is warm and dry. No rash noted.  Psychiatric: He has a normal mood and affect. His behavior is normal. Judgment and thought content normal.          Assessment & Plan:

## 2011-07-28 NOTE — Assessment & Plan Note (Signed)
Some better 

## 2011-07-28 NOTE — Assessment & Plan Note (Signed)
Chronic knee OA B S/p ortho eval  - advised TKR  Potential benefits of a long term opioids use as well as potential risks (i.e. addiction risk, apnea etc) and complications (i.e. Somnolence, constipation and others) were explained to the patient and were aknowledged. Continue with current prescription therapy as reflected on the Med list.

## 2011-07-28 NOTE — Assessment & Plan Note (Signed)
No relapse 

## 2011-07-29 ENCOUNTER — Telehealth: Payer: Self-pay | Admitting: Internal Medicine

## 2011-07-29 NOTE — Telephone Encounter (Signed)
Stacey, please, inform patient that all labs are ok Thx 

## 2011-07-31 ENCOUNTER — Other Ambulatory Visit: Payer: Self-pay | Admitting: *Deleted

## 2011-07-31 MED ORDER — LOSARTAN POTASSIUM 100 MG PO TABS: 100.0000 mg | ORAL_TABLET | Freq: Every day | ORAL | Status: DC

## 2011-07-31 NOTE — Telephone Encounter (Signed)
Pt informed

## 2011-08-03 ENCOUNTER — Other Ambulatory Visit: Payer: Self-pay | Admitting: *Deleted

## 2011-08-03 MED ORDER — CLONIDINE HCL 0.1 MG PO TABS: 0.1000 mg | ORAL_TABLET | Freq: Three times a day (TID) | ORAL | Status: DC

## 2011-08-04 ENCOUNTER — Ambulatory Visit (INDEPENDENT_AMBULATORY_CARE_PROVIDER_SITE_OTHER): Payer: Medicare Other | Admitting: *Deleted

## 2011-08-04 DIAGNOSIS — I639 Cerebral infarction, unspecified: Secondary | ICD-10-CM

## 2011-08-04 DIAGNOSIS — G459 Transient cerebral ischemic attack, unspecified: Secondary | ICD-10-CM

## 2011-08-04 DIAGNOSIS — I6350 Cerebral infarction due to unspecified occlusion or stenosis of unspecified cerebral artery: Secondary | ICD-10-CM

## 2011-08-04 DIAGNOSIS — Z8679 Personal history of other diseases of the circulatory system: Secondary | ICD-10-CM

## 2011-08-08 ENCOUNTER — Telehealth: Payer: Self-pay | Admitting: Internal Medicine

## 2011-08-08 MED ORDER — PROMETHAZINE-CODEINE 6.25-10 MG/5ML PO SYRP: 5.0000 mL | ORAL_SOLUTION | ORAL | Status: AC | PRN

## 2011-08-08 NOTE — Telephone Encounter (Signed)
The pt called and stated he was being treated for congestion, but not he has developed a cough.  He is hoping to get a cough med with codiene (spelling ?) in it to resolve this issue.    Thanks!

## 2011-08-08 NOTE — Telephone Encounter (Signed)
Ok prom- cod

## 2011-08-08 NOTE — Telephone Encounter (Signed)
Pt informed

## 2011-08-11 ENCOUNTER — Telehealth: Payer: Self-pay | Admitting: Internal Medicine

## 2011-08-11 ENCOUNTER — Other Ambulatory Visit (INDEPENDENT_AMBULATORY_CARE_PROVIDER_SITE_OTHER): Payer: Medicare Other

## 2011-08-11 ENCOUNTER — Encounter: Payer: Self-pay | Admitting: Internal Medicine

## 2011-08-11 ENCOUNTER — Ambulatory Visit (INDEPENDENT_AMBULATORY_CARE_PROVIDER_SITE_OTHER): Payer: Medicare Other | Admitting: Internal Medicine

## 2011-08-11 VITALS — BP 148/70 | HR 80 | Temp 98.0°F | Resp 16 | Wt 173.0 lb

## 2011-08-11 DIAGNOSIS — R5381 Other malaise: Secondary | ICD-10-CM

## 2011-08-11 DIAGNOSIS — R531 Weakness: Secondary | ICD-10-CM

## 2011-08-11 DIAGNOSIS — IMO0001 Reserved for inherently not codable concepts without codable children: Secondary | ICD-10-CM

## 2011-08-11 DIAGNOSIS — R05 Cough: Secondary | ICD-10-CM

## 2011-08-11 DIAGNOSIS — M791 Myalgia, unspecified site: Secondary | ICD-10-CM

## 2011-08-11 LAB — URINALYSIS
Nitrite: NEGATIVE
Urobilinogen, UA: 0.2 (ref 0.0–1.0)

## 2011-08-11 LAB — SEDIMENTATION RATE: Sed Rate: 27 mm/hr — ABNORMAL HIGH (ref 0–22)

## 2011-08-11 LAB — CK: Total CK: 268 U/L — ABNORMAL HIGH (ref 7–232)

## 2011-08-11 MED ORDER — AMOXICILLIN 500 MG PO CAPS: 1000.0000 mg | ORAL_CAPSULE | Freq: Two times a day (BID) | ORAL | Status: AC

## 2011-08-11 MED ORDER — PREDNISONE 10 MG PO TABS: ORAL_TABLET | ORAL | Status: DC

## 2011-08-11 NOTE — Assessment & Plan Note (Signed)
Prednisone 10 mg: take 4 tabs a day x 3 days; then 3 tabs a day x 4 days; then 2 tabs a day x 4 days, then 1 tab a day x 6 days, then stop. Take pc.  Potential benefits of a short/long term steroid  use as well as potential risks  and complications were explained to the patient and were aknowledged.

## 2011-08-11 NOTE — Assessment & Plan Note (Signed)
Labs, UA Empiric abx and Prednisone

## 2011-08-11 NOTE — Telephone Encounter (Signed)
Douglas [name], please, inform patient that all labs are normal except for elevated muscle tests - c/w muscle inflamation. Meds - as we discussed Thx

## 2011-08-11 NOTE — Telephone Encounter (Signed)
Pt informed

## 2011-08-11 NOTE — Progress Notes (Signed)
  Subjective:    Patient ID: Douglas [name], male    DOB: 12/28/1935, 75 y.o.   MRN: [id number]  HPI  He ran 1/4 mi under stress 1 wk ago - now c/o muscle aches, back, shoulders  Review of Systems  Constitutional: Positive for fatigue. Negative for appetite change and unexpected weight change.  HENT: Negative for nosebleeds, congestion, sore throat, sneezing, trouble swallowing and neck pain.   Eyes: Negative for itching and visual disturbance.  Respiratory: Positive for cough.   Cardiovascular: Negative for chest pain, palpitations and leg swelling.  Gastrointestinal: Negative for nausea, diarrhea, blood in stool and abdominal distention.  Genitourinary: Negative for frequency and hematuria.  Musculoskeletal: Positive for myalgias. Negative for back pain, joint swelling and gait problem.  Skin: Negative for rash.  Neurological: Negative for dizziness, tremors, speech difficulty and weakness.  Psychiatric/Behavioral: Negative for sleep disturbance, dysphoric mood and agitation. The patient is not nervous/anxious.        Objective:   Physical Exam  Constitutional: He is oriented to person, place, and time. He appears well-developed.  HENT:  Mouth/Throat: Oropharynx is clear and moist.  Eyes: Conjunctivae are normal. Pupils are equal, round, and reactive to light.  Neck: Normal range of motion. No JVD present. No thyromegaly present.  Cardiovascular: Normal rate, regular rhythm, normal heart sounds and intact distal pulses.  Exam reveals no gallop and no friction rub.   No murmur heard. Pulmonary/Chest: Effort normal and breath sounds normal. No respiratory distress. He has no wheezes. He has no rales. He exhibits no tenderness.  Abdominal: Soft. Bowel sounds are normal. He exhibits no distension and no mass. There is no tenderness. There is no rebound and no guarding.  Musculoskeletal: Normal range of motion. He exhibits tenderness (muscles are tender and weak). He exhibits no  edema.  Lymphadenopathy:    He has no cervical adenopathy.  Neurological: He is alert and oriented to person, place, and time. He has normal reflexes. No cranial nerve deficit. He exhibits normal muscle tone. Coordination normal.  Skin: Skin is warm and dry. No rash noted.  Psychiatric: He has a normal mood and affect. His behavior is normal. Judgment and thought content normal.          Assessment & Plan:

## 2011-08-12 ENCOUNTER — Encounter: Payer: Self-pay | Admitting: Internal Medicine

## 2011-08-12 NOTE — Assessment & Plan Note (Signed)
Cough syr was given

## 2011-08-14 ENCOUNTER — Ambulatory Visit: Payer: Medicare Other | Admitting: Internal Medicine

## 2011-08-31 ENCOUNTER — Other Ambulatory Visit: Payer: Self-pay | Admitting: Internal Medicine

## 2011-08-31 MED ORDER — OMEPRAZOLE 20 MG PO CPDR: 20.0000 mg | DELAYED_RELEASE_CAPSULE | Freq: Every day | ORAL | Status: DC

## 2011-09-01 ENCOUNTER — Ambulatory Visit (INDEPENDENT_AMBULATORY_CARE_PROVIDER_SITE_OTHER): Payer: Medicare Other | Admitting: Internal Medicine

## 2011-09-01 ENCOUNTER — Encounter: Payer: Self-pay | Admitting: Internal Medicine

## 2011-09-01 DIAGNOSIS — IMO0001 Reserved for inherently not codable concepts without codable children: Secondary | ICD-10-CM | POA: Diagnosis not present

## 2011-09-01 DIAGNOSIS — J329 Chronic sinusitis, unspecified: Secondary | ICD-10-CM

## 2011-09-01 DIAGNOSIS — M791 Myalgia, unspecified site: Secondary | ICD-10-CM

## 2011-09-01 MED ORDER — AMOXICILLIN-POT CLAVULANATE 875-125 MG PO TABS: 1.0000 | ORAL_TABLET | Freq: Two times a day (BID) | ORAL | Status: AC

## 2011-09-01 MED ORDER — HYDROCODONE-ACETAMINOPHEN 5-500 MG PO TABS: 1.0000 | ORAL_TABLET | Freq: Four times a day (QID) | ORAL | Status: DC | PRN

## 2011-09-01 MED ORDER — AMOXICILLIN-POT CLAVULANATE 875-125 MG PO TABS: 1.0000 | ORAL_TABLET | Freq: Two times a day (BID) | ORAL | Status: DC

## 2011-09-01 NOTE — Patient Instructions (Signed)
Align 1 a day 

## 2011-09-01 NOTE — Assessment & Plan Note (Signed)
Elev CK

## 2011-09-01 NOTE — Progress Notes (Signed)
  Subjective:    Patient ID: Douglas [name], male    DOB: [birth year], 76 y.o.   MRN: [id number]  HPI  C/o sinusitis sx's not better C/o leg weakness - he fell twice  Review of Systems  Constitutional: Positive for fatigue. Negative for appetite change and unexpected weight change.  HENT: Positive for congestion, rhinorrhea, postnasal drip and sinus pressure. Negative for nosebleeds, sore throat, sneezing, trouble swallowing and neck pain.   Eyes: Negative for itching and visual disturbance.  Respiratory: Negative for cough.   Cardiovascular: Negative for chest pain, palpitations and leg swelling.  Gastrointestinal: Negative for nausea, diarrhea, blood in stool and abdominal distention.  Genitourinary: Negative for frequency and hematuria.  Musculoskeletal: Positive for gait problem. Negative for back pain, joint swelling and arthralgias.  Skin: Negative for rash.  Neurological: Negative for dizziness, tremors, speech difficulty and weakness.  Psychiatric/Behavioral: Negative for suicidal ideas, sleep disturbance, dysphoric mood and agitation. The patient is not nervous/anxious.        Objective:   Physical Exam  Constitutional: He is oriented to person, place, and time. He appears well-developed. No distress.  HENT:  Mouth/Throat: Oropharynx is clear and moist.       Nasal mucosa swelling, d/c  Eyes: Conjunctivae are normal. Pupils are equal, round, and reactive to light.  Neck: Normal range of motion. No JVD present. No thyromegaly present.  Cardiovascular: Normal rate, regular rhythm, normal heart sounds and intact distal pulses.  Exam reveals no gallop and no friction rub.   No murmur heard. Pulmonary/Chest: Effort normal and breath sounds normal. No respiratory distress. He has no wheezes. He has no rales. He exhibits no tenderness.  Abdominal: Soft. Bowel sounds are normal. He exhibits no distension and no mass. There is no tenderness. There is no rebound and no guarding.    Musculoskeletal: Normal range of motion. He exhibits no edema and no tenderness.  Lymphadenopathy:    He has no cervical adenopathy.  Neurological: He is alert and oriented to person, place, and time. He has normal reflexes. No cranial nerve deficit. He exhibits normal muscle tone. Coordination normal.  Skin: Skin is warm and dry. No rash noted.  Psychiatric: He has a normal mood and affect. His behavior is normal. Judgment and thought content normal.   Lab Results  Component Value Date   WBC 5.6 07/28/2011   HGB 13.9 07/28/2011   HCT 40.9 07/28/2011   PLT 122.0* 07/28/2011   GLUCOSE 101* 07/28/2011   CHOL 190 11/09/2010   TRIG 159.0* 11/09/2010   HDL 33.90* 11/09/2010   LDLDIRECT 137.9 05/11/2010   LDLCALC 124* 11/09/2010   ALT 21 11/09/2010   AST 24 11/09/2010   NA 141 07/28/2011   K 3.9 07/28/2011   CL 108 07/28/2011   CREATININE 1.1 07/28/2011   BUN 18 07/28/2011   CO2 28 07/28/2011   TSH 1.48 04/27/2011   PSA 2.07 11/09/2010   INR 2.1 08/04/2011          Assessment & Plan:

## 2011-09-01 NOTE — Assessment & Plan Note (Signed)
Start Augmentin 

## 2011-09-13 ENCOUNTER — Telehealth: Payer: Self-pay | Admitting: *Deleted

## 2011-09-13 NOTE — Telephone Encounter (Signed)
Pt was in office on 01.04.13 & prescribed Augmentin, c/o having cough for [5] weeks now and has began to run low-grade fever; declined OV for 01.17.13 Requesting Rx for cough to pharmacy.

## 2011-09-14 MED ORDER — PROMETHAZINE-CODEINE 6.25-10 MG/5ML PO SYRP: 5.0000 mL | ORAL_SOLUTION | ORAL | Status: AC | PRN

## 2011-09-14 NOTE — Telephone Encounter (Signed)
Rx phoned in.  Patient informed.

## 2011-09-14 NOTE — Telephone Encounter (Signed)
Ok to call in Pro-cod Thx

## 2011-09-14 NOTE — Telephone Encounter (Signed)
Sorry I closed it in error [employer], pls see Thx

## 2011-09-15 ENCOUNTER — Ambulatory Visit (INDEPENDENT_AMBULATORY_CARE_PROVIDER_SITE_OTHER): Payer: Medicare Other | Admitting: *Deleted

## 2011-09-15 DIAGNOSIS — I639 Cerebral infarction, unspecified: Secondary | ICD-10-CM

## 2011-09-15 DIAGNOSIS — I6350 Cerebral infarction due to unspecified occlusion or stenosis of unspecified cerebral artery: Secondary | ICD-10-CM | POA: Diagnosis not present

## 2011-09-15 DIAGNOSIS — Z8679 Personal history of other diseases of the circulatory system: Secondary | ICD-10-CM | POA: Diagnosis not present

## 2011-09-15 DIAGNOSIS — G459 Transient cerebral ischemic attack, unspecified: Secondary | ICD-10-CM | POA: Diagnosis not present

## 2011-09-18 ENCOUNTER — Other Ambulatory Visit: Payer: Self-pay | Admitting: *Deleted

## 2011-09-18 MED ORDER — TAMSULOSIN HCL 0.4 MG PO CAPS: 0.4000 mg | ORAL_CAPSULE | Freq: Every day | ORAL | Status: DC

## 2011-10-02 ENCOUNTER — Encounter: Payer: Self-pay | Admitting: Internal Medicine

## 2011-10-02 ENCOUNTER — Ambulatory Visit (INDEPENDENT_AMBULATORY_CARE_PROVIDER_SITE_OTHER): Payer: Medicare Other | Admitting: Internal Medicine

## 2011-10-02 VITALS — BP 102/70 | HR 88 | Temp 97.0°F | Resp 16 | Wt 170.0 lb

## 2011-10-02 DIAGNOSIS — K219 Gastro-esophageal reflux disease without esophagitis: Secondary | ICD-10-CM | POA: Diagnosis not present

## 2011-10-02 DIAGNOSIS — J329 Chronic sinusitis, unspecified: Secondary | ICD-10-CM

## 2011-10-02 DIAGNOSIS — I6350 Cerebral infarction due to unspecified occlusion or stenosis of unspecified cerebral artery: Secondary | ICD-10-CM

## 2011-10-02 DIAGNOSIS — R413 Other amnesia: Secondary | ICD-10-CM

## 2011-10-02 DIAGNOSIS — I639 Cerebral infarction, unspecified: Secondary | ICD-10-CM

## 2011-10-02 DIAGNOSIS — I10 Essential (primary) hypertension: Secondary | ICD-10-CM

## 2011-10-02 DIAGNOSIS — M545 Low back pain: Secondary | ICD-10-CM

## 2011-10-02 MED ORDER — DICLOFENAC SODIUM 1 % TD GEL: 1.0000 "application " | Freq: Four times a day (QID) | TRANSDERMAL | Status: DC

## 2011-10-02 NOTE — Assessment & Plan Note (Signed)
Continue with current prescription therapy as reflected on the Med list.  

## 2011-10-02 NOTE — Patient Instructions (Signed)
Start using Flonase nasal spray

## 2011-10-02 NOTE — Assessment & Plan Note (Signed)
Better. Use Flonase

## 2011-10-02 NOTE — Assessment & Plan Note (Signed)
Continue with current prescription therapy as reflected on the Med list. OA

## 2011-10-02 NOTE — Progress Notes (Signed)
Patient ID: Douglas [name], male   DOB: 06/19/1936, 76 y.o.   MRN: [id number]  Subjective:    Patient ID: Douglas [name], male    DOB: Feb 07, 1936, 76 y.o.   MRN: [id number]  Cough Associated symptoms include postnasal drip and rhinorrhea. Pertinent negatives include no chest pain, rash or sore throat.    F/u sinusitis sx's - better after abx, but not gone F/u leg weakness - no more falls  Review of Systems  Constitutional: Negative for appetite change, fatigue and unexpected weight change.  HENT: Positive for nosebleeds, rhinorrhea and postnasal drip. Negative for congestion, sore throat, sneezing, trouble swallowing, neck pain and sinus pressure.   Eyes: Negative for itching and visual disturbance.  Respiratory: Negative for cough.   Cardiovascular: Negative for chest pain, palpitations and leg swelling.  Gastrointestinal: Negative for nausea, diarrhea, blood in stool and abdominal distention.  Genitourinary: Negative for frequency and hematuria.  Musculoskeletal: Negative for back pain, joint swelling, arthralgias and gait problem.  Skin: Negative for rash.  Neurological: Negative for dizziness, tremors, speech difficulty and weakness.  Psychiatric/Behavioral: Negative for suicidal ideas, sleep disturbance, dysphoric mood and agitation. The patient is not nervous/anxious.        Objective:   Physical Exam  Constitutional: He is oriented to person, place, and time. He appears well-developed. No distress.  HENT:  Mouth/Throat: Oropharynx is clear and moist. No oropharyngeal exudate.       Nasal mucosa w/swelling, bloody d/c is still present  Eyes: Conjunctivae are normal. Pupils are equal, round, and reactive to light.  Neck: Normal range of motion. No JVD present. No thyromegaly present.  Cardiovascular: Normal rate, regular rhythm, normal heart sounds and intact distal pulses.  Exam reveals no gallop and no friction rub.   No murmur heard. Pulmonary/Chest: Effort normal  and breath sounds normal. No respiratory distress. He has no wheezes. He has no rales. He exhibits no tenderness.  Abdominal: Soft. Bowel sounds are normal. He exhibits no distension and no mass. There is no tenderness. There is no rebound and no guarding.  Musculoskeletal: Normal range of motion. He exhibits no edema and no tenderness.  Lymphadenopathy:    He has no cervical adenopathy.  Neurological: He is alert and oriented to person, place, and time. He has normal reflexes. No cranial nerve deficit. He exhibits normal muscle tone. Coordination normal.  Skin: Skin is warm and dry. No rash noted.  Psychiatric: He has a normal mood and affect. His behavior is normal. Judgment and thought content normal.   Lab Results  Component Value Date   WBC 5.6 07/28/2011   HGB 13.9 07/28/2011   HCT 40.9 07/28/2011   PLT 122.0* 07/28/2011   GLUCOSE 101* 07/28/2011   CHOL 190 11/09/2010   TRIG 159.0* 11/09/2010   HDL 33.90* 11/09/2010   LDLDIRECT 137.9 05/11/2010   LDLCALC 124* 11/09/2010   ALT 21 11/09/2010   AST 24 11/09/2010   NA 141 07/28/2011   K 3.9 07/28/2011   CL 108 07/28/2011   CREATININE 1.1 07/28/2011   BUN 18 07/28/2011   CO2 28 07/28/2011   TSH 1.48 04/27/2011   PSA 2.07 11/09/2010   INR 3.2 09/15/2011     Procedure Note :    Procedure :   Point of care (POC) sonography examination   Indication: sinus congestion, ongoing   Equipment used: Sonosite M-Turbo with HFL38x/13-6 MHz transducer linear probe. The images were stored in the unit and later transferred in storage.  The patient was  placed in a sitting upright position.  This study revealed no [place] evidence of maxillary sinusitis B (posterior sinus wall was not visualized)   Impression: No [place] evidence of maxillary sinusitis B        Assessment & Plan:

## 2011-10-07 ENCOUNTER — Encounter (HOSPITAL_COMMUNITY): Payer: Self-pay

## 2011-10-07 ENCOUNTER — Emergency Department (HOSPITAL_COMMUNITY): Payer: Medicare Other

## 2011-10-07 ENCOUNTER — Emergency Department (HOSPITAL_COMMUNITY)
Admission: EM | Admit: 2011-10-07 | Discharge: 2011-10-07 | Disposition: A | Discharge status: Home / Self Care | Payer: Medicare Other | Attending: Emergency Medicine | Admitting: Emergency Medicine

## 2011-10-07 DIAGNOSIS — R071 Chest pain on breathing: Secondary | ICD-10-CM | POA: Insufficient documentation

## 2011-10-07 DIAGNOSIS — R918 Other nonspecific abnormal finding of lung field: Secondary | ICD-10-CM | POA: Diagnosis not present

## 2011-10-07 DIAGNOSIS — R059 Cough, unspecified: Secondary | ICD-10-CM | POA: Diagnosis not present

## 2011-10-07 DIAGNOSIS — G459 Transient cerebral ischemic attack, unspecified: Secondary | ICD-10-CM | POA: Diagnosis not present

## 2011-10-07 DIAGNOSIS — Z8679 Personal history of other diseases of the circulatory system: Secondary | ICD-10-CM | POA: Insufficient documentation

## 2011-10-07 DIAGNOSIS — M549 Dorsalgia, unspecified: Secondary | ICD-10-CM | POA: Diagnosis not present

## 2011-10-07 DIAGNOSIS — I10 Essential (primary) hypertension: Secondary | ICD-10-CM | POA: Insufficient documentation

## 2011-10-07 DIAGNOSIS — R05 Cough: Secondary | ICD-10-CM | POA: Insufficient documentation

## 2011-10-07 DIAGNOSIS — J189 Pneumonia, unspecified organism: Secondary | ICD-10-CM | POA: Diagnosis not present

## 2011-10-07 DIAGNOSIS — R61 Generalized hyperhidrosis: Secondary | ICD-10-CM | POA: Insufficient documentation

## 2011-10-07 DIAGNOSIS — K219 Gastro-esophageal reflux disease without esophagitis: Secondary | ICD-10-CM | POA: Insufficient documentation

## 2011-10-07 DIAGNOSIS — R509 Fever, unspecified: Secondary | ICD-10-CM | POA: Insufficient documentation

## 2011-10-07 DIAGNOSIS — B349 Viral infection, unspecified: Secondary | ICD-10-CM

## 2011-10-07 DIAGNOSIS — Z8673 Personal history of transient ischemic attack (TIA), and cerebral infarction without residual deficits: Secondary | ICD-10-CM | POA: Diagnosis not present

## 2011-10-07 DIAGNOSIS — E785 Hyperlipidemia, unspecified: Secondary | ICD-10-CM | POA: Insufficient documentation

## 2011-10-07 DIAGNOSIS — B9789 Other viral agents as the cause of diseases classified elsewhere: Secondary | ICD-10-CM | POA: Insufficient documentation

## 2011-10-07 DIAGNOSIS — R079 Chest pain, unspecified: Secondary | ICD-10-CM | POA: Diagnosis not present

## 2011-10-07 DIAGNOSIS — R4182 Altered mental status, unspecified: Secondary | ICD-10-CM | POA: Diagnosis not present

## 2011-10-07 LAB — DIFFERENTIAL
Basophils Absolute: 0 10*3/uL (ref 0.0–0.1)
Basophils Relative: 1 % (ref 0–1)
Eosinophils Absolute: 0.3 10*3/uL (ref 0.0–0.7)
Monocytes Absolute: 0.5 10*3/uL (ref 0.1–1.0)
Monocytes Relative: 9 % (ref 3–12)
Neutro Abs: 3.9 10*3/uL (ref 1.7–7.7)

## 2011-10-07 LAB — CBC
HCT: 38.2 % — ABNORMAL LOW (ref 39.0–52.0)
MCH: 29.2 pg (ref 26.0–34.0)
MCHC: 32.5 g/dL (ref 30.0–36.0)
RDW: 15.9 % — ABNORMAL HIGH (ref 11.5–15.5)

## 2011-10-07 LAB — POCT I-STAT, CHEM 8
BUN: 19 mg/dL (ref 6–23)
Calcium, Ion: 1.18 mmol/L (ref 1.12–1.32)
Creatinine, Ser: 1.2 mg/dL (ref 0.50–1.35)
Glucose, Bld: 136 mg/dL — ABNORMAL HIGH (ref 70–99)
Hemoglobin: 12.2 g/dL — ABNORMAL LOW (ref 13.0–17.0)
Sodium: 141 mEq/L (ref 135–145)
TCO2: 23 mmol/L (ref 0–100)

## 2011-10-07 MED ORDER — HYDROCOD POLST-CHLORPHEN POLST 10-8 MG/5ML PO LQCR
5.0000 mL | Freq: Once | ORAL | Status: AC
  Administered 2011-10-07: 5 mL via ORAL
  Filled 2011-10-07 (×2): qty 5

## 2011-10-07 MED ORDER — BENZONATATE 100 MG PO CAPS: 100.0000 mg | ORAL_CAPSULE | Freq: Three times a day (TID) | ORAL | Status: DC

## 2011-10-07 NOTE — ED Provider Notes (Cosign Needed)
History     CSN: [id number]  Arrival date & time 10/07/11  2048   First MD Initiated Contact with Patient 10/07/11 2106      Chief Complaint  Patient presents with  . Cough     HPI  History provided by patient and family. Patient is a 76 year old male with history of hypertension, hyperlipidemia, CVA who presents with complaints of productive cough for the past several days. Sputum is brownish mucousy color. Symptoms have been gradually worsening. They're described as moderate. Patient reports having associated fevers, chills and night sweats. He denies having any nasal congestion, rhinorrhea, sore throat, nausea or vomiting. Patient does report occasional pleuritic bilateral lower chest pains with cough. He denies pain at rest. Patient has no recent travel. He denies any known sick contacts.    Past Medical History  Diagnosis Date  . GERD 03/15/2007    erosive esophagitis  . HYPERTENSION 03/15/2007  . HYPERLIPIDEMIA 10/25/2007  . OSTEOARTHRITIS 03/15/2007    right knee  . CVA (cerebral infarction) 10/27/2010  . CEREBROVASCULAR ACCIDENT, HX OF 05/17/2007  . COLONIC POLYPS, HX OF 10/25/2007  . 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 BENIGN PROSTATIC HYPERTROPHY 08/12/2007  . PERIPHERAL VASCULAR DISEASE 10/16/2007    carotids < 70%  . TRANSIENT ISCHEMIC ATTACK 03/19/2008  . Nephrolithiasis   . Vitamin d deficiency     Past Surgical History  Procedure Date  . Lung biopsy 1980    s/p  . Nasal sinus surgery     s/p  . Lithotripsy     s/p  . Rotator cuff repair     x 2    Family History  Problem Relation Age of Onset  . Alzheimer's disease Father   . Heart disease Father   . Hypertension Other   . Heart disease Other     Grandparents    History  Substance Use Topics  . Smoking status: Former Games developer  . Smokeless tobacco: Not on file  . Alcohol Use: No      Review of Systems  Constitutional: Positive for fever, chills and diaphoresis.  HENT: Negative for  congestion, sore throat and rhinorrhea.   Respiratory: Positive for cough and shortness of breath.   Cardiovascular: Positive for chest pain. Negative for palpitations.  Gastrointestinal: Negative for nausea, vomiting, abdominal pain, diarrhea and constipation.  All other systems reviewed and are negative.    Allergies  Cefuroxime axetil; Hydrochlorothiazide; Iohexol; Losartan potassium; Lovastatin; Niacin; Pseudoeph-doxylamine-dm-apap; Ramipril; and Tramadol hcl  Home Medications   Current Outpatient Rx  Name Route Sig Dispense Refill  . ALBUTEROL SULFATE HFA 108 (90 BASE) MCG/ACT IN AERS Inhalation Inhale 2 puffs into the lungs 4 (four) times daily as needed. For wheezing    . ALPRAZOLAM 0.5 MG PO TABS Oral Take 0.5 mg by mouth 2 (two) times daily as needed. For anxiety    . ASPIRIN EC 81 MG PO TBEC Oral Take 81 mg by mouth daily.    Marland Kitchen VITAMIN D 1000 UNITS PO TABS Oral Take 1,000 Units by mouth daily.      Marland Kitchen CLONIDINE HCL 0.1 MG PO TABS Oral Take 1 tablet (0.1 mg total) by mouth 3 (three) times daily. 90 tablet 2  . DICLOFENAC SODIUM 1 % TD GEL Topical Apply 1 application topically 4 (four) times daily. 100 g 3  . DONEPEZIL HCL 10 MG PO TABS Oral Take 10 mg by mouth every morning.    Marland Kitchen FLUTICASONE PROPIONATE 50 MCG/ACT NA SUSP Nasal 2 sprays by  Nasal route daily.      Marland Kitchen HYDROCODONE-ACETAMINOPHEN 5-500 MG PO TABS Oral Take 1 tablet by mouth 4 (four) times daily as needed. For pain    . IPRATROPIUM BROMIDE 0.06 % NA SOLN Nasal Place 2 sprays into the nose 4 (four) times daily. 15 mL 12  . LOSARTAN POTASSIUM 100 MG PO TABS Oral Take 1 tablet (100 mg total) by mouth daily. 90 tablet 2  . OMEPRAZOLE 20 MG PO CPDR Oral Take 1 capsule (20 mg total) by mouth daily. 90 capsule 2  . TAMSULOSIN HCL 0.4 MG PO CAPS Oral Take 1 capsule (0.4 mg total) by mouth daily. 90 capsule 2  . VERAPAMIL HCL ER (CO) 180 MG PO TB24 Oral Take 180 mg by mouth at bedtime.    . WARFARIN SODIUM 2.5 MG PO TABS Oral  Take 2.5 mg by mouth See admin instructions. Take 5mg  on Monday, Wednesday, Friday, and Sunday.  On Tuesday, Thursday, and Saturday, take 2.5mg .      BP 152/56  Pulse 96  Temp(Src) 99 F (37.2 C) (Oral)  Resp 18  SpO2 97%  Physical Exam  Nursing note and vitals reviewed. Constitutional: He is oriented to person, place, and time. He appears well-developed and well-nourished. No distress.  HENT:  Head: Normocephalic and atraumatic.  Right Ear: External ear normal.  Left Ear: External ear normal.  Mouth/Throat: Oropharynx is clear and moist.  Neck: Normal range of motion. Neck supple.  Cardiovascular: Normal rate and regular rhythm.   Pulmonary/Chest: Effort normal and breath sounds normal. No respiratory distress. He has no wheezes. He has no rales. He exhibits no tenderness.       Slight rhonchi in right chest  Abdominal: Soft. He exhibits no distension. There is no tenderness. There is no rebound.  Lymphadenopathy:    He has no cervical adenopathy.  Neurological: He is alert and oriented to person, place, and time.  Psychiatric: He has a normal mood and affect. His behavior is normal.    ED Course  Procedures   Results for orders placed during the hospital encounter of 10/07/11  CBC      Component Value Range   WBC 5.6  4.0 - 10.5 (K/uL)   RBC 4.25  4.22 - 5.81 (MIL/uL)   Hemoglobin 12.4 (*) 13.0 - 17.0 (g/dL)   HCT 16.1 (*) 09.6 - 52.0 (%)   MCV 89.9  78.0 - 100.0 (fL)   MCH 29.2  26.0 - 34.0 (pg)   MCHC 32.5  30.0 - 36.0 (g/dL)   RDW 04.5 (*) 40.9 - 15.5 (%)   Platelets 124 (*) 150 - 400 (K/uL)  DIFFERENTIAL      Component Value Range   Neutrophils Relative 70  43 - 77 (%)   Neutro Abs 3.9  1.7 - 7.7 (K/uL)   Lymphocytes Relative 15  12 - 46 (%)   Lymphs Abs 0.8  0.7 - 4.0 (K/uL)   Monocytes Relative 9  3 - 12 (%)   Monocytes Absolute 0.5  0.1 - 1.0 (K/uL)   Eosinophils Relative 6 (*) 0 - 5 (%)   Eosinophils Absolute 0.3  0.0 - 0.7 (K/uL)   Basophils Relative 1   0 - 1 (%)   Basophils Absolute 0.0  0.0 - 0.1 (K/uL)  PROTIME-INR      Component Value Range   Prothrombin Time 31.8 (*) 11.6 - 15.2 (seconds)   INR 3.02 (*) 0.00 - 1.49   POCT I-STAT, CHEM 8  Component Value Range   Sodium 141  135 - 145 (mEq/L)   Potassium 4.2  3.5 - 5.1 (mEq/L)   Chloride 107  96 - 112 (mEq/L)   BUN 19  6 - 23 (mg/dL)   Creatinine, Ser 1.61  0.50 - 1.35 (mg/dL)   Glucose, Bld 096 (*) 70 - 99 (mg/dL)   Calcium, Ion 0.45  4.09 - 1.32 (mmol/L)   TCO2 23  0 - 100 (mmol/L)   Hemoglobin 12.2 (*) 13.0 - 17.0 (g/dL)   HCT 81.1 (*) 91.4 - 52.0 (%)      Dg Chest 2 View  10/07/2011  *RADIOLOGY REPORT*  Clinical Data: Cough for 2 days.  Chest pain and back pain  CHEST - 2 VIEW  Comparison: 03/17/2008  Findings: Normal heart size and pulmonary vascularity.  Scattered fibrosis in the lungs with suggestion of mild emphysematous changes.  No focal airspace consolidation.  No blunting of costophrenic angles.  No pneumothorax.  Degenerative changes in the thoracic spine.  Calcification of the aorta.  No significant change since previous study. Postoperative changes in the right shoulder.  IMPRESSION: No evidence of active pulmonary disease.  Original Report Authenticated By: Marlon Pel, M.D.     1. Cough   2. Viral infection       MDM  9:30 PM patient seen and evaluated. Patient in no acute distress. He should with normal respirations at this time and good O2 sats.  Medical screening examination/treatment/procedure(s) were performed by non-physician practitioner and as supervising physician I was immediately available for consultation/collaboration. Osvaldo Human, M.D.     Angus Seller, PA 10/07/11 2350  Carleene Cooper III, MD 10/08/11 [phone]

## 2011-10-07 NOTE — ED Notes (Signed)
Pt c/o chest pain w/ cough only. Pt states that cough is productive, brownish sputum

## 2011-10-07 NOTE — ED Notes (Signed)
Pt states that he has been sick since around Christmas. Pt has been seen at PCP and given three rounds of antibiotics and the cough is persistant. Pt states that he will get better for a few days and then his productive cough will come back and he has fevers and sweats at night. Pt alert and oriented denies pain at this time.

## 2011-10-08 ENCOUNTER — Encounter (HOSPITAL_COMMUNITY): Payer: Self-pay | Admitting: Emergency Medicine

## 2011-10-08 ENCOUNTER — Emergency Department (HOSPITAL_COMMUNITY): Payer: Medicare Other

## 2011-10-08 ENCOUNTER — Inpatient Hospital Stay (HOSPITAL_COMMUNITY)
Admission: EM | Admit: 2011-10-08 | Discharge: 2011-10-10 | DRG: 193 | Disposition: A | Discharge status: Home / Self Care | Payer: Medicare Other | Source: Ambulatory Visit | Attending: Internal Medicine | Admitting: Internal Medicine

## 2011-10-08 DIAGNOSIS — R413 Other amnesia: Secondary | ICD-10-CM

## 2011-10-08 DIAGNOSIS — Z860100 Personal history of colon polyps, unspecified: Secondary | ICD-10-CM

## 2011-10-08 DIAGNOSIS — Z7901 Long term (current) use of anticoagulants: Secondary | ICD-10-CM | POA: Diagnosis not present

## 2011-10-08 DIAGNOSIS — R531 Weakness: Secondary | ICD-10-CM

## 2011-10-08 DIAGNOSIS — R0602 Shortness of breath: Secondary | ICD-10-CM

## 2011-10-08 DIAGNOSIS — R197 Diarrhea, unspecified: Secondary | ICD-10-CM | POA: Diagnosis not present

## 2011-10-08 DIAGNOSIS — M1710 Unilateral primary osteoarthritis, unspecified knee: Secondary | ICD-10-CM | POA: Diagnosis present

## 2011-10-08 DIAGNOSIS — D6859 Other primary thrombophilia: Secondary | ICD-10-CM

## 2011-10-08 DIAGNOSIS — IMO0001 Reserved for inherently not codable concepts without codable children: Secondary | ICD-10-CM

## 2011-10-08 DIAGNOSIS — R259 Unspecified abnormal involuntary movements: Secondary | ICD-10-CM

## 2011-10-08 DIAGNOSIS — R918 Other nonspecific abnormal finding of lung field: Secondary | ICD-10-CM | POA: Diagnosis not present

## 2011-10-08 DIAGNOSIS — I639 Cerebral infarction, unspecified: Secondary | ICD-10-CM

## 2011-10-08 DIAGNOSIS — R059 Cough, unspecified: Secondary | ICD-10-CM | POA: Diagnosis not present

## 2011-10-08 DIAGNOSIS — L570 Actinic keratosis: Secondary | ICD-10-CM

## 2011-10-08 DIAGNOSIS — Z87891 Personal history of nicotine dependence: Secondary | ICD-10-CM

## 2011-10-08 DIAGNOSIS — G459 Transient cerebral ischemic attack, unspecified: Secondary | ICD-10-CM

## 2011-10-08 DIAGNOSIS — G9349 Other encephalopathy: Secondary | ICD-10-CM | POA: Diagnosis present

## 2011-10-08 DIAGNOSIS — M79609 Pain in unspecified limb: Secondary | ICD-10-CM

## 2011-10-08 DIAGNOSIS — E785 Hyperlipidemia, unspecified: Secondary | ICD-10-CM

## 2011-10-08 DIAGNOSIS — J189 Pneumonia, unspecified organism: Secondary | ICD-10-CM | POA: Diagnosis not present

## 2011-10-08 DIAGNOSIS — J309 Allergic rhinitis, unspecified: Secondary | ICD-10-CM

## 2011-10-08 DIAGNOSIS — F05 Delirium due to known physiological condition: Secondary | ICD-10-CM | POA: Diagnosis not present

## 2011-10-08 DIAGNOSIS — F0390 Unspecified dementia without behavioral disturbance: Secondary | ICD-10-CM | POA: Diagnosis present

## 2011-10-08 DIAGNOSIS — R509 Fever, unspecified: Secondary | ICD-10-CM

## 2011-10-08 DIAGNOSIS — R05 Cough: Secondary | ICD-10-CM

## 2011-10-08 DIAGNOSIS — D61818 Other pancytopenia: Secondary | ICD-10-CM | POA: Diagnosis not present

## 2011-10-08 DIAGNOSIS — M1990 Unspecified osteoarthritis, unspecified site: Secondary | ICD-10-CM

## 2011-10-08 DIAGNOSIS — Z7982 Long term (current) use of aspirin: Secondary | ICD-10-CM | POA: Diagnosis not present

## 2011-10-08 DIAGNOSIS — K5900 Constipation, unspecified: Secondary | ICD-10-CM

## 2011-10-08 DIAGNOSIS — N529 Male erectile dysfunction, unspecified: Secondary | ICD-10-CM

## 2011-10-08 DIAGNOSIS — I10 Essential (primary) hypertension: Secondary | ICD-10-CM | POA: Diagnosis present

## 2011-10-08 DIAGNOSIS — M545 Low back pain, unspecified: Secondary | ICD-10-CM

## 2011-10-08 DIAGNOSIS — J329 Chronic sinusitis, unspecified: Secondary | ICD-10-CM

## 2011-10-08 DIAGNOSIS — R51 Headache: Secondary | ICD-10-CM

## 2011-10-08 DIAGNOSIS — I739 Peripheral vascular disease, unspecified: Secondary | ICD-10-CM

## 2011-10-08 DIAGNOSIS — N400 Enlarged prostate without lower urinary tract symptoms: Secondary | ICD-10-CM | POA: Diagnosis present

## 2011-10-08 DIAGNOSIS — J45909 Unspecified asthma, uncomplicated: Secondary | ICD-10-CM

## 2011-10-08 DIAGNOSIS — K219 Gastro-esophageal reflux disease without esophagitis: Secondary | ICD-10-CM | POA: Diagnosis present

## 2011-10-08 DIAGNOSIS — Z8673 Personal history of transient ischemic attack (TIA), and cerebral infarction without residual deficits: Secondary | ICD-10-CM | POA: Diagnosis not present

## 2011-10-08 DIAGNOSIS — R079 Chest pain, unspecified: Secondary | ICD-10-CM

## 2011-10-08 DIAGNOSIS — R209 Unspecified disturbances of skin sensation: Secondary | ICD-10-CM

## 2011-10-08 DIAGNOSIS — Z79899 Other long term (current) drug therapy: Secondary | ICD-10-CM

## 2011-10-08 DIAGNOSIS — R4182 Altered mental status, unspecified: Secondary | ICD-10-CM | POA: Diagnosis not present

## 2011-10-08 DIAGNOSIS — K921 Melena: Secondary | ICD-10-CM

## 2011-10-08 DIAGNOSIS — R232 Flushing: Secondary | ICD-10-CM

## 2011-10-08 DIAGNOSIS — K149 Disease of tongue, unspecified: Secondary | ICD-10-CM

## 2011-10-08 DIAGNOSIS — M791 Myalgia, unspecified site: Secondary | ICD-10-CM

## 2011-10-08 DIAGNOSIS — Z6825 Body mass index (BMI) 25.0-25.9, adult: Secondary | ICD-10-CM | POA: Diagnosis not present

## 2011-10-08 DIAGNOSIS — Z888 Allergy status to other drugs, medicaments and biological substances status: Secondary | ICD-10-CM

## 2011-10-08 DIAGNOSIS — J159 Unspecified bacterial pneumonia: Secondary | ICD-10-CM | POA: Diagnosis not present

## 2011-10-08 DIAGNOSIS — M722 Plantar fascial fibromatosis: Secondary | ICD-10-CM

## 2011-10-08 LAB — HEPATIC FUNCTION PANEL
AST: 25 U/L (ref 0–37)
Albumin: 3.6 g/dL (ref 3.5–5.2)

## 2011-10-08 LAB — PROTIME-INR
INR: 2.63 — ABNORMAL HIGH (ref 0.00–1.49)
Prothrombin Time: 28.5 seconds — ABNORMAL HIGH (ref 11.6–15.2)

## 2011-10-08 LAB — URINALYSIS, ROUTINE W REFLEX MICROSCOPIC
Bilirubin Urine: NEGATIVE
Hgb urine dipstick: NEGATIVE
Nitrite: NEGATIVE
Protein, ur: NEGATIVE mg/dL
Specific Gravity, Urine: 1.018 (ref 1.005–1.030)
Urobilinogen, UA: 1 mg/dL (ref 0.0–1.0)

## 2011-10-08 LAB — CBC
MCH: 28.1 pg (ref 26.0–34.0)
MCHC: 31.2 g/dL (ref 30.0–36.0)
MCV: 90.1 fL (ref 78.0–100.0)
Platelets: 104 10*3/uL — ABNORMAL LOW (ref 150–400)
RBC: 4.16 MIL/uL — ABNORMAL LOW (ref 4.22–5.81)
RDW: 15.7 % — ABNORMAL HIGH (ref 11.5–15.5)

## 2011-10-08 LAB — DIFFERENTIAL
Basophils Absolute: 0 10*3/uL (ref 0.0–0.1)
Basophils Relative: 0 % (ref 0–1)
Eosinophils Absolute: 0.1 10*3/uL (ref 0.0–0.7)
Eosinophils Relative: 3 % (ref 0–5)
Neutrophils Relative %: 77 % (ref 43–77)

## 2011-10-08 LAB — BASIC METABOLIC PANEL
Calcium: 9.3 mg/dL (ref 8.4–10.5)
GFR calc Af Amer: 71 mL/min — ABNORMAL LOW (ref >90)
GFR calc non Af Amer: 61 mL/min — ABNORMAL LOW (ref >90)
Glucose, Bld: 107 mg/dL — ABNORMAL HIGH (ref 70–99)
Potassium: 3.9 mEq/L (ref 3.5–5.1)
Sodium: 134 mEq/L — ABNORMAL LOW (ref 135–145)

## 2011-10-08 LAB — ETHANOL: Alcohol, Ethyl (B): <11 mg/dL (ref 0–11)

## 2011-10-08 LAB — RAPID URINE DRUG SCREEN, HOSP PERFORMED
Amphetamines: NOT DETECTED
Barbiturates: NOT DETECTED
Benzodiazepines: NOT DETECTED

## 2011-10-08 LAB — LIPASE, BLOOD: Lipase: 26 U/L (ref 11–59)

## 2011-10-08 MED ORDER — WARFARIN SODIUM 2.5 MG PO TABS: 2.5000 mg | ORAL_TABLET | ORAL | Status: DC

## 2011-10-08 MED ORDER — ALBUTEROL SULFATE (5 MG/ML) 0.5% IN NEBU
2.5000 mg | INHALATION_SOLUTION | Freq: Four times a day (QID) | RESPIRATORY_TRACT | Status: DC
  Administered 2011-10-08: 2.5 mg via RESPIRATORY_TRACT
  Filled 2011-10-08: qty 0.5

## 2011-10-08 MED ORDER — SODIUM CHLORIDE 0.9 % IV SOLN: INTRAVENOUS | Status: DC

## 2011-10-08 MED ORDER — DONEPEZIL HCL 10 MG PO TABS
10.0000 mg | ORAL_TABLET | Freq: Every day | ORAL | Status: DC
  Administered 2011-10-09 – 2011-10-10 (×2): 10 mg via ORAL
  Filled 2011-10-08 (×2): qty 1

## 2011-10-08 MED ORDER — ACETAMINOPHEN 650 MG RE SUPP: 650.0000 mg | Freq: Four times a day (QID) | RECTAL | Status: DC | PRN

## 2011-10-08 MED ORDER — PANTOPRAZOLE SODIUM 40 MG PO TBEC
40.0000 mg | DELAYED_RELEASE_TABLET | Freq: Every day | ORAL | Status: DC
  Administered 2011-10-09 – 2011-10-10 (×2): 40 mg via ORAL
  Filled 2011-10-08 (×2): qty 1

## 2011-10-08 MED ORDER — LOSARTAN POTASSIUM 50 MG PO TABS
100.0000 mg | ORAL_TABLET | Freq: Every day | ORAL | Status: DC
  Administered 2011-10-08 – 2011-10-09 (×2): 100 mg via ORAL
  Filled 2011-10-08 (×4): qty 2

## 2011-10-08 MED ORDER — TAMSULOSIN HCL 0.4 MG PO CAPS
0.4000 mg | ORAL_CAPSULE | Freq: Every day | ORAL | Status: DC
  Administered 2011-10-09 – 2011-10-10 (×2): 0.4 mg via ORAL
  Filled 2011-10-08 (×2): qty 1

## 2011-10-08 MED ORDER — MOXIFLOXACIN HCL IN NACL 400 MG/250ML IV SOLN
400.0000 mg | INTRAVENOUS | Status: DC
  Administered 2011-10-09: 400 mg via INTRAVENOUS
  Filled 2011-10-08 (×2): qty 250

## 2011-10-08 MED ORDER — WARFARIN SODIUM 5 MG PO TABS
5.0000 mg | ORAL_TABLET | ORAL | Status: DC
  Filled 2011-10-08: qty 1

## 2011-10-08 MED ORDER — ACETAMINOPHEN 325 MG PO TABS: 650.0000 mg | ORAL_TABLET | Freq: Four times a day (QID) | ORAL | Status: DC | PRN

## 2011-10-08 MED ORDER — CLONIDINE HCL 0.1 MG PO TABS
0.1000 mg | ORAL_TABLET | Freq: Three times a day (TID) | ORAL | Status: DC
  Administered 2011-10-08 – 2011-10-10 (×5): 0.1 mg via ORAL
  Filled 2011-10-08 (×8): qty 1

## 2011-10-08 MED ORDER — VERAPAMIL HCL 180 MG (CO) PO TB24: 180.0000 mg | ORAL_TABLET | Freq: Every day | ORAL | Status: DC

## 2011-10-08 MED ORDER — SODIUM CHLORIDE 0.9 % IV BOLUS (SEPSIS)
500.0000 mL | Freq: Once | INTRAVENOUS | Status: AC
  Administered 2011-10-08: 500 mL via INTRAVENOUS

## 2011-10-08 MED ORDER — IPRATROPIUM BROMIDE 0.06 % NA SOLN
2.0000 | Freq: Four times a day (QID) | NASAL | Status: DC
  Administered 2011-10-08 – 2011-10-10 (×4): 2 via NASAL
  Filled 2011-10-08: qty 15

## 2011-10-08 MED ORDER — FLUTICASONE PROPIONATE 50 MCG/ACT NA SUSP
2.0000 | Freq: Every day | NASAL | Status: DC
  Administered 2011-10-08 – 2011-10-10 (×3): 2 via NASAL
  Filled 2011-10-08 (×2): qty 16

## 2011-10-08 MED ORDER — ALBUTEROL SULFATE (5 MG/ML) 0.5% IN NEBU: 2.5000 mg | INHALATION_SOLUTION | RESPIRATORY_TRACT | Status: DC | PRN

## 2011-10-08 MED ORDER — ONDANSETRON HCL 4 MG PO TABS
4.0000 mg | ORAL_TABLET | Freq: Four times a day (QID) | ORAL | Status: DC | PRN
  Filled 2011-10-08: qty 1

## 2011-10-08 MED ORDER — SODIUM CHLORIDE 0.9 % IV SOLN
INTRAVENOUS | Status: AC
  Administered 2011-10-08: 19:00:00 via INTRAVENOUS

## 2011-10-08 MED ORDER — ONDANSETRON HCL 4 MG/2ML IJ SOLN: 4.0000 mg | Freq: Four times a day (QID) | INTRAMUSCULAR | Status: DC | PRN

## 2011-10-08 MED ORDER — ACETAMINOPHEN 650 MG RE SUPP
975.0000 mg | Freq: Once | RECTAL | Status: AC
  Administered 2011-10-08: 975 mg via RECTAL
  Filled 2011-10-08: qty 1

## 2011-10-08 MED ORDER — VITAMIN D3 25 MCG (1000 UNIT) PO TABS
1000.0000 [IU] | ORAL_TABLET | Freq: Every day | ORAL | Status: DC
  Administered 2011-10-09 – 2011-10-10 (×2): 1000 [IU] via ORAL
  Filled 2011-10-08 (×2): qty 1

## 2011-10-08 MED ORDER — WARFARIN SODIUM 2.5 MG PO TABS
2.5000 mg | ORAL_TABLET | ORAL | Status: DC
  Administered 2011-10-09: 2.5 mg via ORAL
  Filled 2011-10-08: qty 1

## 2011-10-08 MED ORDER — SODIUM CHLORIDE 0.9 % IV SOLN
INTRAVENOUS | Status: DC
  Administered 2011-10-09: 06:00:00 via INTRAVENOUS

## 2011-10-08 MED ORDER — GUAIFENESIN-DM 100-10 MG/5ML PO SYRP
5.0000 mL | ORAL_SOLUTION | ORAL | Status: DC | PRN
  Filled 2011-10-08: qty 5

## 2011-10-08 MED ORDER — WARFARIN SODIUM 5 MG PO TABS
5.0000 mg | ORAL_TABLET | ORAL | Status: DC
  Administered 2011-10-08: 5 mg via ORAL
  Filled 2011-10-08 (×2): qty 1

## 2011-10-08 MED ORDER — ASPIRIN EC 81 MG PO TBEC
81.0000 mg | DELAYED_RELEASE_TABLET | Freq: Every day | ORAL | Status: DC
  Administered 2011-10-08: 81 mg via ORAL
  Filled 2011-10-08 (×4): qty 1

## 2011-10-08 MED ORDER — ALUM & MAG HYDROXIDE-SIMETH 200-200-20 MG/5ML PO SUSP: 30.0000 mL | Freq: Four times a day (QID) | ORAL | Status: DC | PRN

## 2011-10-08 MED ORDER — SENNA 8.6 MG PO TABS
1.0000 | ORAL_TABLET | Freq: Two times a day (BID) | ORAL | Status: DC
  Administered 2011-10-08 – 2011-10-10 (×4): 8.6 mg via ORAL
  Filled 2011-10-08 (×6): qty 1

## 2011-10-08 MED ORDER — VERAPAMIL HCL ER 180 MG PO TBCR
180.0000 mg | EXTENDED_RELEASE_TABLET | Freq: Every day | ORAL | Status: DC
  Administered 2011-10-08 – 2011-10-09 (×2): 180 mg via ORAL
  Filled 2011-10-08 (×4): qty 1

## 2011-10-08 MED ORDER — ALPRAZOLAM 0.5 MG PO TABS: 0.5000 mg | ORAL_TABLET | Freq: Two times a day (BID) | ORAL | Status: DC | PRN

## 2011-10-08 MED ORDER — MOXIFLOXACIN HCL IN NACL 400 MG/250ML IV SOLN
400.0000 mg | Freq: Once | INTRAVENOUS | Status: AC
  Administered 2011-10-08: 400 mg via INTRAVENOUS
  Filled 2011-10-08: qty 250

## 2011-10-08 NOTE — ED Notes (Signed)
Wife reports that pt was seen in ED last night for cough.  States he has been confused since waking up at 9am this morning.  Reports that he is "picking" at things and urinating in the floor.  Pt states he doesn't know why he is here.  Wife also reports that pt was coughing during the night and spitting in the floor. LSN at 22:45 when he went to bed last night.  Reports fever at home.

## 2011-10-08 NOTE — ED Notes (Signed)
Paged triad 

## 2011-10-08 NOTE — Progress Notes (Signed)
PHARMACY - ANTICOAGULATION CONSULT INITIAL NOTE  Pharmacy Consult for: Coumadin  Indication: H/o CVA    Patient Data:   Allergies: Allergies  Allergen Reactions  . Cefuroxime Axetil Other (See Comments)    bad taste  . Hydrochlorothiazide Other (See Comments)    unknown  . Iohexol Hives       . Losartan Potassium Other (See Comments)    unknown  . Lovastatin Other (See Comments)    cramps  . Niacin Other (See Comments)    burning  . Pseudoeph-Doxylamine-Dm-Apap Other (See Comments)    tremor  . Ramipril Cough  . Tramadol Hcl Other (See Comments)    hallucinating    Patient Measurements:     Vital Signs: Temp:  [97.7 F (36.5 C)-102.1 F (38.9 C)] 97.7 F (36.5 C) (02/10 2100) Pulse Rate:  [82-97] 82  (02/10 2100) Resp:  [18-20] 18  (02/10 2100) BP: (120-150)/(Douglas-74) 150/74 mmHg (02/10 2100) SpO2:  [95 %-99 %] 98 % (02/10 2127)  Intake/Output from previous day:  Intake/Output Summary (Last 24 hours) at 10/08/11 2320 Last data filed at 10/08/11 2055  Gross per 24 hour  Intake      0 ml  Output    225 ml  Net   -225 ml    Labs:  Basename 10/08/11 1634 10/07/11 2200 10/07/11 2148  HGB 11.7* 12.2* --  HCT 37.5* 36.0* 38.2*  PLT 104* -- 124*  APTT 64* -- --  LABPROT 28.5* -- 31.8*  INR 2.63* -- 3.02*  HEPARINUNFRC -- -- --  CREATININE 1.15 1.20 --  CKTOTAL -- -- --  CKMB -- -- --  TROPONINI -- -- --   The CrCl is unknown because both a height and weight (above a minimum accepted value) are required for this calculation.  Medical History: Past Medical History  Diagnosis Date  . GERD 03/15/2007    erosive esophagitis  . HYPERTENSION 03/15/2007  . HYPERLIPIDEMIA 10/25/2007  . OSTEOARTHRITIS 03/15/2007    right knee  . CVA (cerebral infarction) 10/27/2010  . CEREBROVASCULAR ACCIDENT, HX OF 05/17/2007  . COLONIC POLYPS, HX OF 10/25/2007  . 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 BENIGN PROSTATIC HYPERTROPHY 08/12/2007  . PERIPHERAL VASCULAR  DISEASE 10/16/2007    carotids < 70%  . TRANSIENT ISCHEMIC ATTACK 03/19/2008  . Nephrolithiasis   . Vitamin d deficiency     Scheduled medications:     . sodium chloride   Intravenous STAT  . acetaminophen  975 mg Rectal Once  . aspirin EC  81 mg Oral Daily  . cholecalciferol  1,000 Units Oral Daily  . cloNIDine  0.1 mg Oral TID  . donepezil  10 mg Oral Daily  . fluticasone  2 spray Each Nare Daily  . ipratropium  2 spray Nasal QID  . losartan  100 mg Oral Daily  . moxifloxacin  400 mg Intravenous Once  . moxifloxacin  400 mg Intravenous Q24H  . pantoprazole  40 mg Oral Q1200  . senna  1 tablet Oral BID  . sodium chloride  500 mL Intravenous Once  . Tamsulosin HCl  0.4 mg Oral Daily  . verapamil  180 mg Oral QHS  . warfarin  2.5 mg Oral Custom  . warfarin  5 mg Oral Custom  . DISCONTD: albuterol  2.5 mg Nebulization Q6H  . DISCONTD: verapamil  180 mg Oral QHS  . DISCONTD: warfarin  2.5 mg Oral See admin instructions  . DISCONTD: warfarin  2.5 mg Oral Custom  . DISCONTD: warfarin  2.5 mg Oral Custom  . DISCONTD: warfarin  5 mg Oral Custom  . DISCONTD: warfarin  5 mg Oral Custom     Assessment:  76 y.o. Douglas [name] admitted on 10/08/2011, with h/o CVA on chronic Coumadin therapy. Pharmacy consulted to manage Coumadin. INR (2.63) is at-goal. Home Coumadin regimen of 5mg  daily, except 2.5mg  Monday, Wednesday, Friday.   Goal of Therapy:  1. INR 2-3  Plan:  1. Continue home Coumadin regimen.  2.   Daily PT / INR.   Dineen Kid Thad Ranger, PharmD 10/08/2011, 11:20 PM

## 2011-10-08 NOTE — H&P (Signed)
PATIENT DETAILS Name: Douglas [name] Age: 76 y.o. Sex: male Date of Birth: 20-Jun-1936 Admit Date: 10/08/2011 ZOX:WRUE Plotnikov, MD, MD   CHIEF COMPLAINT:  Cough and fever for the past 3 days  HPI: Patient is a 76 year old white male with a past medical history of mild dementia, hypertension, CVA, prior history of a hypercoagulable state on chronic Coumadin therapy comes to the ED with the above-noted complaints. Most of this history is actually obtained from the patient's wife was at bedside, apparently for the past 3 days patient has had cough with productive white phlegm. For the past 2 days the patient has noted to have a fever as high as 102F. He was brought to the emergency room yesterday, thought to have a viral syndrome and discharged. Upon going home he developed worsening altered mental status, this morning when he woke up he was putting things out of the air and actually urinated in the bedroom. Patient's family then called the patient's primary care practitioner and was advised to go to the emergency room again, a x-ray done today shows bilateral infiltrates and the patient is now being admitted to the hospitalist service for further evaluation and treatment. Apparently this patient has been having cough and myalgias since middle of December, and per the patient's wife he has had a course of tapering steroids and 2 different antibiotics. She cannot recall  the name of the antibiotics, however looking through the office records look likes the patient  was on Augmentin. Currently the patient is denies any chest pain or shortness of breath. He denies any headache. He denies any nausea vomiting or diarrhea. He claims to have mild epigastric discomfort at times. Although obviously confused he is able to answer most of my questions and follow all my commands.   ALLERGIES:   Allergies  Allergen Reactions  . Cefuroxime Axetil Other (See Comments)    bad taste  . Hydrochlorothiazide  Other (See Comments)    unknown  . Iohexol Hives       . Losartan Potassium Other (See Comments)    unknown  . Lovastatin Other (See Comments)    cramps  . Niacin Other (See Comments)    burning  . Pseudoeph-Doxylamine-Dm-Apap Other (See Comments)    tremor  . Ramipril Cough  . Tramadol Hcl Other (See Comments)    hallucinating    PAST MEDICAL HISTORY: Past Medical History  Diagnosis Date  . GERD 03/15/2007    erosive esophagitis  . HYPERTENSION 03/15/2007  . HYPERLIPIDEMIA 10/25/2007  . OSTEOARTHRITIS 03/15/2007    right knee  . CVA (cerebral infarction) 10/27/2010  . CEREBROVASCULAR ACCIDENT, HX OF 05/17/2007  . COLONIC POLYPS, HX OF 10/25/2007  . 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 BENIGN PROSTATIC HYPERTROPHY 08/12/2007  . PERIPHERAL VASCULAR DISEASE 10/16/2007    carotids < 70%  . TRANSIENT ISCHEMIC ATTACK 03/19/2008  . Nephrolithiasis   . Vitamin d deficiency     PAST SURGICAL HISTORY: Past Surgical History  Procedure Date  . Lung biopsy 1980    s/p  . Nasal sinus surgery     s/p  . Lithotripsy     s/p  . Rotator cuff repair     x 2    MEDICATIONS AT HOME: Prior to Admission medications   Medication Sig Start Date End Date Taking? Authorizing Provider  albuterol (PROAIR HFA) 108 (90 BASE) MCG/ACT inhaler Inhale 2 puffs into the lungs 4 (four) times daily as needed. For wheezing   Yes Historical Provider, MD  ALPRAZolam (XANAX) 0.5 MG tablet Take 0.5 mg by mouth 2 (two) times daily as needed. For anxiety   Yes Historical Provider, MD  aspirin EC 81 MG tablet Take 81 mg by mouth daily.   Yes Historical Provider, MD  cholecalciferol (VITAMIN D) 1000 UNITS tablet Take 1,000 Units by mouth daily.     Yes Historical Provider, MD  cloNIDine (CATAPRES) 0.1 MG tablet Take 1 tablet (0.1 mg total) by mouth 3 (three) times daily. 08/03/11  Yes Sonda Primes, MD  diclofenac sodium (VOLTAREN) 1 % GEL Apply 1 application topically 4 (four) times daily. 10/02/11  Yes  Alex Plotnikov, MD  donepezil (ARICEPT) 10 MG tablet Take 10 mg by mouth every morning. 04/27/11  Yes Sonda Primes, MD  fluticasone (FLONASE) 50 MCG/ACT nasal spray 2 sprays by Nasal route daily.     Yes Historical Provider, MD  HYDROcodone-acetaminophen (VICODIN) 5-500 MG per tablet Take 1 tablet by mouth 4 (four) times daily as needed. For pain 09/01/11  Yes Sonda Primes, MD  ipratropium (ATROVENT) 0.06 % nasal spray Place 2 sprays into the nose 4 (four) times daily. 04/27/11 04/26/12 Yes Alex Plotnikov, MD  losartan (COZAAR) 100 MG tablet Take 100 mg by mouth at bedtime. 07/31/11  Yes Alex Plotnikov, MD  omeprazole (PRILOSEC) 20 MG capsule Take 1 capsule (20 mg total) by mouth daily. 08/31/11  Yes Alex Plotnikov, MD  Tamsulosin HCl (FLOMAX) 0.4 MG CAPS Take 1 capsule (0.4 mg total) by mouth daily. 09/18/11  Yes Alex Plotnikov, MD  verapamil (COVERA HS) 180 MG (CO) 24 hr tablet Take 180 mg by mouth at bedtime.   Yes Historical Provider, MD  warfarin (COUMADIN) 2.5 MG tablet Take 2.5 mg by mouth See admin instructions. Take 5mg  on Monday, Wednesday, Friday, and Sunday.  On Tuesday, Thursday, and Saturday, take 2.5mg .   Yes Historical Provider, MD    FAMILY HISTORY: Family History  Problem Relation Age of Onset  . Alzheimer's disease Father   . Heart disease Father   . Hypertension Other   . Heart disease Other     Grandparents    SOCIAL HISTORY:  reports that he has quit smoking. He does not have any smokeless tobacco history on file. He reports that he does not drink alcohol or use illicit drugs.  REVIEW OF SYSTEMS:  Constitutional:   No  weight loss, night sweats,  Fevers, chills, fatigue.  HEENT:    No headaches, Difficulty swallowing,Tooth/dental problems,Sore throat,  No sneezing, itching, ear ache, nasal congestion, post nasal drip,   Cardio-vascular: No chest pain,  Orthopnea, PND, swelling in lower extremities, anasarca, dizziness, palpitations  GI:  No heartburn,  indigestion, abdominal pain, nausea, vomiting, diarrhea, change in bowel habits, loss of appetite  Resp: No shortness of breath with exertion or at rest.  N  No coughing up of blood.No change in color of mucus.No wheezing.No chest wall deformity  Skin:  no rash or lesions.  GU:  no dysuria, change in color of urine, no urgency or frequency.  No flank pain.  Musculoskeletal: No joint pain or swelling.  No decreased range of motion.  No back pain.  Psych: No change in mood or affect. No depression or anxiety.  No memory loss.   PHYSICAL EXAM: Blood pressure 120/53, pulse 90, temperature 102.1 F (38.9 C), temperature source Rectal, resp. rate 18, SpO2 99.00%.  General appearance :Awake, alert, not in any distress. Speech Clear. Not toxic Looking HEENT: Atraumatic and Normocephalic, pupils equally reactive to light and  accomodation Neck: supple, no JVD. No cervical lymphadenopathy.  Chest:Good air entry bilaterally, no added sounds  CVS: S1 S2 regular, no murmurs.  Abdomen: Bowel sounds present, Non tender and not distended with no gaurding, rigidity or rebound. Extremities: B/L Lower Ext shows no edema, both legs are warm to touch, with  dorsalis pedis pulses palpable. Neurology: Awake alert, and oriented X 3, CN II-XII intact, Non focal, Deep Tendon  Skin:No Rash Wounds:N/A  LABS ON ADMISSION:   Basename 10/08/11 1634 10/07/11 2200  NA 134* 141  K 3.9 4.2  CL 100 107  CO2 25 --  GLUCOSE 107* 136*  BUN 17 19  CREATININE 1.15 1.20  CALCIUM 9.3 --  MG -- --  PHOS -- --    Basename 10/08/11 1634  AST 25  ALT 14  ALKPHOS 66  BILITOT 0.8  PROT 7.0  ALBUMIN 3.6    Basename 10/08/11 1634  LIPASE 26  AMYLASE --    Basename 10/08/11 1634 10/07/11 2200 10/07/11 2148  WBC 5.3 -- 5.6  NEUTROABS 4.0 -- 3.9  HGB 11.7* 12.2* --  HCT 37.5* 36.0* --  MCV 90.1 -- 89.9  PLT 104* -- 124*   No results found for this basename: CKTOTAL:3,CKMB:3,CKMBINDEX:3,TROPONINI:3 in  the last 72 hours No results found for this basename: DDIMER:2 in the last 72 hours No components found with this basename: POCBNP:3   RADIOLOGIC STUDIES ON ADMISSION: Dg Chest 2 View  10/08/2011  *RADIOLOGY REPORT*  Clinical Data: Fever and cough.  CHEST - 2 VIEW  Comparison: Chest x-ray 10/17/2011.  Findings: The cardiac silhouette, mediastinal and hilar contours are within normal limits and stable.  There are patchy bibasilar infiltrates.  No effusions or edema.  The bony thorax is intact.  IMPRESSION: Patchy bibasilar infiltrates.  Original Report Authenticated By: P. Loralie Champagne, M.D.   Dg Chest 2 View  10/07/2011  *RADIOLOGY REPORT*  Clinical Data: Cough for 2 days.  Chest pain and back pain  CHEST - 2 VIEW  Comparison: 03/17/2008  Findings: Normal heart size and pulmonary vascularity.  Scattered fibrosis in the lungs with suggestion of mild emphysematous changes.  No focal airspace consolidation.  No blunting of costophrenic angles.  No pneumothorax.  Degenerative changes in the thoracic spine.  Calcification of the aorta.  No significant change since previous study. Postoperative changes in the right shoulder.  IMPRESSION: No evidence of active pulmonary disease.  Original Report Authenticated By: Marlon Pel, M.D.   Ct Head Wo Contrast  10/08/2011  *RADIOLOGY REPORT*  Clinical Data: Altered mental status, fever, cough  CT HEAD WITHOUT CONTRAST  Technique:  Contiguous axial images were obtained from the base of the skull through the vertex without contrast.  Comparison: 11/16/10  Findings: No skull fracture is noted.  There is mucosal thickening with opacification left maxillary sinus.  Mild mucosal thickening left ethmoid air cells.  The mastoid air cells are unremarkable.  No intracranial hemorrhage, mass effect or midline shift.  Stable cerebral atrophy.  Stable periventricular chronic white matter disease.  Old infarct in the left occipital lobe is stable from prior exam.  No  definite acute cortical infarction.  No mass lesion is noted on this unenhanced scan.  IMPRESSION:  1.  No acute intracranial abnormality.  Stable atrophy and chronic white matter disease.  Stable old left occipital infarct. 2.  Sinuses disease in the left ethmoid air cells and left maxillary sinus.  Original Report Authenticated By: Natasha Mead, M.D.    ASSESSMENT AND  PLAN: Present on Admission:  .PNA (pneumonia) -Likely community-acquired  -Start on empiric Avelox  -Since patient was febrile in the emergency room, ED physician has already ordered cultures  .Altered mental state -Likely secondary to toxic metabolite encephalopathy from underlying pneumonia -CT of the head done in the emergency room is negative -Anticipate patient recovering back to usual baseline as pneumonia gets better   .HYPERTENSION -Resume Cozaar and clonidine   .Dementia -Continue usual medications   .BPH (benign prostatic hyperplasia) -Continue with Flomax  Has a history of CVA secondary to a hypercoagulable state and hence on chronic Coumadin therapy  Further plan will depend as patient's clinical course evolves and further radiologic and laboratory data become available. Patient will be monitored closely.   DVT Prophylaxis: Not needed as patient on chronic Coumadin  Code Status:  full code  Total time spent for admission equals 45 minutes.  Jeoffrey Massed 10/08/2011, 7:33 PM

## 2011-10-08 NOTE — ED Provider Notes (Signed)
History     CSN: [id number]  Arrival date & time 10/08/11  1546   First MD Initiated Contact with Patient 10/08/11 1614      Chief Complaint  Patient presents with  . Altered Mental Status    (Consider location/radiation/quality/duration/timing/severity/associated sxs/prior treatment) HPI  Patient is brought to emergency department by his wife with concern of acute onset of altered mental status that she first noticed last night stating that patient has had a productive cough for the last month has been followed by his primary care physician but was also assessed in the emergency department last night with a normal chest x-ray but states that in the middle of the night he woke and began to spit sputum out onto the floor, got out of bed and urinated on the floor, and then woke this morning acting "delusional." Wife states that at breakfast he was "picking at things that weren't there and talking nonsense." She states that she could understand everything that the husband was saying but that he would go off on tangents not caring on a normal conversation. She states that he was normal when they went to sleep last night. Wife states that the patient has been evaluated by Dr. love, neurology, for question of mild dementia and is currently on Aricept but states that the aforementioned behavior from last night and this morning is very much not his normal. She states that he has occasional word finding difficulty but that at baseline is able to carry on normal conversation, ambulate about the house without difficulty, and carry on activities of daily living. She notes that this morning he was "stumbling about the house." Patient has history of CVA versus TIA and is on daily Coumadin because of a "clotting disorder." The wife states that other than the cough he's had no other unusual complaints. Level 5 caveat applies due to patient's altered mental status.  Past Medical History  Diagnosis Date  . GERD  03/15/2007    erosive esophagitis  . HYPERTENSION 03/15/2007  . HYPERLIPIDEMIA 10/25/2007  . OSTEOARTHRITIS 03/15/2007    right knee  . CVA (cerebral infarction) 10/27/2010  . CEREBROVASCULAR ACCIDENT, HX OF 05/17/2007  . COLONIC POLYPS, HX OF 10/25/2007  . 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 BENIGN PROSTATIC HYPERTROPHY 08/12/2007  . PERIPHERAL VASCULAR DISEASE 10/16/2007    carotids < 70%  . TRANSIENT ISCHEMIC ATTACK 03/19/2008  . Nephrolithiasis   . Vitamin d deficiency     Past Surgical History  Procedure Date  . Lung biopsy 1980    s/p  . Nasal sinus surgery     s/p  . Lithotripsy     s/p  . Rotator cuff repair     x 2    Family History  Problem Relation Age of Onset  . Alzheimer's disease Father   . Heart disease Father   . Hypertension Other   . Heart disease Other     Grandparents    History  Substance Use Topics  . Smoking status: Former Games developer  . Smokeless tobacco: Not on file  . Alcohol Use: No      Review of Systems  Unable to perform ROS   Allergies  Cefuroxime axetil; Hydrochlorothiazide; Iohexol; Losartan potassium; Lovastatin; Niacin; Pseudoeph-doxylamine-dm-apap; Ramipril; and Tramadol hcl  Home Medications   Current Outpatient Rx  Name Route Sig Dispense Refill  . ALBUTEROL SULFATE HFA 108 (90 BASE) MCG/ACT IN AERS Inhalation Inhale 2 puffs into the lungs 4 (four) times daily as needed. For wheezing    .  ALPRAZOLAM 0.5 MG PO TABS Oral Take 0.5 mg by mouth 2 (two) times daily as needed. For anxiety    . ASPIRIN EC 81 MG PO TBEC Oral Take 81 mg by mouth daily.    Marland Kitchen VITAMIN D 1000 UNITS PO TABS Oral Take 1,000 Units by mouth daily.      Marland Kitchen CLONIDINE HCL 0.1 MG PO TABS Oral Take 1 tablet (0.1 mg total) by mouth 3 (three) times daily. 90 tablet 2  . DICLOFENAC SODIUM 1 % TD GEL Topical Apply 1 application topically 4 (four) times daily. 100 g 3  . DONEPEZIL HCL 10 MG PO TABS Oral Take 10 mg by mouth every morning.    Marland Kitchen FLUTICASONE PROPIONATE  50 MCG/ACT NA SUSP Nasal 2 sprays by Nasal route daily.      Marland Kitchen HYDROCODONE-ACETAMINOPHEN 5-500 MG PO TABS Oral Take 1 tablet by mouth 4 (four) times daily as needed. For pain    . IPRATROPIUM BROMIDE 0.06 % NA SOLN Nasal Place 2 sprays into the nose 4 (four) times daily. 15 mL 12  . LOSARTAN POTASSIUM 100 MG PO TABS Oral Take 100 mg by mouth at bedtime.    . OMEPRAZOLE 20 MG PO CPDR Oral Take 1 capsule (20 mg total) by mouth daily. 90 capsule 2  . TAMSULOSIN HCL 0.4 MG PO CAPS Oral Take 1 capsule (0.4 mg total) by mouth daily. 90 capsule 2  . VERAPAMIL HCL ER (CO) 180 MG PO TB24 Oral Take 180 mg by mouth at bedtime.    . WARFARIN SODIUM 2.5 MG PO TABS Oral Take 2.5 mg by mouth See admin instructions. Take 5mg  on Monday, Wednesday, Friday, and Sunday.  On Tuesday, Thursday, and Saturday, take 2.5mg .      BP 135/63  Pulse 97  Temp(Src) 100.3 F (37.9 C) (Oral)  Resp 20  SpO2 95%  Physical Exam  Nursing note and vitals reviewed. Constitutional: He is oriented to person, place, and time. He appears well-developed and well-nourished. No distress.  HENT:  Head: Normocephalic and atraumatic.  Eyes: Conjunctivae and EOM are normal. Pupils are equal, round, and reactive to light. Right eye exhibits no nystagmus. Left eye exhibits no nystagmus.  Neck: Normal range of motion. Neck supple.  Cardiovascular: Normal rate, regular rhythm, normal heart sounds and intact distal pulses.  Exam reveals no gallop and no friction rub.   No murmur heard. Pulmonary/Chest: Effort normal. No respiratory distress. He has no wheezes. He has rales. He exhibits no tenderness.  Abdominal: Soft. Bowel sounds are normal. He exhibits no distension and no mass. There is no tenderness. There is no rebound and no guarding.  Musculoskeletal: Normal range of motion. He exhibits no edema and no tenderness.       General weakness of extremities bilaterally.   Neurological: He is alert and oriented to person, place, and time. No  cranial nerve deficit.       Shuffling gate  Skin: Skin is warm and dry. No rash noted. He is not diaphoretic. No erythema.  Psychiatric: He has a normal mood and affect.    ED Course  Procedures (including critical care time)  Labs Reviewed  CBC - Abnormal; Notable for the following:    RBC 4.16 (*)    Hemoglobin 11.7 (*)    HCT 37.5 (*)    RDW 15.7 (*)    Platelets 104 (*) CONSISTENT WITH PREVIOUS RESULT   All other components within normal limits  DIFFERENTIAL - Abnormal; Notable for the following:  Lymphocytes Relative 10 (*)    Lymphs Abs 0.5 (*)    All other components within normal limits  BASIC METABOLIC PANEL - Abnormal; Notable for the following:    Sodium 134 (*) DELTA CHECK NOTED   Glucose, Bld 107 (*)    GFR calc non Af Amer 61 (*)    GFR calc Af Amer 71 (*)    All other components within normal limits  URINALYSIS, ROUTINE W REFLEX MICROSCOPIC - Abnormal; Notable for the following:    APPearance CLOUDY (*)    Ketones, ur 15 (*)    All other components within normal limits  PROTIME-INR - Abnormal; Notable for the following:    Prothrombin Time 28.5 (*)    INR 2.63 (*)    All other components within normal limits  APTT - Abnormal; Notable for the following:    aPTT 64 (*)    All other components within normal limits  SALICYLATE LEVEL - Abnormal; Notable for the following:    Salicylate Lvl <2.0 (*)    All other components within normal limits  GLUCOSE, CAPILLARY - Abnormal; Notable for the following:    Glucose-Capillary 116 (*)    All other components within normal limits  LIPASE, BLOOD  ETHANOL  URINE RAPID DRUG SCREEN (HOSP PERFORMED)  LACTIC ACID, PLASMA  PROCALCITONIN  HEPATIC FUNCTION PANEL  ACETAMINOPHEN LEVEL  POCT I-STAT TROPONIN I  CULTURE, BLOOD (ROUTINE X 2)  CULTURE, BLOOD (ROUTINE X 2)  URINE CULTURE   Dg Chest 2 View  10/08/2011  *RADIOLOGY REPORT*  Clinical Data: Fever and cough.  CHEST - 2 VIEW  Comparison: Chest x-ray 10/17/2011.   Findings: The cardiac silhouette, mediastinal and hilar contours are within normal limits and stable.  There are patchy bibasilar infiltrates.  No effusions or edema.  The bony thorax is intact.  IMPRESSION: Patchy bibasilar infiltrates.  Original Report Authenticated By: P. Loralie Champagne, M.D.   Dg Chest 2 View  10/07/2011  *RADIOLOGY REPORT*  Clinical Data: Cough for 2 days.  Chest pain and back pain  CHEST - 2 VIEW  Comparison: 03/17/2008  Findings: Normal heart size and pulmonary vascularity.  Scattered fibrosis in the lungs with suggestion of mild emphysematous changes.  No focal airspace consolidation.  No blunting of costophrenic angles.  No pneumothorax.  Degenerative changes in the thoracic spine.  Calcification of the aorta.  No significant change since previous study. Postoperative changes in the right shoulder.  IMPRESSION: No evidence of active pulmonary disease.  Original Report Authenticated By: Marlon Pel, M.D.   Ct Head Wo Contrast  10/08/2011  *RADIOLOGY REPORT*  Clinical Data: Altered mental status, fever, cough  CT HEAD WITHOUT CONTRAST  Technique:  Contiguous axial images were obtained from the base of the skull through the vertex without contrast.  Comparison: 11/16/10  Findings: No skull fracture is noted.  There is mucosal thickening with opacification left maxillary sinus.  Mild mucosal thickening left ethmoid air cells.  The mastoid air cells are unremarkable.  No intracranial hemorrhage, mass effect or midline shift.  Stable cerebral atrophy.  Stable periventricular chronic white matter disease.  Old infarct in the left occipital lobe is stable from prior exam.  No definite acute cortical infarction.  No mass lesion is noted on this unenhanced scan.  IMPRESSION:  1.  No acute intracranial abnormality.  Stable atrophy and chronic white matter disease.  Stable old left occipital infarct. 2.  Sinuses disease in the left ethmoid air cells and left maxillary sinus.  Original  Report Authenticated By: Natasha Mead, M.D.    Date: 10/08/2011  Rate: 84  Rhythm: normal sinus rhythm  QRS Axis: normal  Intervals: normal  ST/T Wave abnormalities: normal  Conduction Disutrbances:none  Narrative Interpretation:   Old EKG Reviewed: non provocative EKG compared to Jun 26, 2008  IV avelox given after blood cultures drawn.  1. Altered mental status   2. CAP (community acquired pneumonia)       MDM  CAP, continuing to monitor VSS. [facility] team #3 to admit. VSS.         [place], [place] 10/08/11 1840

## 2011-10-09 ENCOUNTER — Telehealth: Payer: Self-pay

## 2011-10-09 DIAGNOSIS — J159 Unspecified bacterial pneumonia: Secondary | ICD-10-CM | POA: Diagnosis not present

## 2011-10-09 DIAGNOSIS — I10 Essential (primary) hypertension: Secondary | ICD-10-CM | POA: Diagnosis not present

## 2011-10-09 DIAGNOSIS — D61818 Other pancytopenia: Secondary | ICD-10-CM | POA: Diagnosis not present

## 2011-10-09 DIAGNOSIS — R197 Diarrhea, unspecified: Secondary | ICD-10-CM | POA: Diagnosis not present

## 2011-10-09 LAB — BASIC METABOLIC PANEL
BUN: 15 mg/dL (ref 6–23)
Creatinine, Ser: 1 mg/dL (ref 0.50–1.35)
GFR calc non Af Amer: 73 mL/min — ABNORMAL LOW (ref >90)
Glucose, Bld: 106 mg/dL — ABNORMAL HIGH (ref 70–99)
Potassium: 3.9 mEq/L (ref 3.5–5.1)

## 2011-10-09 LAB — CBC
HCT: 33.3 % — ABNORMAL LOW (ref 39.0–52.0)
Hemoglobin: 11.1 g/dL — ABNORMAL LOW (ref 13.0–17.0)
MCH: 29.6 pg (ref 26.0–34.0)
MCHC: 33.3 g/dL (ref 30.0–36.0)
MCV: 88.8 fL (ref 78.0–100.0)

## 2011-10-09 LAB — GLUCOSE, CAPILLARY: Glucose-Capillary: 101 mg/dL — ABNORMAL HIGH (ref 70–99)

## 2011-10-09 NOTE — Telephone Encounter (Signed)
[facility] Triage Call Report Triage Record Num: [id number] Operator: Ether Griffins Patient Name: Douglas [name] Call Date & Time: 10/08/2011 2:56:46PM Patient Phone: [phone] PCP: Sonda Primes Patient Gender: Male PCP Fax : [phone] Patient DOB: 12-21-1935 [facility] Name: [facility] Reason for Call: Caller: Sherry/Spouse; PCP: Sonda Primes; CB#: [phone]; Call Reason: Confused, delusional,urinating on the floor,coughing on the floor.Onset: 10/08/2011.Temp:99.0(PO).Went to ED last night--dx with virus but has gotten worse.Advised to call 911.Wife states that she will drive him to ED. Protocol(s) Used: Confusion, Disorientation, Agitation Recommended Outcome per Protocol: Activate EMS 911 Reason for Outcome: New or worsening neuro deficits AND new or worsening confusion, disorientation or agitation Care Advice: ~ Protect the patient from falling or other harm. ~ An adult should stay with the patient, preferably one trained in CPR. ~ IMMEDIATE ACTION 10/08/2011 3:03:37PM Page 1 of 1 [email or web address]

## 2011-10-09 NOTE — ED Provider Notes (Signed)
Medical screening examination/treatment/procedure(s) were conducted as a shared visit with non-physician practitioner(s) and myself.  I personally evaluated the patient during the encounter  75yo M, seen yesterday for cough, wife states woke up confused this morning, "just not acting right."  Pt continues to cough at home.  +febrile, lungs coarse, confused re: events but otherwise neuro exam non-focal.  CAP on CXR, will tx IV abx, admit.       Laray Anger, DO 10/09/11 1824

## 2011-10-09 NOTE — Evaluation (Signed)
Physical Therapy Evaluation Patient Details Name: Douglas [name] MRN: [id number] DOB: Nov 16, 1935 Today's Date: 10/09/2011  Problem List:  Patient Active Problem List  Diagnoses  . HYPERLIPIDEMIA  . HYPERCOAGULABLE STATE, PRIMARY  . HYPERTENSION  . TRANSIENT ISCHEMIC ATTACK  . PERIPHERAL VASCULAR DISEASE  . ALLERGIC RHINITIS  . REACTIVE AIRWAY DISEASE  . TONGUE DISORDER  . GERD  . CONSTIPATION  . HEMATOCHEZIA  . BENIGN PROSTATIC HYPERTROPHY  . ERECTILE DYSFUNCTION, ORGANIC  . ACTINIC KERATOSIS  . OSTEOARTHRITIS  . LOW BACK PAIN  . PLANTAR FASCIITIS, BILATERAL  . Unspecified Myalgia and Myositis  . LEG PAIN  . FEVER UNSPECIFIED  . TREMOR  . PARESTHESIA  . FLUSHING  . HEADACHE  . DYSPNEA  . Cough  . CHEST PAIN UNSPECIFIED  . COLONIC POLYPS, HX OF  . TOBACCO USE, QUIT  . CVA (cerebral infarction)  . Memory loss  . Myalgia  . Generalized weakness  . Sinusitis  . PNA (pneumonia)  . Dementia  . Altered mental state  . BPH (benign prostatic hyperplasia)    Past Medical History:  Past Medical History  Diagnosis Date  . GERD 03/15/2007    erosive esophagitis  . HYPERTENSION 03/15/2007  . HYPERLIPIDEMIA 10/25/2007  . OSTEOARTHRITIS 03/15/2007    right knee  . CVA (cerebral infarction) 10/27/2010  . CEREBROVASCULAR ACCIDENT, HX OF 05/17/2007  . COLONIC POLYPS, HX OF 10/25/2007  . 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 BENIGN PROSTATIC HYPERTROPHY 08/12/2007  . PERIPHERAL VASCULAR DISEASE 10/16/2007    carotids < 70%  . TRANSIENT ISCHEMIC ATTACK 03/19/2008  . Nephrolithiasis   . Vitamin d deficiency    Past Surgical History:  Past Surgical History  Procedure Date  . Lung biopsy 1980    s/p  . Nasal sinus surgery     s/p  . Lithotripsy     s/p  . Rotator cuff repair     x 2    PT Assessment/Plan/Recommendation PT Assessment Clinical Impression Statement: Pt adm with PNA.  Pt doing well with mobilty.  No further PT needs.  Ready for dc home from PT  standpoint. PT Recommendation/Assessment: Patent does not need any further PT services No Skilled PT: Patient is modified independent with all activity/mobility PT Recommendation Follow Up Recommendations: No PT follow up Equipment Recommended: None recommended by PT  PT Evaluation Prior Functioning  Home Living Lives With: Spouse Type of Home: House Home Layout: One level Home Access: Level entry Home Adaptive Equipment: Straight cane Prior Function Level of Independence: Independent with basic ADLs;Independent with gait;Independent with transfers;Requires assistive device for independence (uses cane outside) Vocation: Retired Financial risk analyst Arousal/Alertness: Awake/alert Overall Cognitive Status: Appears within functional limits for tasks assessed  Extremity Assessment RLE Assessment RLE Assessment: Within Functional Limits LLE Assessment LLE Assessment: Within Functional Limits Mobility (including Balance) Bed Mobility Bed Mobility: Yes Supine to Sit: 7: Independent;HOB flat Sitting - Scoot to Edge of Bed: 7: Independent Transfers Transfers: Yes Sit to Stand: 7: Independent;From bed;With upper extremity assist Stand to Sit: 7: Independent;To chair/3-in-1;With upper extremity assist;With armrests Ambulation/Gait Ambulation/Gait: Yes Ambulation/Gait Assistance: 6: Modified independent (Device/Increase time) Ambulation Distance (Feet): 400 Feet Assistive device: None Gait Pattern: Decreased step length - right Gait velocity: Steady cadence  Balance Balance Assessed: Yes Dynamic Standing Balance Dynamic Standing - Level of Assistance: 6: Modified independent (Device/Increase time) End of Session PT - End of Session Activity Tolerance: Patient tolerated treatment well Patient left: in chair;with call bell in reach;with family/visitor present Nurse Communication: Mobility  status for ambulation (that pt's family can amb with him in hallways) General Behavior  During Session: [facility] for tasks performed Cognition: [facility] for tasks performed  [facility] 10/09/2011, 11:34 AM  [facility] PT [phone]

## 2011-10-09 NOTE — Telephone Encounter (Signed)
Pls call - is he better?. Do I need to see him this week? Thx

## 2011-10-09 NOTE — Progress Notes (Signed)
Pharmacy Consult - Coumadin  INR = 2.69 No bleeding noted History of CVA  Plan: 1) Continue home regimen 2) Follow up AM INR, on Avelox  Thank you. Okey Regal, PharmD

## 2011-10-09 NOTE — Progress Notes (Signed)
Subjective:   Chart reviewed. Patient indicates that he's feeling much better. Denies dyspnea. According to patient spouse who is at the bedside, patient's mental status is back to baseline.  Objective  Vital signs in last 24 hours: Filed Vitals:   10/09/11 0525 10/09/11 0947 10/09/11 1025 10/09/11 1401  BP: 118/77 128/60  152/60  Pulse: 71   77  Temp: 98.1 F (36.7 C)   98.3 F (36.8 C)  TempSrc: Oral   Oral  Resp: 20   20  Height:      Weight:      SpO2: 96%  93% 96%   Weight change:   Intake/Output Summary (Last 24 hours) at 10/09/11 1548 Last data filed at 10/09/11 1401  Gross per 24 hour  Intake 2091.67 ml  Output    375 ml  Net 1716.67 ml    Physical Exam:  General Exam: Comfortable. Sitting on a chair and in no obvious distress. Respiratory System: Slightly reduced breath sounds in the bases with occasional basal crackles. Rest of the lung fields are clear. No increased work of breathing.  Cardiovascular System: First and second heart sounds heard. Regular rate and rhythm. No JVD/murmurs.  Gastrointestinal System: Abdomen is non distended, soft and normal bowel sounds heard.  Central Nervous System: Alert and oriented. No focal neurological deficits.  Labs:  Basic Metabolic Panel:  Lab 10/09/11 1610 10/08/11 1634 10/07/11 2200  NA 139 134* 141  K 3.9 3.9 4.2  CL 106 100 107  CO2 23 25 --  GLUCOSE 106* 107* 136*  BUN 15 17 19   CREATININE 1.00 1.15 1.20  CALCIUM 8.7 9.3 --  ALB -- -- --  PHOS -- -- --   Liver Function Tests:  Lab 10/08/11 1634  AST 25  ALT 14  ALKPHOS 66  BILITOT 0.8  PROT 7.0  ALBUMIN 3.6    Lab 10/08/11 1634  LIPASE 26  AMYLASE --   No results found for this basename: AMMONIA:3 in the last 168 hours CBC:  Lab 10/09/11 0507 10/08/11 1634 10/07/11 2200 10/07/11 2148  WBC 3.6* 5.3 -- 5.6  NEUTROABS -- 4.0 -- 3.9  HGB 11.1* 11.7* 12.2* --  HCT 33.3* 37.5* 36.0* --  MCV 88.8 90.1 -- 89.9  PLT 91* 104* -- 124*   Cardiac  Enzymes: No results found for this basename: CKTOTAL:5,CKMB:5,CKMBINDEX:5,TROPONINI:5 in the last 168 hours CBG:  Lab 10/09/11 0745 10/08/11 1736  GLUCAP 101* 116*    Iron Studies: No results found for this basename: IRON,TIBC,TRANSFERRIN,FERRITIN in the last 72 hours Studies/Results: Dg Chest 2 View  10/08/2011  *RADIOLOGY REPORT*  Clinical Data: Fever and cough.  CHEST - 2 VIEW  Comparison: Chest x-ray 10/17/2011.  Findings: The cardiac silhouette, mediastinal and hilar contours are within normal limits and stable.  There are patchy bibasilar infiltrates.  No effusions or edema.  The bony thorax is intact.  IMPRESSION: Patchy bibasilar infiltrates.  Original Report Authenticated By: P. Loralie Champagne, M.D.   Dg Chest 2 View  10/07/2011  *RADIOLOGY REPORT*  Clinical Data: Cough for 2 days.  Chest pain and back pain  CHEST - 2 VIEW  Comparison: 03/17/2008  Findings: Normal heart size and pulmonary vascularity.  Scattered fibrosis in the lungs with suggestion of mild emphysematous changes.  No focal airspace consolidation.  No blunting of costophrenic angles.  No pneumothorax.  Degenerative changes in the thoracic spine.  Calcification of the aorta.  No significant change since previous study. Postoperative changes in the right shoulder.  IMPRESSION: No evidence of active pulmonary disease.  Original Report Authenticated By: Marlon Pel, M.D.   Ct Head Wo Contrast  10/08/2011  *RADIOLOGY REPORT*  Clinical Data: Altered mental status, fever, cough  CT HEAD WITHOUT CONTRAST  Technique:  Contiguous axial images were obtained from the base of the skull through the vertex without contrast.  Comparison: 11/16/10  Findings: No skull fracture is noted.  There is mucosal thickening with opacification left maxillary sinus.  Mild mucosal thickening left ethmoid air cells.  The mastoid air cells are unremarkable.  No intracranial hemorrhage, mass effect or midline shift.  Stable cerebral atrophy.  Stable  periventricular chronic white matter disease.  Old infarct in the left occipital lobe is stable from prior exam.  No definite acute cortical infarction.  No mass lesion is noted on this unenhanced scan.  IMPRESSION:  1.  No acute intracranial abnormality.  Stable atrophy and chronic white matter disease.  Stable old left occipital infarct. 2.  Sinuses disease in the left ethmoid air cells and left maxillary sinus.  Original Report Authenticated By: Natasha Mead, M.D.   Medications:    . sodium chloride 40 mL/hr at 10/09/11 0557  . DISCONTD: sodium chloride        . sodium chloride   Intravenous STAT  . acetaminophen  975 mg Rectal Once  . aspirin EC  81 mg Oral Daily  . cholecalciferol  1,000 Units Oral Daily  . cloNIDine  0.1 mg Oral TID  . donepezil  10 mg Oral Daily  . fluticasone  2 spray Each Nare Daily  . ipratropium  2 spray Nasal QID  . losartan  100 mg Oral Daily  . moxifloxacin  400 mg Intravenous Once  . moxifloxacin  400 mg Intravenous Q24H  . pantoprazole  40 mg Oral Q1200  . senna  1 tablet Oral BID  . sodium chloride  500 mL Intravenous Once  . Tamsulosin HCl  0.4 mg Oral Daily  . verapamil  180 mg Oral QHS  . warfarin  2.5 mg Oral Custom  . warfarin  5 mg Oral Custom  . DISCONTD: albuterol  2.5 mg Nebulization Q6H  . DISCONTD: verapamil  180 mg Oral QHS  . DISCONTD: warfarin  2.5 mg Oral See admin instructions  . DISCONTD: warfarin  2.5 mg Oral Custom  . DISCONTD: warfarin  2.5 mg Oral Custom  . DISCONTD: warfarin  5 mg Oral Custom  . DISCONTD: warfarin  5 mg Oral Custom    I  have reviewed scheduled and prn medications.     Problem/Plan: Principal Problem:  *PNA (pneumonia) Active Problems:  HYPERTENSION  Dementia  Altered mental state  BPH (benign prostatic hyperplasia)  1. Community acquired pneumonia: Continue Avelox. 2. Encephalopathy secondary to pneumonia: Resolved. Monitor. 3. Pancytopenia: Unclear etiology. Follow CBCs  tomorrow. 4. Hypertension: Reasonable control. Continue home medications including clonidine, verapamil and Cozaar. 5. Dementia: Mental status at baseline. 6. Benign prostatic hypertrophy: Asymptomatic currently. 7. History of prior CVA and hypercoagulable state: Continue anticoagulation 8. Full Code.  Discussed with patient's spouse at the bedside and updated care.   Buelah Rennie 10/09/2011,3:48 PM  LOS: 1 day

## 2011-10-09 NOTE — Progress Notes (Signed)
10/09/2011 Levar Fayson SPARKS Case Management Note [phone]  Utilization review completed.  

## 2011-10-10 DIAGNOSIS — D61818 Other pancytopenia: Secondary | ICD-10-CM | POA: Diagnosis not present

## 2011-10-10 DIAGNOSIS — J159 Unspecified bacterial pneumonia: Secondary | ICD-10-CM | POA: Diagnosis not present

## 2011-10-10 DIAGNOSIS — I10 Essential (primary) hypertension: Secondary | ICD-10-CM | POA: Diagnosis not present

## 2011-10-10 DIAGNOSIS — R197 Diarrhea, unspecified: Secondary | ICD-10-CM | POA: Diagnosis not present

## 2011-10-10 LAB — CBC
MCV: 87.9 fL (ref 78.0–100.0)
Platelets: 91 10*3/uL — ABNORMAL LOW (ref 150–400)
RBC: 3.96 MIL/uL — ABNORMAL LOW (ref 4.22–5.81)
WBC: 3.8 10*3/uL — ABNORMAL LOW (ref 4.0–10.5)

## 2011-10-10 LAB — URINE CULTURE: Culture  Setup Time: 201302110256

## 2011-10-10 LAB — GLUCOSE, CAPILLARY: Glucose-Capillary: 112 mg/dL — ABNORMAL HIGH (ref 70–99)

## 2011-10-10 MED ORDER — MOXIFLOXACIN HCL 400 MG PO TABS: 400.0000 mg | ORAL_TABLET | Freq: Every day | ORAL | Status: DC

## 2011-10-10 NOTE — Telephone Encounter (Signed)
Pt is currently hospitalized. Dx with Pneumonia.

## 2011-10-10 NOTE — Progress Notes (Signed)
O2 sat on ambulation and room air was 93%.

## 2011-10-10 NOTE — Discharge Summary (Addendum)
Discharge Summary  Douglas [name] MR#: [id number]  DOB:[birth year]  Date of Admission: 10/08/2011 Date of Discharge: 10/10/2011  Patient's PCP: Sonda Primes, MD, MD  Attending Physician:Khamora Karan  Consults: None  Discharge Diagnoses: Principal Problem:  *PNA (pneumonia) Active Problems:  HYPERTENSION  Dementia  Altered mental state  BPH (benign prostatic hyperplasia)   Brief Admitting History and Physical 76 year old Caucasian male patient with history of dementia, hypertension, CVA, hypercoagulable state who is on chronic Coumadin therapy was admitted with cough and fever of 3 days' duration. His temperature was as high as 102F. He was first brought to the emergency room and was thought to have a viral syndrome and was discharged home. He developed worsening mental status changes. His family called his primary care physician and patient was advised to come to the emergency room where chest x-ray was suggestive of bilateral infiltrates.  Discharge Medications Current Discharge Medication List    START taking these medications   Details  moxifloxacin (AVELOX) 400 MG tablet Take 1 tablet (400 mg total) by mouth daily. Qty: 5 tablet, Refills: 0      CONTINUE these medications which have NOT CHANGED   Details  albuterol (PROAIR HFA) 108 (90 BASE) MCG/ACT inhaler Inhale 2 puffs into the lungs 4 (four) times daily as needed. For wheezing    ALPRAZolam (XANAX) 0.5 MG tablet Take 0.5 mg by mouth 2 (two) times daily as needed. For anxiety    aspirin EC 81 MG tablet Take 81 mg by mouth daily.    cholecalciferol (VITAMIN D) 1000 UNITS tablet Take 1,000 Units by mouth daily.      cloNIDine (CATAPRES) 0.1 MG tablet Take 1 tablet (0.1 mg total) by mouth 3 (three) times daily. Qty: 90 tablet, Refills: 2    diclofenac sodium (VOLTAREN) 1 % GEL Apply 1 application topically 4 (four) times daily. Qty: 100 g, Refills: 3    donepezil (ARICEPT) 10 MG tablet Take 10 mg by mouth  every morning.    fluticasone (FLONASE) 50 MCG/ACT nasal spray 2 sprays by Nasal route daily.      HYDROcodone-acetaminophen (VICODIN) 5-500 MG per tablet Take 1 tablet by mouth 4 (four) times daily as needed. For pain    ipratropium (ATROVENT) 0.06 % nasal spray Place 2 sprays into the nose 4 (four) times daily. Qty: 15 mL, Refills: 12    losartan (COZAAR) 100 MG tablet Take 100 mg by mouth at bedtime.    omeprazole (PRILOSEC) 20 MG capsule Take 1 capsule (20 mg total) by mouth daily. Qty: 90 capsule, Refills: 2    Tamsulosin HCl (FLOMAX) 0.4 MG CAPS Take 1 capsule (0.4 mg total) by mouth daily. Qty: 90 capsule, Refills: 2    verapamil (COVERA HS) 180 MG (CO) 24 hr tablet Take 180 mg by mouth at bedtime.    warfarin (COUMADIN) 2.5 MG tablet Take 2.5 mg by mouth See admin instructions. Take 5mg  on Monday, Wednesday, Friday, and Sunday.  On Tuesday, Thursday, and Saturday, take 2.5mg .        Hospital Course: 1. Community acquired pneumonia: Patient was started on intravenous Avelox. Patient did well and defervesced. He denies any complaints and will complete a week's course of Avelox. 2. Encephalopathy secondary to pneumonia: This was complicating his underlying dementia. Although his mental status has improved significantly compared to that on admission, according to his wife his mental status is still not at baseline and he has periods of confusion and mild agitation. Followup outpatient. 3. Pancytopenia: Unclear etiology. ? Secondary  to pneumonia . improving. Followup CBCs as an outpatient. 4. Hypertension: Reasonable control. Continue home medications including clonidine, verapamil and Cozaar. 5. Dementia:  See discussion for problem #2 . 6. Benign prostatic hypertrophy: Asymptomatic currently. 7. History of prior CVA and hypercoagulable state: Continue anticoagulation 8. Full Code.   Day of Discharge BP 109/47  Pulse 68  Temp(Src) 97.9 F (36.6 C) (Oral)  Resp 18  Ht 5'  6" (1.676 m)  Wt 72.077 kg (158 lb 14.4 oz)  BMI 25.65 kg/m2  SpO2 96%  General Exam: Comfortable. Sitting on a chair and in no obvious distress.  Respiratory System: Slightly reduced breath sounds in the bases with occasional basal crackles. Rest of the lung fields are clear. No increased work of breathing.  Cardiovascular System: First and second heart sounds heard. Regular rate and rhythm. No JVD/murmurs.  Gastrointestinal System: Abdomen is non distended, soft and normal bowel sounds heard.  Central Nervous System: Alert and oriented. No focal neurological deficits.   Results for orders placed during the hospital encounter of 10/08/11 (from the past 48 hour(s))  CBC     Status: Abnormal   Collection Time   10/09/11  5:07 AM      Component Value Range Comment   WBC 3.6 (*) 4.0 - 10.5 (K/uL)    RBC 3.75 (*) 4.22 - 5.81 (MIL/uL)    Hemoglobin 11.1 (*) 13.0 - 17.0 (g/dL)    HCT 84.1 (*) 32.4 - 52.0 (%)    MCV 88.8  78.0 - 100.0 (fL)    MCH 29.6  26.0 - 34.0 (pg)    MCHC 33.3  30.0 - 36.0 (g/dL)    RDW 40.1 (*) 02.7 - 15.5 (%)    Platelets 91 (*) 150 - 400 (K/uL) CONSISTENT WITH PREVIOUS RESULT  BASIC METABOLIC PANEL     Status: Abnormal   Collection Time   10/09/11  5:07 AM      Component Value Range Comment   Sodium 139  135 - 145 (mEq/L)    Potassium 3.9  3.5 - 5.1 (mEq/L)    Chloride 106  96 - 112 (mEq/L)    CO2 23  19 - 32 (mEq/L)    Glucose, Bld 106 (*) 70 - 99 (mg/dL)    BUN 15  6 - 23 (mg/dL)    Creatinine, Ser 2.53  0.50 - 1.35 (mg/dL)    Calcium 8.7  8.4 - 10.5 (mg/dL)    GFR calc non Af Amer 73 (*) >90 (mL/min)    GFR calc Af Amer 84 (*) >90 (mL/min)   PROTIME-INR     Status: Abnormal   Collection Time   10/09/11  5:07 AM      Component Value Range Comment   Prothrombin Time 29.0 (*) 11.6 - 15.2 (seconds)    INR 2.69 (*) 0.00 - 1.49    GLUCOSE, CAPILLARY     Status: Abnormal   Collection Time   10/09/11  7:45 AM      Component Value Range Comment    Glucose-Capillary 101 (*) 70 - 99 (mg/dL)   PROTIME-INR     Status: Abnormal   Collection Time   10/10/11  5:15 AM      Component Value Range Comment   Prothrombin Time 30.5 (*) 11.6 - 15.2 (seconds)    INR 2.87 (*) 0.00 - 1.49    CBC     Status: Abnormal   Collection Time   10/10/11  5:15 AM      Component Value  Range Comment   WBC 3.8 (*) 4.0 - 10.5 (K/uL)    RBC 3.96 (*) 4.22 - 5.81 (MIL/uL)    Hemoglobin 11.5 (*) 13.0 - 17.0 (g/dL)    HCT 40.9 (*) 81.1 - 52.0 (%)    MCV 87.9  78.0 - 100.0 (fL)    MCH 29.0  26.0 - 34.0 (pg)    MCHC 33.0  30.0 - 36.0 (g/dL)    RDW 91.4  78.2 - 95.6 (%)    Platelets 91 (*) 150 - 400 (K/uL) CONSISTENT WITH PREVIOUS RESULT  GLUCOSE, CAPILLARY     Status: Abnormal   Collection Time   10/10/11  7:51 AM      Component Value Range Comment   Glucose-Capillary 112 (*) 70 - 99 (mg/dL)     Dg Chest 2 View  10/11/863  *RADIOLOGY REPORT*  Clinical Data: Fever and cough.  CHEST - 2 VIEW  Comparison: Chest x-ray 10/17/2011.  Findings: The cardiac silhouette, mediastinal and hilar contours are within normal limits and stable.  There are patchy bibasilar infiltrates.  No effusions or edema.  The bony thorax is intact.  IMPRESSION: Patchy bibasilar infiltrates.  Original Report Authenticated By: P. Loralie Champagne, M.D.   Dg Chest 2 View  10/07/2011  *RADIOLOGY REPORT*  Clinical Data: Cough for 2 days.  Chest pain and back pain  CHEST - 2 VIEW  Comparison: 03/17/2008  Findings: Normal heart size and pulmonary vascularity.  Scattered fibrosis in the lungs with suggestion of mild emphysematous changes.  No focal airspace consolidation.  No blunting of costophrenic angles.  No pneumothorax.  Degenerative changes in the thoracic spine.  Calcification of the aorta.  No significant change since previous study. Postoperative changes in the right shoulder.  IMPRESSION: No evidence of active pulmonary disease.  Original Report Authenticated By: Marlon Pel, M.D.   Ct Head  Wo Contrast  10/08/2011  *RADIOLOGY REPORT*  Clinical Data: Altered mental status, fever, cough  CT HEAD WITHOUT CONTRAST  Technique:  Contiguous axial images were obtained from the base of the skull through the vertex without contrast.  Comparison: 11/16/10  Findings: No skull fracture is noted.  There is mucosal thickening with opacification left maxillary sinus.  Mild mucosal thickening left ethmoid air cells.  The mastoid air cells are unremarkable.  No intracranial hemorrhage, mass effect or midline shift.  Stable cerebral atrophy.  Stable periventricular chronic white matter disease.  Old infarct in the left occipital lobe is stable from prior exam.  No definite acute cortical infarction.  No mass lesion is noted on this unenhanced scan.  IMPRESSION:  1.  No acute intracranial abnormality.  Stable atrophy and chronic white matter disease.  Stable old left occipital infarct. 2.  Sinuses disease in the left ethmoid air cells and left maxillary sinus.  Original Report Authenticated By: Natasha Mead, M.D.     Disposition: Discharged home in stable condition.  Diet: Heart Healthy.  Activity: Increase activity gradually.   Follow-up Appts: Discharge Orders    Future Appointments: Provider: Department: Dept Phone: Center:   10/27/2011 8:00 AM Raul Del, RN [facility] [phone] None   11/10/2011 9:15 AM Wendall Stade, MD [facility] [phone] [facility]   11/14/2011 8:45 AM Sonda Primes, MD [facility] [phone] [facility]   12/15/2011 8:45 AM Sonda Primes, MD [facility] [phone] [facility]   01/02/2012 8:45 AM Sonda Primes, MD [facility] [phone] [facility]     Future Orders Please Complete By Expires   Diet - low sodium heart  healthy      Increase activity slowly      Call MD for:  temperature >100.4      Call MD for:  difficulty breathing, headache or visual disturbances         TESTS THAT NEED FOLLOW-UP CBC, PT & INR in 3-5 days from hospital discharge.  Discussed with  patient's primary care physician who will arrange for an appointment for the patient to be seen on 15th of February 2013 Discussed with patients spouse and updated all care.   Time spent on discharge, talking to the patient, and coordinating care: 30 mins.   SignedMarcellus Scott, MD 10/10/2011, 6:16 PM

## 2011-10-10 NOTE — Progress Notes (Signed)
Pharmacy Consult - Coumadin  INR = 2.87 No bleeding noted History of CVA  Plan: 1) Continue home regimen 2) Follow up AM INR, on Avelox  Thank you. Okey Regal, PharmD

## 2011-10-10 NOTE — Progress Notes (Signed)
Nolon Bussing Mahr to be D/C'd Home per MD order.  Discussed with the patient and all questions fully answered.   Martyn, Timme  Home Medication Instructions [id number]   Printed on:10/10/11 1418  Medication Information                    ALPRAZolam (XANAX) 0.5 MG tablet Take 0.5 mg by mouth 2 (two) times daily as needed. For anxiety           fluticasone (FLONASE) 50 MCG/ACT nasal spray 2 sprays by Nasal route daily.             albuterol (PROAIR HFA) 108 (90 BASE) MCG/ACT inhaler Inhale 2 puffs into the lungs 4 (four) times daily as needed. For wheezing           cholecalciferol (VITAMIN D) 1000 UNITS tablet Take 1,000 Units by mouth daily.             ipratropium (ATROVENT) 0.06 % nasal spray Place 2 sprays into the nose 4 (four) times daily.           cloNIDine (CATAPRES) 0.1 MG tablet Take 1 tablet (0.1 mg total) by mouth 3 (three) times daily.           omeprazole (PRILOSEC) 20 MG capsule Take 1 capsule (20 mg total) by mouth daily.           Tamsulosin HCl (FLOMAX) 0.4 MG CAPS Take 1 capsule (0.4 mg total) by mouth daily.           diclofenac sodium (VOLTAREN) 1 % GEL Apply 1 application topically 4 (four) times daily.           aspirin EC 81 MG tablet Take 81 mg by mouth daily.           verapamil (COVERA HS) 180 MG (CO) 24 hr tablet Take 180 mg by mouth at bedtime.           warfarin (COUMADIN) 2.5 MG tablet Take 2.5 mg by mouth See admin instructions. Take 5mg  on Monday, Wednesday, Friday, and Sunday.  On Tuesday, Thursday, and Saturday, take 2.5mg .           donepezil (ARICEPT) 10 MG tablet Take 10 mg by mouth every morning.           HYDROcodone-acetaminophen (VICODIN) 5-500 MG per tablet Take 1 tablet by mouth 4 (four) times daily as needed. For pain           losartan (COZAAR) 100 MG tablet Take 100 mg by mouth at bedtime.           moxifloxacin (AVELOX) 400 MG tablet Take 1 tablet (400 mg total) by mouth daily.             VVS, Skin  clean, dry and intact without evidence of skin break down, no evidence of skin tears noted. IV catheter discontinued intact. Site without signs and symptoms of complications. Dressing and pressure applied.  An After Visit Summary was printed and given to the patient. Patient escorted via WC, and D/C home via private auto.  Debbora Presto 10/10/2011 2:18 PM

## 2011-10-10 NOTE — Telephone Encounter (Signed)
He needs a f/u appt w/me this Fri ( he is being d/c'd today) Thx

## 2011-10-11 ENCOUNTER — Ambulatory Visit: Payer: Medicare Other | Admitting: Cardiovascular Disease

## 2011-10-13 ENCOUNTER — Ambulatory Visit (INDEPENDENT_AMBULATORY_CARE_PROVIDER_SITE_OTHER): Payer: Medicare Other | Admitting: Internal Medicine

## 2011-10-13 ENCOUNTER — Encounter: Payer: Self-pay | Admitting: Internal Medicine

## 2011-10-13 DIAGNOSIS — F0390 Unspecified dementia without behavioral disturbance: Secondary | ICD-10-CM | POA: Diagnosis not present

## 2011-10-13 DIAGNOSIS — R4182 Altered mental status, unspecified: Secondary | ICD-10-CM | POA: Diagnosis not present

## 2011-10-13 DIAGNOSIS — J189 Pneumonia, unspecified organism: Secondary | ICD-10-CM

## 2011-10-13 DIAGNOSIS — M545 Low back pain, unspecified: Secondary | ICD-10-CM

## 2011-10-13 MED ORDER — BUSPIRONE HCL 15 MG PO TABS: 15.0000 mg | ORAL_TABLET | Freq: Two times a day (BID) | ORAL | Status: DC

## 2011-10-13 NOTE — Progress Notes (Signed)
Patient ID: Douglas [name], male   DOB: 03-04-1936, 76 y.o.   MRN: [id number]   Post hosp visit

## 2011-10-13 NOTE — Patient Instructions (Signed)
Wean yourself from Xanax over a period of 2 wks and start Buspar for anxiety Use Tylenol for pain as needed 650 mg

## 2011-10-14 LAB — CULTURE, BLOOD (ROUTINE X 2)
Culture  Setup Time: 201302102039
Culture  Setup Time: 201302102039
Culture: NO GROWTH

## 2011-10-15 NOTE — Assessment & Plan Note (Signed)
Will stick w/non-opioid meds due to dementia

## 2011-10-15 NOTE — Assessment & Plan Note (Signed)
Aggravated by pneumonia in 2/13

## 2011-10-15 NOTE — Assessment & Plan Note (Signed)
  2/13 treated inpatient

## 2011-10-15 NOTE — Progress Notes (Signed)
Patient ID: Douglas [name], male   DOB: [birth year], 76 y.o.   MRN: [id number] Patient ID: Douglas [name], male   DOB: 1935/09/27, 76 y.o.   MRN: [id number]  Subjective:    Patient ID: Douglas [name], male    DOB: 1935-11-16, 76 y.o.   MRN: [id number]  HPI Post hosp visit for MS changes and pneumonia. Hosp records were reviewed   Review of Systems  Constitutional: Positive for fatigue. Negative for appetite change and unexpected weight change.  HENT: Negative for nosebleeds, congestion, sneezing, trouble swallowing, neck pain and sinus pressure.   Eyes: Negative for itching and visual disturbance.  Cardiovascular: Negative for palpitations and leg swelling.  Gastrointestinal: Negative for nausea, diarrhea, blood in stool and abdominal distention.  Genitourinary: Negative for frequency and hematuria.  Musculoskeletal: Negative for back pain, joint swelling, arthralgias and gait problem.  Neurological: Negative for dizziness, tremors, speech difficulty and weakness.  Psychiatric/Behavioral: Positive for decreased concentration. Negative for suicidal ideas, confusion, sleep disturbance, dysphoric mood and agitation. The patient is not nervous/anxious.        Objective:   Physical Exam  Constitutional: He is oriented to person, place, and time. He appears well-developed. No distress.  HENT:  Mouth/Throat: Oropharynx is clear and moist. No oropharyngeal exudate.       Nasal mucosa w/swelling, bloody d/c is still present  Eyes: Conjunctivae are normal. Pupils are equal, round, and reactive to light.  Neck: Normal range of motion. No JVD present. No thyromegaly present.  Cardiovascular: Normal rate, regular rhythm, normal heart sounds and intact distal pulses.  Exam reveals no gallop and no friction rub.   No murmur heard. Pulmonary/Chest: Effort normal and breath sounds normal. No respiratory distress. He has no wheezes. He has no rales. He exhibits no tenderness.  Abdominal:  Soft. Bowel sounds are normal. He exhibits no distension and no mass. There is no tenderness. There is no rebound and no guarding.  Musculoskeletal: Normal range of motion. He exhibits no edema and no tenderness.  Lymphadenopathy:    He has no cervical adenopathy.  Neurological: He is alert and oriented to person, place, and time. He has normal reflexes. No cranial nerve deficit. He exhibits normal muscle tone. Coordination normal.  Skin: Skin is warm and dry. No rash noted.  Psychiatric: He has a normal mood and affect. His behavior is normal. Judgment normal.       Sad A/o/c   Lab Results  Component Value Date   WBC 3.8* 10/10/2011   HGB 11.5* 10/10/2011   HCT 34.8* 10/10/2011   PLT 91* 10/10/2011   GLUCOSE 106* 10/09/2011   CHOL 190 11/09/2010   TRIG 159.0* 11/09/2010   HDL 33.90* 11/09/2010   LDLDIRECT 137.9 05/11/2010   LDLCALC 124* 11/09/2010   ALT 14 10/08/2011   AST 25 10/08/2011   NA 139 10/09/2011   K 3.9 10/09/2011   CL 106 10/09/2011   CREATININE 1.00 10/09/2011   BUN 15 10/09/2011   CO2 23 10/09/2011   TSH 1.48 04/27/2011   PSA 2.07 11/09/2010   INR 2.87* 10/10/2011              Assessment & Plan:

## 2011-10-15 NOTE — Assessment & Plan Note (Signed)
2/13 Dementia aggravated by pneumonia in 2/13 Resolved

## 2011-10-16 ENCOUNTER — Telehealth: Payer: Self-pay

## 2011-10-16 MED ORDER — MOXIFLOXACIN HCL 400 MG PO TABS: 400.0000 mg | ORAL_TABLET | Freq: Every day | ORAL | Status: AC

## 2011-10-16 NOTE — Telephone Encounter (Signed)
Take avelox x 10 more days (done) OV if sick Thx

## 2011-10-16 NOTE — Telephone Encounter (Signed)
Pt's spouse called stating pt still has sore throat and a productive cough. Spouse is concerned and is requesting MD advisement, should pt be on any additional medications? Please advise.

## 2011-10-17 ENCOUNTER — Ambulatory Visit (INDEPENDENT_AMBULATORY_CARE_PROVIDER_SITE_OTHER): Payer: Medicare Other | Admitting: Pharmacist

## 2011-10-17 DIAGNOSIS — G459 Transient cerebral ischemic attack, unspecified: Secondary | ICD-10-CM

## 2011-10-17 DIAGNOSIS — I639 Cerebral infarction, unspecified: Secondary | ICD-10-CM

## 2011-10-17 DIAGNOSIS — I6350 Cerebral infarction due to unspecified occlusion or stenosis of unspecified cerebral artery: Secondary | ICD-10-CM

## 2011-10-17 DIAGNOSIS — Z8679 Personal history of other diseases of the circulatory system: Secondary | ICD-10-CM

## 2011-10-17 LAB — POCT INR: INR: 3.7

## 2011-10-17 NOTE — Telephone Encounter (Signed)
Pt informed. Pt states Avelox may have caused his INR to be 3.7. He wants to know what 10 more days of it will do/will it be ok? Please advise.

## 2011-10-17 NOTE — Telephone Encounter (Signed)
Check INR in 7-10 d Thx

## 2011-10-18 NOTE — Telephone Encounter (Signed)
Pt informed

## 2011-10-20 DIAGNOSIS — J342 Deviated nasal septum: Secondary | ICD-10-CM | POA: Diagnosis not present

## 2011-10-20 DIAGNOSIS — J322 Chronic ethmoidal sinusitis: Secondary | ICD-10-CM | POA: Diagnosis not present

## 2011-10-20 DIAGNOSIS — F068 Other specified mental disorders due to known physiological condition: Secondary | ICD-10-CM | POA: Diagnosis not present

## 2011-10-20 DIAGNOSIS — N200 Calculus of kidney: Secondary | ICD-10-CM | POA: Diagnosis not present

## 2011-10-20 DIAGNOSIS — J320 Chronic maxillary sinusitis: Secondary | ICD-10-CM | POA: Diagnosis not present

## 2011-10-20 DIAGNOSIS — R413 Other amnesia: Secondary | ICD-10-CM | POA: Diagnosis not present

## 2011-10-20 DIAGNOSIS — N400 Enlarged prostate without lower urinary tract symptoms: Secondary | ICD-10-CM | POA: Diagnosis not present

## 2011-10-23 ENCOUNTER — Ambulatory Visit (INDEPENDENT_AMBULATORY_CARE_PROVIDER_SITE_OTHER): Payer: Medicare Other | Admitting: Pharmacist

## 2011-10-23 DIAGNOSIS — Z8679 Personal history of other diseases of the circulatory system: Secondary | ICD-10-CM | POA: Diagnosis not present

## 2011-10-23 DIAGNOSIS — G459 Transient cerebral ischemic attack, unspecified: Secondary | ICD-10-CM

## 2011-10-23 DIAGNOSIS — I639 Cerebral infarction, unspecified: Secondary | ICD-10-CM

## 2011-10-23 DIAGNOSIS — I6350 Cerebral infarction due to unspecified occlusion or stenosis of unspecified cerebral artery: Secondary | ICD-10-CM

## 2011-10-23 LAB — POCT INR: INR: 3.3

## 2011-11-10 ENCOUNTER — Ambulatory Visit (INDEPENDENT_AMBULATORY_CARE_PROVIDER_SITE_OTHER): Payer: Medicare Other | Admitting: Cardiovascular Disease

## 2011-11-10 ENCOUNTER — Ambulatory Visit (INDEPENDENT_AMBULATORY_CARE_PROVIDER_SITE_OTHER): Payer: Medicare Other | Admitting: *Deleted

## 2011-11-10 ENCOUNTER — Encounter: Payer: Self-pay | Admitting: Cardiovascular Disease

## 2011-11-10 VITALS — BP 98/53 | HR 58 | Ht 67.0 in | Wt 170.1 lb

## 2011-11-10 DIAGNOSIS — K219 Gastro-esophageal reflux disease without esophagitis: Secondary | ICD-10-CM | POA: Diagnosis not present

## 2011-11-10 DIAGNOSIS — I639 Cerebral infarction, unspecified: Secondary | ICD-10-CM

## 2011-11-10 DIAGNOSIS — I6350 Cerebral infarction due to unspecified occlusion or stenosis of unspecified cerebral artery: Secondary | ICD-10-CM

## 2011-11-10 DIAGNOSIS — D6859 Other primary thrombophilia: Secondary | ICD-10-CM

## 2011-11-10 DIAGNOSIS — G459 Transient cerebral ischemic attack, unspecified: Secondary | ICD-10-CM | POA: Diagnosis not present

## 2011-11-10 DIAGNOSIS — Z8679 Personal history of other diseases of the circulatory system: Secondary | ICD-10-CM

## 2011-11-10 DIAGNOSIS — I10 Essential (primary) hypertension: Secondary | ICD-10-CM | POA: Diagnosis not present

## 2011-11-10 DIAGNOSIS — F0390 Unspecified dementia without behavioral disturbance: Secondary | ICD-10-CM

## 2011-11-10 DIAGNOSIS — R05 Cough: Secondary | ICD-10-CM | POA: Diagnosis not present

## 2011-11-10 DIAGNOSIS — J342 Deviated nasal septum: Secondary | ICD-10-CM | POA: Diagnosis not present

## 2011-11-10 DIAGNOSIS — E785 Hyperlipidemia, unspecified: Secondary | ICD-10-CM | POA: Diagnosis not present

## 2011-11-10 DIAGNOSIS — J320 Chronic maxillary sinusitis: Secondary | ICD-10-CM | POA: Diagnosis not present

## 2011-11-10 NOTE — Assessment & Plan Note (Signed)
Doing well.  Continue Aricept.  Daughter looks after him well

## 2011-11-10 NOTE — Progress Notes (Signed)
Douglas [name] is a 76 yo male with a h/o a hypercoagulable state with a positive lupus anticoagulant who is on chronic Coumadin therapy. He was initially diagnosed in 2004 after suffering a stroke. He has hypertension and hyperlipidemia. Carotid Dopplers in March 2001 demonstrated 0-39% bilateral ICA stenosis. He was seen in March 2011 with complaints of chest pain. He was enrolled in the Promise trial and randomized to Myoview testing. The nuclear study demonstrated an ejection fraction of 59%, inferior and apical thinning but no ischemia. His last echocardiogram was in February 2009 and demonstrated normal LV function with an ejection fraction of 55-60%.    ROS: Denies fever, malais, weight loss, blurry vision, decreased visual acuity, cough, sputum, SOB, hemoptysis, pleuritic pain, palpitaitons, heartburn, abdominal pain, melena, lower extremity edema, claudication, or rash.  All other systems reviewed and negative  General: Affect appropriate Healthy:  appears stated age HEENT: normal Neck supple with no adenopathy JVP normal no bruits no thyromegaly Lungs clear with no wheezing and good diaphragmatic motion Heart:  S1/S2 no murmur, no rub, gallop or click PMI normal Abdomen: benighn, BS positve, no tenderness, no AAA no bruit.  No HSM or HJR Distal pulses intact with no bruits No edema Neuro non-focal Skin warm and dry No muscular weakness   Current Outpatient Prescriptions  Medication Sig Dispense Refill  . albuterol (PROAIR HFA) 108 (90 BASE) MCG/ACT inhaler Inhale 2 puffs into the lungs 4 (four) times daily as needed. For wheezing      . aspirin EC 81 MG tablet Take 81 mg by mouth daily.      . cholecalciferol (VITAMIN D) 1000 UNITS tablet Take 1,000 Units by mouth daily.        . cloNIDine (CATAPRES) 0.1 MG tablet Take 1 tablet (0.1 mg total) by mouth 3 (three) times daily.  90 tablet  2  . diclofenac sodium (VOLTAREN) 1 % GEL Apply 1 application topically 4 (four) times  daily.  100 g  3  . donepezil (ARICEPT) 10 MG tablet Take 10 mg by mouth every morning.      . fluticasone (FLONASE) 50 MCG/ACT nasal spray 2 sprays by Nasal route daily.        Marland Kitchen ipratropium (ATROVENT) 0.06 % nasal spray Place 2 sprays into the nose 4 (four) times daily.  15 mL  12  . losartan (COZAAR) 100 MG tablet Take 100 mg by mouth at bedtime.      Marland Kitchen omeprazole (PRILOSEC) 20 MG capsule Take 1 capsule (20 mg total) by mouth daily.  90 capsule  2  . Tamsulosin HCl (FLOMAX) 0.4 MG CAPS Take 1 capsule (0.4 mg total) by mouth daily.  90 capsule  2  . verapamil (COVERA HS) 180 MG (CO) 24 hr tablet Take 180 mg by mouth at bedtime.      Marland Kitchen warfarin (COUMADIN) 2.5 MG tablet Take 2.5 mg by mouth See admin instructions. Take 5mg  on Monday, Wednesday, Friday, and Sunday.  On Tuesday, Thursday, and Saturday, take 2.5mg .        Allergies  Cefuroxime axetil; Hydrochlorothiazide; Hydrocodone; Iohexol; Losartan potassium; Lovastatin; Niacin; Pseudoeph-doxylamine-dm-apap; Ramipril; and Tramadol hcl  Electrocardiogram:  Assessment and Plan

## 2011-11-10 NOTE — Assessment & Plan Note (Signed)
Continue coumadin.  NO bleeding issues.

## 2011-11-10 NOTE — Assessment & Plan Note (Signed)
Faint right carotid bruit.  No significant disease by duplex.  F/U duplex in 2 years

## 2011-11-10 NOTE — Assessment & Plan Note (Signed)
Cholesterol is at goal.  Continue current dose of statin and diet Rx.  No myalgias or side effects.  F/U  LFT's in 6 months. Lab Results  Component Value Date   LDLCALC 124* 11/09/2010             

## 2011-11-10 NOTE — Assessment & Plan Note (Signed)
BP low Will stop verapamil and see how he does.  Continue bid clonidine and cozaar

## 2011-11-10 NOTE — Progress Notes (Signed)
Addended by: Scherrie Bateman E on: 11/10/2011 09:12 AM   Modules accepted: Orders

## 2011-11-10 NOTE — Patient Instructions (Signed)
Your physician wants you to follow-up in: YEAR WITH DR Haywood Filler will receive a reminder letter in the mail two months in advance. If you don't receive a letter, please call our office to schedule the follow-up appointment. Your physician has recommended you make the following change in your medication: STOP VERAPAMIL

## 2011-11-14 ENCOUNTER — Ambulatory Visit: Payer: Medicare Other | Admitting: Internal Medicine

## 2011-11-23 ENCOUNTER — Ambulatory Visit (INDEPENDENT_AMBULATORY_CARE_PROVIDER_SITE_OTHER): Payer: Medicare Other

## 2011-11-23 DIAGNOSIS — G459 Transient cerebral ischemic attack, unspecified: Secondary | ICD-10-CM | POA: Diagnosis not present

## 2011-11-23 DIAGNOSIS — I6350 Cerebral infarction due to unspecified occlusion or stenosis of unspecified cerebral artery: Secondary | ICD-10-CM

## 2011-11-23 DIAGNOSIS — Z8679 Personal history of other diseases of the circulatory system: Secondary | ICD-10-CM | POA: Diagnosis not present

## 2011-11-23 DIAGNOSIS — I639 Cerebral infarction, unspecified: Secondary | ICD-10-CM

## 2011-11-23 LAB — POCT INR: INR: 2.1

## 2011-12-15 ENCOUNTER — Ambulatory Visit: Payer: Medicare Other | Admitting: Internal Medicine

## 2011-12-21 ENCOUNTER — Ambulatory Visit (INDEPENDENT_AMBULATORY_CARE_PROVIDER_SITE_OTHER): Payer: Medicare Other

## 2011-12-21 DIAGNOSIS — Z8679 Personal history of other diseases of the circulatory system: Secondary | ICD-10-CM

## 2011-12-21 DIAGNOSIS — G459 Transient cerebral ischemic attack, unspecified: Secondary | ICD-10-CM

## 2011-12-21 DIAGNOSIS — I6350 Cerebral infarction due to unspecified occlusion or stenosis of unspecified cerebral artery: Secondary | ICD-10-CM | POA: Diagnosis not present

## 2011-12-21 DIAGNOSIS — I639 Cerebral infarction, unspecified: Secondary | ICD-10-CM

## 2011-12-21 LAB — POCT INR: INR: 2.7

## 2012-01-02 ENCOUNTER — Encounter: Payer: Self-pay | Admitting: Internal Medicine

## 2012-01-02 ENCOUNTER — Ambulatory Visit (INDEPENDENT_AMBULATORY_CARE_PROVIDER_SITE_OTHER): Payer: Medicare Other | Admitting: Internal Medicine

## 2012-01-02 VITALS — BP 170/90 | HR 80 | Temp 97.8°F | Resp 16 | Wt 173.0 lb

## 2012-01-02 DIAGNOSIS — F0390 Unspecified dementia without behavioral disturbance: Secondary | ICD-10-CM

## 2012-01-02 DIAGNOSIS — M79609 Pain in unspecified limb: Secondary | ICD-10-CM | POA: Diagnosis not present

## 2012-01-02 DIAGNOSIS — E785 Hyperlipidemia, unspecified: Secondary | ICD-10-CM | POA: Diagnosis not present

## 2012-01-02 DIAGNOSIS — I10 Essential (primary) hypertension: Secondary | ICD-10-CM | POA: Diagnosis not present

## 2012-01-02 DIAGNOSIS — M545 Low back pain, unspecified: Secondary | ICD-10-CM

## 2012-01-02 MED ORDER — CLONIDINE HCL 0.1 MG PO TABS: 0.1000 mg | ORAL_TABLET | Freq: Three times a day (TID) | ORAL | Status: DC

## 2012-01-02 MED ORDER — LORATADINE 10 MG PO TABS: 10.0000 mg | ORAL_TABLET | Freq: Every day | ORAL | Status: DC

## 2012-01-02 MED ORDER — TAMSULOSIN HCL 0.4 MG PO CAPS: 0.4000 mg | ORAL_CAPSULE | Freq: Every day | ORAL | Status: DC

## 2012-01-02 MED ORDER — WARFARIN SODIUM 2.5 MG PO TABS: 2.5000 mg | ORAL_TABLET | ORAL | Status: DC

## 2012-01-02 MED ORDER — DONEPEZIL HCL 10 MG PO TABS: 10.0000 mg | ORAL_TABLET | ORAL | Status: DC

## 2012-01-02 MED ORDER — LOSARTAN POTASSIUM 100 MG PO TABS: 100.0000 mg | ORAL_TABLET | Freq: Every day | ORAL | Status: DC

## 2012-01-02 NOTE — Assessment & Plan Note (Signed)
Pt states BP is good at home Continue with current prescription therapy as reflected on the Med list.

## 2012-01-02 NOTE — Assessment & Plan Note (Signed)
Chronic Statin, Niacin intolerant  

## 2012-01-02 NOTE — Assessment & Plan Note (Signed)
R knee OA

## 2012-01-02 NOTE — Assessment & Plan Note (Signed)
2012 - mild Aggravated by pneumonia, pain meds in 2/13 5/13 doing better

## 2012-01-02 NOTE — Progress Notes (Signed)
   Subjective:    Patient ID: Douglas [name], male    DOB: Jun 03, 1936, 76 y.o.   MRN: [id number]  HPI  The patient presents for a follow-up of  chronic hypertension, chronic dyslipidemia, memory loss, OA - treated with medicines  BP Readings from Last 3 Encounters:  01/02/12 170/90  11/10/11 98/53  10/13/11 112/72   Wt Readings from Last 3 Encounters:  01/02/12 173 lb (78.472 kg)  11/10/11 170 lb 1.9 oz (77.166 kg)  10/13/11 168 lb (76.204 kg)       Review of Systems  Constitutional: Positive for fatigue. Negative for appetite change and unexpected weight change.  HENT: Negative for nosebleeds, congestion, sneezing, trouble swallowing, neck pain and sinus pressure.   Eyes: Negative for itching and visual disturbance.  Cardiovascular: Negative for palpitations and leg swelling.  Gastrointestinal: Negative for nausea, diarrhea, blood in stool and abdominal distention.  Genitourinary: Negative for frequency and hematuria.  Musculoskeletal: Negative for back pain, joint swelling, arthralgias and gait problem.  Neurological: Negative for dizziness, tremors, speech difficulty and weakness.  Psychiatric/Behavioral: Positive for decreased concentration. Negative for suicidal ideas, confusion, sleep disturbance, dysphoric mood and agitation. The patient is not nervous/anxious.        Objective:   Physical Exam  Constitutional: He is oriented to person, place, and time. He appears well-developed. No distress.  HENT:  Mouth/Throat: Oropharynx is clear and moist. No oropharyngeal exudate.       Nasal mucosa w/swelling, bloody d/c is still present  Eyes: Conjunctivae are normal. Pupils are equal, round, and reactive to light.  Neck: Normal range of motion. No JVD present. No thyromegaly present.  Cardiovascular: Normal rate, regular rhythm, normal heart sounds and intact distal pulses.  Exam reveals no gallop and no friction rub.   No murmur heard. Pulmonary/Chest: Effort normal  and breath sounds normal. No respiratory distress. He has no wheezes. He has no rales. He exhibits no tenderness.  Abdominal: Soft. Bowel sounds are normal. He exhibits no distension and no mass. There is no tenderness. There is no rebound and no guarding.  Musculoskeletal: Normal range of motion. He exhibits no edema and no tenderness.  Lymphadenopathy:    He has no cervical adenopathy.  Neurological: He is alert and oriented to person, place, and time. He has normal reflexes. No cranial nerve deficit. He exhibits normal muscle tone. Coordination normal.  Skin: Skin is warm and dry. No rash noted.  Psychiatric: He has a normal mood and affect. His behavior is normal. Judgment normal.       Sad A/o/c   Lab Results  Component Value Date   WBC 3.8* 10/10/2011   HGB 11.5* 10/10/2011   HCT 34.8* 10/10/2011   PLT 91* 10/10/2011   GLUCOSE 106* 10/09/2011   CHOL 190 11/09/2010   TRIG 159.0* 11/09/2010   HDL 33.90* 11/09/2010   LDLDIRECT 137.9 05/11/2010   LDLCALC 124* 11/09/2010   ALT 14 10/08/2011   AST 25 10/08/2011   NA 139 10/09/2011   K 3.9 10/09/2011   CL 106 10/09/2011   CREATININE 1.00 10/09/2011   BUN 15 10/09/2011   CO2 23 10/09/2011   TSH 1.48 04/27/2011   PSA 2.07 11/09/2010   INR 2.7 12/21/2011              Assessment & Plan:

## 2012-01-02 NOTE — Assessment & Plan Note (Signed)
Continue with current prescription therapy as reflected on the Med list.  

## 2012-01-05 DIAGNOSIS — K219 Gastro-esophageal reflux disease without esophagitis: Secondary | ICD-10-CM | POA: Diagnosis not present

## 2012-01-05 DIAGNOSIS — R05 Cough: Secondary | ICD-10-CM | POA: Diagnosis not present

## 2012-01-05 DIAGNOSIS — J342 Deviated nasal septum: Secondary | ICD-10-CM | POA: Diagnosis not present

## 2012-01-05 DIAGNOSIS — R1310 Dysphagia, unspecified: Secondary | ICD-10-CM | POA: Diagnosis not present

## 2012-01-10 ENCOUNTER — Other Ambulatory Visit: Payer: Self-pay | Admitting: Otolaryngology

## 2012-01-11 ENCOUNTER — Other Ambulatory Visit: Payer: Self-pay | Admitting: Otolaryngology

## 2012-01-11 DIAGNOSIS — R05 Cough: Secondary | ICD-10-CM

## 2012-01-11 DIAGNOSIS — R1310 Dysphagia, unspecified: Secondary | ICD-10-CM

## 2012-01-16 ENCOUNTER — Ambulatory Visit (INDEPENDENT_AMBULATORY_CARE_PROVIDER_SITE_OTHER): Payer: Medicare Other | Admitting: *Deleted

## 2012-01-16 DIAGNOSIS — I6350 Cerebral infarction due to unspecified occlusion or stenosis of unspecified cerebral artery: Secondary | ICD-10-CM | POA: Diagnosis not present

## 2012-01-16 DIAGNOSIS — G459 Transient cerebral ischemic attack, unspecified: Secondary | ICD-10-CM | POA: Diagnosis not present

## 2012-01-16 DIAGNOSIS — Z8679 Personal history of other diseases of the circulatory system: Secondary | ICD-10-CM

## 2012-01-19 ENCOUNTER — Telehealth: Payer: Self-pay | Admitting: Cardiovascular Disease

## 2012-01-19 MED ORDER — VERAPAMIL HCL 180 MG (CO) PO TB24: 180.0000 mg | ORAL_TABLET | Freq: Every day | ORAL | Status: DC

## 2012-01-19 NOTE — Telephone Encounter (Signed)
Patient's wife called, stated patient's B/P has been elevated since verapamil was stopped at last visit 11/10/11.States B/P ranging 176/80.Wife was told Dr.Nishan out of today will check with Norma Fredrickson NP.Spoke with Norma Fredrickson NP and she advised to restart verapamil 180 mg at night.Advised to monitor B/P and a appointment scheduled with Norma Fredrickson NP 02/16/12.Advised to call back sooner if needed.

## 2012-01-19 NOTE — Telephone Encounter (Signed)
At last OV pt was taken off one of his b/p meds and now he is having elevated b/p issues and wants to know if he can go back on the meds

## 2012-01-23 ENCOUNTER — Ambulatory Visit
Admission: RE | Admit: 2012-01-23 | Discharge: 2012-01-23 | Disposition: A | Discharge status: Home / Self Care | Payer: Medicare Other | Source: Ambulatory Visit | Attending: Otolaryngology | Admitting: Otolaryngology

## 2012-01-23 DIAGNOSIS — K449 Diaphragmatic hernia without obstruction or gangrene: Secondary | ICD-10-CM | POA: Diagnosis not present

## 2012-01-23 DIAGNOSIS — K219 Gastro-esophageal reflux disease without esophagitis: Secondary | ICD-10-CM | POA: Diagnosis not present

## 2012-01-23 DIAGNOSIS — R059 Cough, unspecified: Secondary | ICD-10-CM

## 2012-01-23 DIAGNOSIS — R1310 Dysphagia, unspecified: Secondary | ICD-10-CM

## 2012-01-23 DIAGNOSIS — R05 Cough: Secondary | ICD-10-CM

## 2012-01-30 ENCOUNTER — Encounter: Payer: Self-pay | Admitting: Pulmonary Disease

## 2012-01-31 ENCOUNTER — Encounter: Payer: Self-pay | Admitting: Pulmonary Disease

## 2012-01-31 ENCOUNTER — Ambulatory Visit (INDEPENDENT_AMBULATORY_CARE_PROVIDER_SITE_OTHER): Payer: Medicare Other | Admitting: Pulmonary Disease

## 2012-01-31 VITALS — BP 112/66 | HR 74 | Temp 97.8°F | Ht 64.0 in | Wt 169.3 lb

## 2012-01-31 DIAGNOSIS — R05 Cough: Secondary | ICD-10-CM

## 2012-01-31 MED ORDER — BENZONATATE 100 MG PO CAPS: 100.0000 mg | ORAL_CAPSULE | Freq: Four times a day (QID) | ORAL | Status: AC | PRN

## 2012-01-31 MED ORDER — OMEPRAZOLE 40 MG PO CPDR: 40.0000 mg | DELAYED_RELEASE_CAPSULE | Freq: Two times a day (BID) | ORAL | Status: DC

## 2012-01-31 NOTE — Patient Instructions (Addendum)
Will change omeprazole to 40mg  in am AND pm, and add zantac 150mg  at bedtime as well. Get chlorpheniramine 4mg  over the counter, and take 2 at bedtime and one at lunch everyday for next 3 weeks. Continue on nasal spray 2 each nostril each am.   If you have nasal congestion, use neilmed sinus rinses am and pm until better. No throat clearing!!  Use hard candy (no mint or menthol or cough drops) to bathe the back of your throat all during the day. Limit voice use as much as possible, no yelling or singing. Can take tessalon pearls 100mg  2 every 6hrs if needed for cough.  Will do a followup cxr after your ?episode of pneumonia.  Will call you with results. followup with me in 3 weeks.

## 2012-01-31 NOTE — Progress Notes (Signed)
  Subjective:    Patient ID: Douglas [name], male    DOB: 06-17-1936, 76 y.o.   MRN: [id number]  HPI The patient is a 76 year old male who been asked to see for chronic cough.  The patient states he has had a cough for greater than one year, but does not feel that it is progressive in nature.  It is primarily dry, but he will occasionally produce clear or discolored mucus.  The patient feels a fullness in his throat, and has been having frequent throat clearing.  He has definite postnasal drip, and had a scan of his sinuses in February of this year that did show thickening and opacification of his left maxillary sinus.  He has been evaluated by otolaryngology, and had a normal upper airway exam except for post-glottic erythema.  He was treated with a trial of once a day proton pump inhibitor, without change in his cough.  He recently underwent a barium swallow that did show a small hiatal hernia and moderate GI reflux.  The patient only has occasional reflux symptoms.  He states his cough is worse at night upon lying down, but is not worsened with conversation or strong odors.  He had a chest x-ray in February that showed questionable bibasilar airspace disease, but has not had a followup film.  That raises the question of possible aspiration.  The patient denies any significant dyspnea on exertion.   Review of Systems  Constitutional: Positive for appetite change. Negative for fever and unexpected weight change.  HENT: Positive for congestion, sore throat and sneezing. Negative for ear pain, nosebleeds, rhinorrhea, trouble swallowing, dental problem, postnasal drip and sinus pressure.   Eyes: Negative.  Negative for redness and itching.  Respiratory: Positive for cough. Negative for chest tightness, shortness of breath and wheezing.   Cardiovascular: Positive for chest pain and leg swelling. Negative for palpitations.  Gastrointestinal: Negative for nausea and vomiting.       Heartburn     Genitourinary: Negative.  Negative for dysuria.  Musculoskeletal: Positive for arthralgias. Negative for joint swelling.  Skin: Negative.  Negative for rash.  Neurological: Positive for headaches.  Hematological: Negative.  Does not bruise/bleed easily.  Psychiatric/Behavioral: Negative for dysphoric mood. The patient is nervous/anxious.        Objective:   Physical Exam Constitutional:  Well developed, no acute distress  HENT:  Nares patent without discharge, but narrowed bilat.   Oropharynx without exudate, palate and uvula are normal  Eyes:  Perrla, eomi, no scleral icterus  Neck:  No JVD, no TMG  Cardiovascular:  Normal rate, regular rhythm, no rubs or gallops.  No murmurs        Intact distal pulses  Pulmonary :  Normal breath sounds, no stridor or respiratory distress   No rales, rhonchi, or wheezing  Abdominal:  Soft, nondistended, bowel sounds present.  No tenderness noted.   Musculoskeletal:  No lower extremity edema noted.  Lymph Nodes:  No cervical lymphadenopathy noted  Skin:  No cyanosis noted  Neurologic:  Alert, appropriate, moves all 4 extremities without obvious deficit.         Assessment & Plan:

## 2012-01-31 NOTE — Assessment & Plan Note (Addendum)
CXR 09/2011:  ?bibasilar infiltrates. CT sinuses 2013:  Thickening and opacification left maxillary sinus Barium Swallow 2013:  Small HH with moderate reflux noted.  ENT exam 12/2011:  postglottic erythema noted, but no structural abnormalities.  Spirometry 2013:  Normal flows.   The patient's cough sounds more upper airway in origin than lower.  His cough is worse at night, especially upon lying down, and therefore raises suspicion that reflux and postnasal drip are major issues for him.  He has not had a response in his cough to once a day proton pump inhibitor, but with moderate reflux on his barium swallow, would like to treat him more aggressively for this.  He also had evidence for chronic sinusitis on his CT in February, and therefore would like to work more aggressively on a nasal hygiene regimen.  Finally, I suspect he has an element of cyclical coughing, and I have reviewed the behavioral therapies that may help with this.

## 2012-01-31 NOTE — Progress Notes (Signed)
Addended by: Michel Bickers A on: 01/31/2012 04:04 PM   Modules accepted: Orders

## 2012-02-05 DIAGNOSIS — M2060 Acquired deformities of toe(s), unspecified, unspecified foot: Secondary | ICD-10-CM | POA: Diagnosis not present

## 2012-02-05 DIAGNOSIS — L03039 Cellulitis of unspecified toe: Secondary | ICD-10-CM | POA: Diagnosis not present

## 2012-02-05 DIAGNOSIS — L608 Other nail disorders: Secondary | ICD-10-CM | POA: Diagnosis not present

## 2012-02-06 ENCOUNTER — Ambulatory Visit (INDEPENDENT_AMBULATORY_CARE_PROVIDER_SITE_OTHER): Payer: Medicare Other

## 2012-02-06 DIAGNOSIS — G459 Transient cerebral ischemic attack, unspecified: Secondary | ICD-10-CM

## 2012-02-06 DIAGNOSIS — Z8679 Personal history of other diseases of the circulatory system: Secondary | ICD-10-CM

## 2012-02-06 DIAGNOSIS — I639 Cerebral infarction, unspecified: Secondary | ICD-10-CM

## 2012-02-06 DIAGNOSIS — I6350 Cerebral infarction due to unspecified occlusion or stenosis of unspecified cerebral artery: Secondary | ICD-10-CM | POA: Diagnosis not present

## 2012-02-16 ENCOUNTER — Ambulatory Visit (INDEPENDENT_AMBULATORY_CARE_PROVIDER_SITE_OTHER): Payer: Medicare Other | Admitting: Nurse Practitioner

## 2012-02-16 ENCOUNTER — Encounter: Payer: Self-pay | Admitting: Nurse Practitioner

## 2012-02-16 VITALS — BP 140/70 | HR 60 | Ht 67.0 in | Wt 144.2 lb

## 2012-02-16 DIAGNOSIS — I10 Essential (primary) hypertension: Secondary | ICD-10-CM | POA: Diagnosis not present

## 2012-02-16 NOTE — Progress Notes (Signed)
Douglas [name] Date of Birth: 1935/11/08 Medical Record #[id number]  History of Present Illness: Douglas [name] is seen back today for a follow up visit. He is seen for Dr. Eden Emms. He has a history of a hypercoagulable state with a positive lupus anticoagulate and is on chronic coumadin. He has had prior CVA, PVD, HTN, HLD and dementia. He has most recently seen Dr. Shelle Iron for a cough.   He comes in today. He was last here in March. Blood pressure was low and his Verapamil was stopped. His family called last month to report that his blood pressure had been elevated ever since stopping the Verapamil and was ranging 176/80. The Verapamil was restarted. He brings in readings which are quite labile. Down to 110 and as high as 200 systolic. No chest pain. Not dizzy or lightheaded but does get a headache every now and then. Has never had his blood pressure checked for correlation.   Current Outpatient Prescriptions on File Prior to Visit  Medication Sig Dispense Refill  . albuterol (PROAIR HFA) 108 (90 BASE) MCG/ACT inhaler Inhale 2 puffs into the lungs 4 (four) times daily as needed. For wheezing      . aspirin EC 81 MG tablet Take 81 mg by mouth daily.      . cholecalciferol (VITAMIN D) 1000 UNITS tablet Take 1,000 Units by mouth daily.        . cloNIDine (CATAPRES) 0.1 MG tablet Take 0.1 mg by mouth 2 (two) times daily.      Marland Kitchen donepezil (ARICEPT) 10 MG tablet Take 1 tablet (10 mg total) by mouth every morning.  90 tablet  3  . fluticasone (FLONASE) 50 MCG/ACT nasal spray 2 sprays by Nasal route daily.        . folic acid (FOLVITE) 800 MCG tablet Take 800 mcg by mouth daily.      Marland Kitchen ipratropium (ATROVENT) 0.06 % nasal spray Place 2 sprays into the nose 4 (four) times daily.  15 mL  12  . loratadine (CLARITIN) 10 MG tablet Take 1 tablet (10 mg total) by mouth daily.  90 tablet  3  . losartan (COZAAR) 100 MG tablet Take 1 tablet (100 mg total) by mouth at bedtime.  90 tablet  3  . omeprazole  (PRILOSEC) 40 MG capsule Take 1 capsule (40 mg total) by mouth 2 (two) times daily.  60 capsule  0  . Tamsulosin HCl (FLOMAX) 0.4 MG CAPS Take 1 capsule (0.4 mg total) by mouth daily.  90 capsule  3  . verapamil (COVERA-HS) 180 MG (CO) 24 hr tablet Take 1 tablet (180 mg total) by mouth at bedtime.  30 tablet  3  . warfarin (COUMADIN) 2.5 MG tablet Take 1 tablet (2.5 mg total) by mouth See admin instructions. Take 5mg  on Monday, Wednesday, Friday, and Sunday.  On Tuesday, Thursday, and Saturday, take 2.5mg .  100 tablet  3    Allergies  Allergen Reactions  . Cefuroxime Axetil Other (See Comments)    bad taste  . Hydrochlorothiazide Other (See Comments)    unknown  . Iohexol Hives       . Lovastatin Other (See Comments)    cramps  . Niacin Other (See Comments)    burning  . Pseudoeph-Doxylamine-Dm-Apap Other (See Comments)    tremor  . Ramipril Cough  . Tramadol Hcl Other (See Comments)    hallucinating    Past Medical History  Diagnosis Date  . GERD 03/15/2007    erosive esophagitis  .  HYPERTENSION 03/15/2007  . HYPERLIPIDEMIA 10/25/2007  . OSTEOARTHRITIS 03/15/2007    right knee  . CVA (cerebral infarction) 10/27/2010  . CEREBROVASCULAR ACCIDENT, HX OF 05/17/2007  . COLONIC POLYPS, HX OF 10/25/2007  . 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 BENIGN PROSTATIC HYPERTROPHY 08/12/2007  . PERIPHERAL VASCULAR DISEASE 10/16/2007    carotids < 70%  . TRANSIENT ISCHEMIC ATTACK 03/19/2008  . Nephrolithiasis   . Vitamin d deficiency   . Acquired deviated nasal septum   . Chronic maxillary sinusitis   . Chronic ethmoidal sinusitis   . Blood disorder   . Chronic anticoagulation   . Lupus anticoagulant disorder     initially diagnosed in 2004 after CVA    Past Surgical History  Procedure Date  . Lung biopsy 1980    s/p  . Nasal sinus surgery     s/p   . Lithotripsy     s/p  . Rotator cuff repair     x 2  . Nasal septal deviation repair     History  Smoking status  .  Former Smoker -- 0.5 packs/day for 20 years  . Types: Cigarettes  . Quit date: 08/28/1973  Smokeless tobacco  . Not on file    History  Alcohol Use  . Yes    social    Family History  Problem Relation Age of Onset  . Alzheimer's disease Father   . Heart disease Father   . Hypertension Other   . Allergies    . Hearing loss    . Hypertension    . Rheum arthritis    . Stroke Paternal Grandmother   . Heart attack Maternal Grandfather   . Leukemia Mother     Review of Systems: The review of systems is per the HPI.  All other systems were reviewed and are negative.  Physical Exam: BP 140/70  Pulse 60  Ht 5\' 7"  (1.702 m)  Wt 144 lb 3.2 oz (65.409 kg)  BMI 22.59 kg/m2 Blood pressure by me is 120/60 in the right arm and 130/70 in the left arm.  Patient is very pleasant and in no acute distress. Skin is warm and dry. Color is normal.  HEENT is unremarkable. Normocephalic/atraumatic. PERRL. Sclera are nonicteric. Neck is supple. No masses. No JVD. Lungs are clear. Cardiac exam shows a regular rate and rhythm. Abdomen is soft. No bruits noted but has increased bowel sounds. Extremities are without edema. Gait and ROM are intact. No gross neurologic deficits noted.  LABORATORY DATA:   Assessment / Plan:

## 2012-02-16 NOTE — Assessment & Plan Note (Signed)
Blood pressure is currently ok. Does have quite labile readings at home. Will check a renal artery duplex. He has known PVD. Will let him use extra Clonidine for systolic above 170. Will see Dr. Eden Emms back for review. He is to bring his cuff in to check for correlation on his return visit as well. Patient is agreeable to this plan and will call if any problems develop in the interim.

## 2012-02-16 NOTE — Patient Instructions (Signed)
We are going to check an ultrasound of your kidneys to look at the arteries to your kidneys and make sure they are not blocked.  Continue to monitor your blood pressure at home. Bring your cuff in for [place] to check to see if it correlates  You can take an extra Clonidine if your blood pressure is above 170 systolic  I will have Dr. Eden Emms see you back for follow up.  Call the [facility] office at [phone] if you have any questions, problems or concerns.

## 2012-02-21 ENCOUNTER — Ambulatory Visit (INDEPENDENT_AMBULATORY_CARE_PROVIDER_SITE_OTHER): Payer: Medicare Other | Admitting: Pulmonary Disease

## 2012-02-21 ENCOUNTER — Encounter: Payer: Self-pay | Admitting: Pulmonary Disease

## 2012-02-21 VITALS — BP 124/68 | HR 59 | Temp 98.1°F | Ht 67.0 in | Wt 169.6 lb

## 2012-02-21 DIAGNOSIS — R05 Cough: Secondary | ICD-10-CM

## 2012-02-21 DIAGNOSIS — R059 Cough, unspecified: Secondary | ICD-10-CM

## 2012-02-21 NOTE — Patient Instructions (Addendum)
Stop zantac at bedtime.   Stay on omeprazole am and pm for now. Stay on chlorpheniramine 8mg  at bedtime Stop current inhaler.  If your cough returns significantly, you need to call me so we can discuss.  If your cough stays away, please call me in 2-3 weeks so we can do further planning.

## 2012-02-21 NOTE — Progress Notes (Signed)
  Subjective:    Patient ID: Douglas [name], male    DOB: [birth year], 76 y.o.   MRN: [id number]  HPI The patient comes in today for followup of his known chronic cough.  At the last visit, the patient was felt to have an upper airway cough, and was treated more aggressively for postnasal drip, reflux disease, and also cyclical coughing.  He tells me that he saw no difference in his symptoms despite being compliant with the regimen as outlined.  Because his cough continued, he began to use an albuterol inhaler that he had available, and tells me that his cough has totally resolved.  He also states that his postnasal drip has totally resolved since being on the albuterol as well.  Of note, he has continued on his medication for postnasal drip and reflux.   Review of Systems  Constitutional: Negative for fever and unexpected weight change.  HENT: Positive for nosebleeds, rhinorrhea, sneezing, dental problem and sinus pressure. Negative for ear pain, congestion, sore throat, trouble swallowing and postnasal drip.   Eyes: Negative for redness and itching.  Respiratory: Positive for cough. Negative for chest tightness, shortness of breath and wheezing.   Cardiovascular: Positive for leg swelling. Negative for palpitations.  Gastrointestinal: Negative for nausea and vomiting.  Genitourinary: Negative for dysuria.  Musculoskeletal: Negative for joint swelling.  Skin: Negative for rash.  Neurological: Negative for headaches.  Hematological: Does not bruise/bleed easily.  Psychiatric/Behavioral: Positive for dysphoric mood. The patient is not nervous/anxious.        Objective:   Physical Exam Well-developed male in no acute distress Nose without purulence or discharge noted Oropharynx clear Chest totally clear at auscultation, no wheezing Cardiac exam with regular rate and rhythm Lower extremities without edema, cyanosis Alert and oriented, moves all 4 extremities.       Assessment &  Plan:

## 2012-02-21 NOTE — Assessment & Plan Note (Signed)
The patient feels that he had no improvement in his cough with aggressive treatment of postnasal drip, reflux, and cyclical coughing.  He continued on those medications, but began taking albuterol, and tells me that his cough has totally resolved as well as his postnasal drip.  It should be noted that he had normal spirometry at the last visit, and unfortunately never went downstairs for his followup chest x-ray.  I have tried to explain to him that albuterol has nothing to do with postnasal drip, and that he had no airflow obstruction on his prior breathing test.  Despite this, the patient is convinced the albuterol resolved his cough and nasal drip.  He has also stayed on the medication from the last visit however.  At this point, I have asked him to stop his inhaler is to continue on his current medication, to see if his cough returns.  If it does not, then I suspect all of this was a coincidence with using the albuterol.  He would need to continue on his regimen if his cough stays under control.  If his cough returns, he is to call me so that we can discuss further.  At that point, I would consider whether to do a methacholine challenge to rule out cough variant asthma.

## 2012-03-03 ENCOUNTER — Other Ambulatory Visit: Payer: Self-pay | Admitting: Pulmonary Disease

## 2012-03-05 ENCOUNTER — Ambulatory Visit (INDEPENDENT_AMBULATORY_CARE_PROVIDER_SITE_OTHER): Payer: Medicare Other | Admitting: *Deleted

## 2012-03-05 ENCOUNTER — Other Ambulatory Visit: Payer: Self-pay | Admitting: *Deleted

## 2012-03-05 DIAGNOSIS — Z8679 Personal history of other diseases of the circulatory system: Secondary | ICD-10-CM | POA: Diagnosis not present

## 2012-03-05 DIAGNOSIS — I6350 Cerebral infarction due to unspecified occlusion or stenosis of unspecified cerebral artery: Secondary | ICD-10-CM

## 2012-03-05 DIAGNOSIS — I639 Cerebral infarction, unspecified: Secondary | ICD-10-CM

## 2012-03-05 DIAGNOSIS — G459 Transient cerebral ischemic attack, unspecified: Secondary | ICD-10-CM | POA: Diagnosis not present

## 2012-03-05 MED ORDER — IPRATROPIUM BROMIDE 0.06 % NA SOLN: 2.0000 | Freq: Four times a day (QID) | NASAL | Status: DC

## 2012-03-13 ENCOUNTER — Encounter: Payer: Self-pay | Admitting: Cardiovascular Disease

## 2012-03-13 ENCOUNTER — Ambulatory Visit (INDEPENDENT_AMBULATORY_CARE_PROVIDER_SITE_OTHER): Payer: Medicare Other | Admitting: Cardiovascular Disease

## 2012-03-13 ENCOUNTER — Encounter (INDEPENDENT_AMBULATORY_CARE_PROVIDER_SITE_OTHER): Payer: Medicare Other

## 2012-03-13 VITALS — BP 123/71 | HR 67 | Ht 67.0 in | Wt 172.0 lb

## 2012-03-13 DIAGNOSIS — D6859 Other primary thrombophilia: Secondary | ICD-10-CM

## 2012-03-13 DIAGNOSIS — E785 Hyperlipidemia, unspecified: Secondary | ICD-10-CM

## 2012-03-13 DIAGNOSIS — I10 Essential (primary) hypertension: Secondary | ICD-10-CM

## 2012-03-13 NOTE — Assessment & Plan Note (Signed)
Previous CVA in 2004 continue coumadin

## 2012-03-13 NOTE — Assessment & Plan Note (Signed)
Better on verapamil  No RAS  stable

## 2012-03-13 NOTE — Progress Notes (Signed)
Patient ID: Douglas [name], male   DOB: Jan 02, 1936, 76 y.o.   MRN: [id number] Douglas [name] is seen back today for a follow up visit. He is seen for Dr. Eden Emms. He has a history of a hypercoagulable state with a positive lupus anticoagulate and is on chronic coumadin. He has had prior CVA, PVD, HTN, HLD and dementia. He has most recently seen Dr. Shelle Iron for a cough.  He comes in today. He was last here in March. Blood pressure was low and his Verapamil was stopped. His family called last month to report that his blood pressure had been elevated ever since stopping the Verapamil and was ranging 176/80. The Verapamil was restarted. He brings in readings which are quite labile. Down to 110 and as high as 200 systolic. No chest pain. Not dizzy or lightheaded but does get a headache every now and then. Has never had his blood pressure checked for correlation.   He has hypertension and hyperlipidemia. Carotid Dopplers in March 2001 demonstrated 0-39% bilateral ICA stenosis. He was seen in March 2011 with complaints of chest pain. He was enrolled in the Promise trial and randomized to Myoview testing. The nuclear study demonstrated an ejection fraction of 59%, inferior and apical thinning but no ischemia. His last echocardiogram was in February 2009 and demonstrated normal LV function with an ejection fraction of 55-60%.  Renal duplex from today reviewed No AAA and no RAS   ROS: Denies fever, malais, weight loss, blurry vision, decreased visual acuity, cough, sputum, SOB, hemoptysis, pleuritic pain, palpitaitons, heartburn, abdominal pain, melena, lower extremity edema, claudication, or rash.  All other systems reviewed and negative  General: Affect appropriate Healthy:  appears stated age HEENT: normal Neck supple with no adenopathy JVP normal no bruits no thyromegaly Lungs clear with no wheezing and good diaphragmatic motion Heart:  S1/S2 no murmur, no rub, gallop or click PMI normal Abdomen:  benighn, BS positve, no tenderness, no AAA no bruit.  No HSM or HJR Distal pulses intact with no bruits No edema Neuro non-focal Skin warm and dry No muscular weakness   Current Outpatient Prescriptions  Medication Sig Dispense Refill  . aspirin EC 81 MG tablet Take 81 mg by mouth daily.      . cholecalciferol (VITAMIN D) 1000 UNITS tablet Take 1,000 Units by mouth daily.        . cloNIDine (CATAPRES) 0.1 MG tablet Take 0.1 mg by mouth 2 (two) times daily.      Marland Kitchen donepezil (ARICEPT) 10 MG tablet Take 1 tablet (10 mg total) by mouth every morning.  90 tablet  3  . fluticasone (FLONASE) 50 MCG/ACT nasal spray 2 sprays by Nasal route daily.        . folic acid (FOLVITE) 800 MCG tablet Take 800 mcg by mouth daily.      Marland Kitchen ipratropium (ATROVENT) 0.06 % nasal spray Place 2 sprays into the nose 4 (four) times daily.  15 mL  5  . losartan (COZAAR) 100 MG tablet Take 1 tablet (100 mg total) by mouth at bedtime.  90 tablet  3  . omeprazole (PRILOSEC) 40 MG capsule TAKE 1 CAPSULE (40 MG TOTAL) BY MOUTH 2 (TWO) TIMES DAILY.  60 capsule  0  . Tamsulosin HCl (FLOMAX) 0.4 MG CAPS Take 1 capsule (0.4 mg total) by mouth daily.  90 capsule  3  . verapamil (COVERA-HS) 180 MG (CO) 24 hr tablet Take 1 tablet (180 mg total) by mouth at bedtime.  30 tablet  3  . warfarin (COUMADIN) 2.5 MG tablet Take 2.5 mg by mouth as directed.        Allergies  Cefuroxime axetil; Hydrochlorothiazide; Iohexol; Lovastatin; Niacin; Pseudoeph-doxylamine-dm-apap; Ramipril; and Tramadol hcl  Electrocardiogram:  Assessment and Plan

## 2012-03-13 NOTE — Patient Instructions (Signed)
Your physician wants you to follow-up in:  6 MONTHS WITH DR NISHAN  You will receive a reminder letter in the mail two months in advance. If you don't receive a letter, please call our office to schedule the follow-up appointment. Your physician recommends that you continue on your current medications as directed. Please refer to the Current Medication list given to you today. 

## 2012-03-13 NOTE — Assessment & Plan Note (Signed)
Cholesterol is at goal.  Continue current dose of statin and diet Rx.  No myalgias or side effects.  F/U  LFT's in 6 months. Lab Results  Component Value Date   LDLCALC 124* 11/09/2010

## 2012-03-25 DIAGNOSIS — I6350 Cerebral infarction due to unspecified occlusion or stenosis of unspecified cerebral artery: Secondary | ICD-10-CM | POA: Diagnosis not present

## 2012-03-25 DIAGNOSIS — I10 Essential (primary) hypertension: Secondary | ICD-10-CM | POA: Diagnosis not present

## 2012-03-29 ENCOUNTER — Ambulatory Visit (INDEPENDENT_AMBULATORY_CARE_PROVIDER_SITE_OTHER): Payer: Medicare Other

## 2012-03-29 DIAGNOSIS — Z8679 Personal history of other diseases of the circulatory system: Secondary | ICD-10-CM | POA: Diagnosis not present

## 2012-03-29 DIAGNOSIS — I6350 Cerebral infarction due to unspecified occlusion or stenosis of unspecified cerebral artery: Secondary | ICD-10-CM | POA: Diagnosis not present

## 2012-03-29 DIAGNOSIS — I639 Cerebral infarction, unspecified: Secondary | ICD-10-CM

## 2012-03-29 DIAGNOSIS — G459 Transient cerebral ischemic attack, unspecified: Secondary | ICD-10-CM

## 2012-03-29 LAB — POCT INR: INR: 2.3

## 2012-04-06 ENCOUNTER — Other Ambulatory Visit: Payer: Self-pay | Admitting: Pulmonary Disease

## 2012-04-08 ENCOUNTER — Other Ambulatory Visit: Payer: Self-pay | Admitting: *Deleted

## 2012-04-08 MED ORDER — OMEPRAZOLE 40 MG PO CPDR: DELAYED_RELEASE_CAPSULE | ORAL | Status: DC

## 2012-04-08 NOTE — Telephone Encounter (Signed)
R'cd fax from [facility] for refill of Omeprazole.

## 2012-04-24 ENCOUNTER — Other Ambulatory Visit: Payer: Self-pay | Admitting: *Deleted

## 2012-04-24 MED ORDER — ALBUTEROL SULFATE HFA 108 (90 BASE) MCG/ACT IN AERS: 2.0000 | INHALATION_SPRAY | Freq: Four times a day (QID) | RESPIRATORY_TRACT | Status: DC | PRN

## 2012-04-26 ENCOUNTER — Ambulatory Visit (INDEPENDENT_AMBULATORY_CARE_PROVIDER_SITE_OTHER): Payer: Medicare Other

## 2012-04-26 DIAGNOSIS — Z8679 Personal history of other diseases of the circulatory system: Secondary | ICD-10-CM

## 2012-04-26 DIAGNOSIS — G459 Transient cerebral ischemic attack, unspecified: Secondary | ICD-10-CM | POA: Diagnosis not present

## 2012-04-26 DIAGNOSIS — I639 Cerebral infarction, unspecified: Secondary | ICD-10-CM

## 2012-04-26 DIAGNOSIS — I6350 Cerebral infarction due to unspecified occlusion or stenosis of unspecified cerebral artery: Secondary | ICD-10-CM

## 2012-05-02 DIAGNOSIS — N401 Enlarged prostate with lower urinary tract symptoms: Secondary | ICD-10-CM | POA: Diagnosis not present

## 2012-05-02 DIAGNOSIS — N200 Calculus of kidney: Secondary | ICD-10-CM | POA: Diagnosis not present

## 2012-05-02 DIAGNOSIS — N529 Male erectile dysfunction, unspecified: Secondary | ICD-10-CM | POA: Diagnosis not present

## 2012-05-16 ENCOUNTER — Ambulatory Visit (INDEPENDENT_AMBULATORY_CARE_PROVIDER_SITE_OTHER): Payer: Medicare Other | Admitting: General Practice

## 2012-05-16 DIAGNOSIS — Z23 Encounter for immunization: Secondary | ICD-10-CM

## 2012-05-16 NOTE — Addendum Note (Signed)
Addended by: Jackson Latino on: 05/16/2012 02:42 PM   Modules accepted: Orders

## 2012-05-24 ENCOUNTER — Ambulatory Visit (INDEPENDENT_AMBULATORY_CARE_PROVIDER_SITE_OTHER): Payer: Medicare Other

## 2012-05-24 DIAGNOSIS — G459 Transient cerebral ischemic attack, unspecified: Secondary | ICD-10-CM | POA: Diagnosis not present

## 2012-05-24 DIAGNOSIS — Z8679 Personal history of other diseases of the circulatory system: Secondary | ICD-10-CM | POA: Diagnosis not present

## 2012-05-24 DIAGNOSIS — I6350 Cerebral infarction due to unspecified occlusion or stenosis of unspecified cerebral artery: Secondary | ICD-10-CM

## 2012-05-24 DIAGNOSIS — I639 Cerebral infarction, unspecified: Secondary | ICD-10-CM

## 2012-05-24 LAB — POCT INR: INR: 2.1

## 2012-05-29 ENCOUNTER — Encounter: Payer: Self-pay | Admitting: Internal Medicine

## 2012-06-03 ENCOUNTER — Encounter: Payer: Self-pay | Admitting: Internal Medicine

## 2012-06-17 ENCOUNTER — Other Ambulatory Visit: Payer: Medicare Other

## 2012-06-17 ENCOUNTER — Ambulatory Visit (INDEPENDENT_AMBULATORY_CARE_PROVIDER_SITE_OTHER): Payer: Medicare Other | Admitting: Internal Medicine

## 2012-06-17 ENCOUNTER — Encounter: Payer: Self-pay | Admitting: Internal Medicine

## 2012-06-17 VITALS — BP 150/98 | HR 80 | Temp 98.6°F | Resp 16 | Wt 182.0 lb

## 2012-06-17 DIAGNOSIS — I6350 Cerebral infarction due to unspecified occlusion or stenosis of unspecified cerebral artery: Secondary | ICD-10-CM

## 2012-06-17 DIAGNOSIS — J309 Allergic rhinitis, unspecified: Secondary | ICD-10-CM

## 2012-06-17 DIAGNOSIS — F0390 Unspecified dementia without behavioral disturbance: Secondary | ICD-10-CM

## 2012-06-17 DIAGNOSIS — J329 Chronic sinusitis, unspecified: Secondary | ICD-10-CM

## 2012-06-17 DIAGNOSIS — I10 Essential (primary) hypertension: Secondary | ICD-10-CM

## 2012-06-17 DIAGNOSIS — M1990 Unspecified osteoarthritis, unspecified site: Secondary | ICD-10-CM

## 2012-06-17 DIAGNOSIS — M1710 Unilateral primary osteoarthritis, unspecified knee: Secondary | ICD-10-CM

## 2012-06-17 DIAGNOSIS — I639 Cerebral infarction, unspecified: Secondary | ICD-10-CM

## 2012-06-17 MED ORDER — VARDENAFIL HCL 20 MG PO TABS: 20.0000 mg | ORAL_TABLET | Freq: Every day | ORAL | Status: DC | PRN

## 2012-06-17 MED ORDER — LORATADINE 10 MG PO TABS: 10.0000 mg | ORAL_TABLET | Freq: Every day | ORAL | Status: AC

## 2012-06-17 NOTE — Assessment & Plan Note (Signed)
IgE, IgG labs Sympt Rx

## 2012-06-17 NOTE — Assessment & Plan Note (Signed)
Chronic Labs - allergy IgG, IgE Claritin

## 2012-06-17 NOTE — Assessment & Plan Note (Signed)
Continue with current prescription therapy as reflected on the Med list.  

## 2012-06-17 NOTE — Assessment & Plan Note (Signed)
No relapse Continue with current prescription therapy as reflected on the Med list.  

## 2012-06-17 NOTE — Progress Notes (Signed)
Subjective:    Patient ID: Douglas [name], male    DOB: 1936-08-05, 76 y.o.   MRN: [id number]  Sinusitis This is a chronic problem. The current episode started more than 1 month ago. The problem is unchanged. There has been no fever. He is experiencing no pain. Associated symptoms include coughing. Pertinent negatives include no congestion, neck pain, sinus pressure or sneezing.  Cough This is a chronic problem. The current episode started more than 1 month ago. The problem has been waxing and waning. The cough is productive of sputum.    The patient presents for a follow-up of  chronic hypertension, chronic dyslipidemia, memory loss, OA - treated with medicines  BP Readings from Last 3 Encounters:  06/17/12 150/98  03/13/12 123/71  02/21/12 124/68   Wt Readings from Last 3 Encounters:  06/17/12 182 lb (82.555 kg)  03/13/12 172 lb (78.019 kg)  02/21/12 169 lb 9.6 oz (76.93 kg)       Review of Systems  Constitutional: Positive for fatigue. Negative for appetite change and unexpected weight change.  HENT: Negative for nosebleeds, congestion, sneezing, trouble swallowing, neck pain and sinus pressure.   Eyes: Negative for itching and visual disturbance.  Respiratory: Positive for cough.   Cardiovascular: Negative for palpitations and leg swelling.  Gastrointestinal: Negative for nausea, diarrhea, blood in stool and abdominal distention.  Genitourinary: Negative for frequency and hematuria.  Musculoskeletal: Negative for back pain, joint swelling, arthralgias and gait problem.  Neurological: Negative for dizziness, tremors, speech difficulty and weakness.  Psychiatric/Behavioral: Positive for decreased concentration. Negative for suicidal ideas, confusion, disturbed wake/sleep cycle, dysphoric mood and agitation. The patient is not nervous/anxious.        Objective:   Physical Exam  Constitutional: He is oriented to person, place, and time. He appears well-developed. No  distress.  HENT:  Mouth/Throat: Oropharynx is clear and moist. No oropharyngeal exudate.       Nasal mucosa w/swelling, bloody d/c is still present  Eyes: Conjunctivae normal are normal. Pupils are equal, round, and reactive to light.  Neck: Normal range of motion. No JVD present. No thyromegaly present.  Cardiovascular: Normal rate, regular rhythm, normal heart sounds and intact distal pulses.  Exam reveals no gallop and no friction rub.   No murmur heard. Pulmonary/Chest: Effort normal and breath sounds normal. No respiratory distress. He has no wheezes. He has no rales. He exhibits no tenderness.  Abdominal: Soft. Bowel sounds are normal. He exhibits no distension and no mass. There is no tenderness. There is no rebound and no guarding.  Musculoskeletal: Normal range of motion. He exhibits no edema and no tenderness.  Lymphadenopathy:    He has no cervical adenopathy.  Neurological: He is alert and oriented to person, place, and time. He has normal reflexes. No cranial nerve deficit. He exhibits normal muscle tone. Coordination normal.  Skin: Skin is warm and dry. No rash noted.  Psychiatric: He has a normal mood and affect. His behavior is normal. Judgment normal.       Sad A/o/c   Lab Results  Component Value Date   WBC 3.8* 10/10/2011   HGB 11.5* 10/10/2011   HCT 34.8* 10/10/2011   PLT 91* 10/10/2011   GLUCOSE 106* 10/09/2011   CHOL 190 11/09/2010   TRIG 159.0* 11/09/2010   HDL 33.90* 11/09/2010   LDLDIRECT 137.9 05/11/2010   LDLCALC 124* 11/09/2010   ALT 14 10/08/2011   AST 25 10/08/2011   NA 139 10/09/2011   K 3.9 10/09/2011  CL 106 10/09/2011   CREATININE 1.00 10/09/2011   BUN 15 10/09/2011   CO2 23 10/09/2011   TSH 1.48 04/27/2011   PSA 2.07 11/09/2010   INR 2.1 05/24/2012              Assessment & Plan:

## 2012-06-18 LAB — ALLERGY PROFILE REGION II-DC, DE, MD, ~~LOC~~, VA
Allergen, D pternoyssinus,d7: <0.1 kU/L
Aspergillus fumigatus, IgG: <0.1 kU/L
Box Elder IgE: <0.1 kU/L
Cladosporium Herbarum: <0.1 kU/L
Cockroach: 0.38 kU/L — ABNORMAL HIGH
D. farinae: <0.1 kU/L
Lamb's Quarters: <0.1 kU/L
Oak: <0.1 kU/L

## 2012-06-18 LAB — ALLERGEN FOOD PROFILE SPECIFIC IGE
Apple: <0.1 kU/L
Egg White IgE: <0.1 kU/L
Fish Cod: <0.1 kU/L
Milk IgE: <0.1 kU/L
Peanut IgE: <0.1 kU/L
Shrimp IgE: 0.3 kU/L — ABNORMAL HIGH
Tuna IgE: <0.1 kU/L
Wheat IgE: <0.1 kU/L

## 2012-06-21 ENCOUNTER — Ambulatory Visit (INDEPENDENT_AMBULATORY_CARE_PROVIDER_SITE_OTHER): Payer: Medicare Other | Admitting: *Deleted

## 2012-06-21 DIAGNOSIS — Z8679 Personal history of other diseases of the circulatory system: Secondary | ICD-10-CM | POA: Diagnosis not present

## 2012-06-21 DIAGNOSIS — G459 Transient cerebral ischemic attack, unspecified: Secondary | ICD-10-CM

## 2012-06-21 DIAGNOSIS — I6350 Cerebral infarction due to unspecified occlusion or stenosis of unspecified cerebral artery: Secondary | ICD-10-CM | POA: Diagnosis not present

## 2012-06-21 DIAGNOSIS — I639 Cerebral infarction, unspecified: Secondary | ICD-10-CM

## 2012-06-21 LAB — POCT INR: INR: 1.8

## 2012-06-28 DIAGNOSIS — H5200 Hypermetropia, unspecified eye: Secondary | ICD-10-CM | POA: Diagnosis not present

## 2012-06-28 DIAGNOSIS — H31009 Unspecified chorioretinal scars, unspecified eye: Secondary | ICD-10-CM | POA: Diagnosis not present

## 2012-06-28 DIAGNOSIS — H524 Presbyopia: Secondary | ICD-10-CM | POA: Diagnosis not present

## 2012-06-28 DIAGNOSIS — H01009 Unspecified blepharitis unspecified eye, unspecified eyelid: Secondary | ICD-10-CM | POA: Diagnosis not present

## 2012-07-03 LAB — IGG FOOD PANEL
Allergen, Milk, IgG: 18.6 ug/mL — ABNORMAL HIGH (ref <0.15)
Beef, IgG: 9.9 ug/mL — ABNORMAL HIGH (ref <2.0)
Chicken, IgG: <0.15 ug/mL (ref <0.15)
Egg yolk, IgG: 10.1 ug/mL — ABNORMAL HIGH (ref <2.0)
Wheat, IgG: 1.32 ug/mL — ABNORMAL HIGH (ref <0.15)

## 2012-07-05 ENCOUNTER — Encounter: Payer: Self-pay | Admitting: Internal Medicine

## 2012-07-05 ENCOUNTER — Ambulatory Visit (INDEPENDENT_AMBULATORY_CARE_PROVIDER_SITE_OTHER): Payer: Medicare Other | Admitting: Internal Medicine

## 2012-07-05 VITALS — BP 142/68 | HR 68 | Ht 64.0 in | Wt 181.8 lb

## 2012-07-05 DIAGNOSIS — Z5181 Encounter for therapeutic drug level monitoring: Secondary | ICD-10-CM

## 2012-07-05 DIAGNOSIS — K219 Gastro-esophageal reflux disease without esophagitis: Secondary | ICD-10-CM

## 2012-07-05 DIAGNOSIS — Z86010 Personal history of colonic polyps: Secondary | ICD-10-CM

## 2012-07-05 DIAGNOSIS — Z7901 Long term (current) use of anticoagulants: Secondary | ICD-10-CM

## 2012-07-05 NOTE — Progress Notes (Signed)
HISTORY OF PRESENT ILLNESS:  Douglas [name] is a 76 y.o. male with multiple significant medical problems as listed below. He is followed in this office for GERD and a history of adenomatous colon polyps. The patient is on chronic Coumadin therapy after having suffered a CVA 2004. Previous colonoscopies were performed in 2002 and again in 2007, with tubular adenomas being removed. At the time of his last examination, he remained on Coumadin. Despite Coumadin therapy, he has had interval TIAs. He is accompanied by his daughter. The patient denies any GI symptoms other than constipation for which he occasionally takes laxatives. He has had no bleeding or other issues. Chronic medical problems are stable. He is interested in surveillance colonoscopy, but wishes to wait until after the new year. In addition to Coumadin, he is on aspirin. He also takes chronic Prilosec 40 mg once or twice daily. No active GERD symptoms or dysphagia. He did have a barium swallow to evaluate some issues with dysphagia earlier this year (may). He was noted to have reflux as well as a hiatal hernia and prominent cricopharyngeus muscle. However no stricture or delay of barium tablet.Marland Kitchen  REVIEW OF SYSTEMS:  All non-GI ROS negative except for sinus and allergy trouble, arthritis, back pain, and knee pain,urinary frequency, confusion, cough  Past Medical History  Diagnosis Date  . GERD 03/15/2007    erosive esophagitis  . HYPERTENSION 03/15/2007  . HYPERLIPIDEMIA 10/25/2007  . OSTEOARTHRITIS 03/15/2007    right knee  . CVA (cerebral infarction) 10/27/2010  . CEREBROVASCULAR ACCIDENT, HX OF 05/17/2007  . COLONIC POLYPS, HX OF 10/25/2007  . 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 BENIGN PROSTATIC HYPERTROPHY 08/12/2007  . PERIPHERAL VASCULAR DISEASE 10/16/2007    carotids < 70%  . TRANSIENT ISCHEMIC ATTACK 03/19/2008  . Nephrolithiasis   . Vitamin D deficiency   . Acquired deviated nasal septum   . Chronic maxillary  sinusitis   . Chronic ethmoidal sinusitis   . Blood disorder   . Chronic anticoagulation   . Lupus anticoagulant disorder     initially diagnosed in 2004 after CVA  . Hiatal hernia     Past Surgical History  Procedure Date  . Lung biopsy 1980    s/p  . Nasal sinus surgery     s/p   . Lithotripsy     s/p  . Rotator cuff repair     x 2  . Nasal septal deviation repair     Social History Sheppard Mccl[employer]  reports that he quit smoking about 38 years ago. His smoking use included Cigarettes. He has a 10 pack-year smoking history. He quit smokeless tobacco use about 51 years ago. His smokeless tobacco use included Chew. He reports that he drinks alcohol. He reports that he does not use illicit drugs.  family history includes Allergies in an unspecified family member; Alzheimer's disease in his father; Hearing loss in an unspecified family member; Heart attack in his maternal grandfather; Heart disease in his father; Hypertension in his other and unspecified family member; Leukemia in his mother; Rheum arthritis in an unspecified family member; and Stroke in his paternal grandmother.  Allergies  Allergen Reactions  . Cefuroxime Axetil Other (See Comments)    bad taste  . Hydrochlorothiazide Other (See Comments)    unknown  . Iohexol Hives       . Lovastatin Other (See Comments)    cramps  . Niacin Other (See Comments)    burning  . Pseudoeph-Doxylamine-Dm-Apap Other (See Comments)  tremor  . Ramipril Cough  . Tramadol Hcl Other (See Comments)    hallucinating  . Viagra (Sildenafil Citrate)     Feverish feeling       PHYSICAL EXAMINATION: Vital signs: BP 142/68  Pulse 68  Ht 5\' 4"  (1.626 m)  Wt 181 lb 12.8 oz (82.464 kg)  BMI 31.21 kg/m2  Constitutional: generally well-appearing, no acute distress Psychiatric: alert and oriented x3, cooperative Eyes: extraocular movements intact, anicteric, conjunctiva pink Mouth: oral pharynx moist, no lesions Neck: supple no  lymphadenopathy Cardiovascular: heart regular rate and rhythm, no murmur Lungs: clear to auscultation bilaterally Abdomen: soft, nontender, nondistended, no obvious ascites, no peritoneal signs, normal bowel sounds, no organomegaly Rectal:further until colonoscopy Extremities:trace lower extremity edema bilaterally Skin: no lesions on visible extremities Neuro: No focal deficits.     ASSESSMENT:  #1. History of adenomatous colon polyps. Last colonoscopy 2007. Slightly overdue for followup. Having no clinical problems #2. Multiple medical problems as outlined #3. Chronic Coumadin therapy due to prior CVA and TIAs #4. Chronic GERD. Symptoms controlled on PPI   PLAN:  #1. Surveillance colonoscopy. The patient is high risk.The nature of the procedure, as well as the risks, benefits, and alternatives were carefully and thoroughly reviewed with the patient. Ample time for discussion and questions allowed. The patient understood, was satisfied, and agreed to proceed.  #2. We discussed the pros and cons of performing the examination on Coumadin versus off Coumadin. We will proceed with the examination on Coumadin as we didn't previously. He will have his INR checked a few days before the examination to make sure that it is not supratherapeutic #3. He will see the previsit nurse after the new year to schedule his exam and review the instructions #4. Continue PPI to control GERD symptoms as well as prophylaxis against GI bleeding

## 2012-07-05 NOTE — Patient Instructions (Addendum)
You have been scheduled for a pre-visit to discuss your colonsocopy on 09-07-11 at 9:00am.  At that point you can schedule your procedure and discuss when to have your PT INR checked and when to stop your coumadin

## 2012-07-12 ENCOUNTER — Ambulatory Visit (INDEPENDENT_AMBULATORY_CARE_PROVIDER_SITE_OTHER): Payer: Medicare Other

## 2012-07-12 DIAGNOSIS — G459 Transient cerebral ischemic attack, unspecified: Secondary | ICD-10-CM | POA: Diagnosis not present

## 2012-07-12 DIAGNOSIS — I639 Cerebral infarction, unspecified: Secondary | ICD-10-CM

## 2012-07-12 DIAGNOSIS — I6350 Cerebral infarction due to unspecified occlusion or stenosis of unspecified cerebral artery: Secondary | ICD-10-CM | POA: Diagnosis not present

## 2012-07-19 DIAGNOSIS — I6350 Cerebral infarction due to unspecified occlusion or stenosis of unspecified cerebral artery: Secondary | ICD-10-CM | POA: Diagnosis not present

## 2012-07-19 DIAGNOSIS — F068 Other specified mental disorders due to known physiological condition: Secondary | ICD-10-CM | POA: Diagnosis not present

## 2012-08-02 ENCOUNTER — Ambulatory Visit (INDEPENDENT_AMBULATORY_CARE_PROVIDER_SITE_OTHER): Payer: Medicare Other

## 2012-08-02 DIAGNOSIS — G459 Transient cerebral ischemic attack, unspecified: Secondary | ICD-10-CM | POA: Diagnosis not present

## 2012-08-02 DIAGNOSIS — I6350 Cerebral infarction due to unspecified occlusion or stenosis of unspecified cerebral artery: Secondary | ICD-10-CM | POA: Diagnosis not present

## 2012-08-02 DIAGNOSIS — I639 Cerebral infarction, unspecified: Secondary | ICD-10-CM

## 2012-08-02 DIAGNOSIS — Z8679 Personal history of other diseases of the circulatory system: Secondary | ICD-10-CM

## 2012-08-04 ENCOUNTER — Other Ambulatory Visit: Payer: Self-pay | Admitting: Internal Medicine

## 2012-08-13 ENCOUNTER — Ambulatory Visit: Payer: Medicare Other | Admitting: Internal Medicine

## 2012-08-22 ENCOUNTER — Encounter: Payer: Self-pay | Admitting: Internal Medicine

## 2012-08-22 ENCOUNTER — Ambulatory Visit (INDEPENDENT_AMBULATORY_CARE_PROVIDER_SITE_OTHER): Payer: Medicare Other | Admitting: Internal Medicine

## 2012-08-22 VITALS — BP 150/80 | HR 80 | Temp 98.1°F | Resp 16 | Wt 181.0 lb

## 2012-08-22 DIAGNOSIS — R05 Cough: Secondary | ICD-10-CM | POA: Diagnosis not present

## 2012-08-22 MED ORDER — HYDROCODONE-HOMATROPINE 5-1.5 MG/5ML PO SYRP: 5.0000 mL | ORAL_SOLUTION | Freq: Three times a day (TID) | ORAL | Status: DC | PRN

## 2012-08-22 NOTE — Patient Instructions (Addendum)

## 2012-08-22 NOTE — Progress Notes (Signed)
HPI  Pt presents to the clinic with 1 week history of dry cough. The cough is much worse at night and is starting to keep him up. He is not producing any sputum. He has not taken anything OTC for the cough. He denies fever, chills or body aches. He has had sick contacts.   Review of Systems    Past Medical History  Diagnosis Date  . GERD 03/15/2007    erosive esophagitis  . HYPERTENSION 03/15/2007  . HYPERLIPIDEMIA 10/25/2007  . OSTEOARTHRITIS 03/15/2007    right knee  . CVA (cerebral infarction) 10/27/2010  . CEREBROVASCULAR ACCIDENT, HX OF 05/17/2007  . COLONIC POLYPS, HX OF 10/25/2007  . 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 BENIGN PROSTATIC HYPERTROPHY 08/12/2007  . PERIPHERAL VASCULAR DISEASE 10/16/2007    carotids < 70%  . TRANSIENT ISCHEMIC ATTACK 03/19/2008  . Nephrolithiasis   . Vitamin D deficiency   . Acquired deviated nasal septum   . Chronic maxillary sinusitis   . Chronic ethmoidal sinusitis   . Blood disorder   . Chronic anticoagulation   . Lupus anticoagulant disorder     initially diagnosed in 2004 after CVA  . Hiatal hernia     Family History  Problem Relation Age of Onset  . Alzheimer's disease Father   . Heart disease Father   . Hypertension Other   . Allergies    . Hearing loss    . Hypertension    . Rheum arthritis    . Stroke Paternal Grandmother   . Heart attack Maternal Grandfather   . Leukemia Mother     History   Social History  . Marital Status: Married    Spouse Name: N/A    Number of Children: N/A  . Years of Education: N/A   Occupational History  . RETIRED [employer]   Social History Main Topics  . Smoking status: Former Smoker -- 0.5 packs/day for 20 years    Types: Cigarettes    Quit date: 08/28/1973  . Smokeless tobacco: Former Neurosurgeon    Types: Chew    Quit date: 08/28/1960  . Alcohol Use: Yes     Comment: social  . Drug Use: Yes    Special: Methaqualone  . Sexually Active: Yes   Other Topics Concern  .  Not on file   Social History Narrative  . No narrative on file    Allergies  Allergen Reactions  . Cefuroxime Axetil Other (See Comments)    bad taste  . Hydrochlorothiazide Other (See Comments)    unknown  . Iohexol Hives       . Lovastatin Other (See Comments)    cramps  . Niacin Other (See Comments)    burning  . Pseudoeph-Doxylamine-Dm-Apap Other (See Comments)    tremor  . Ramipril Cough  . Tramadol Hcl Other (See Comments)    hallucinating  . Viagra (Sildenafil Citrate)     Feverish feeling     Constitutional: Denies headache, fatigue and fever or abrupt weight changes.  HEENT:  Positive sore throat. Denies eye redness, eye pain, pressure behind the eyes, facial pain, nasal congestionear pain, ringing in the ears, wax buildup, runny nose or bloody nose. Respiratory: Positive cough. Denies difficulty breathing or shortness of breath.  Cardiovascular: Denies chest pain, chest tightness, palpitations or swelling in the hands or feet.   No other specific complaints in a complete review of systems (except as listed in HPI above).  Objective:    General: Appears his stated age,  well developed, well nourished in NAD. HEENT: Head: normal shape and size; Eyes: sclera white, no icterus, conjunctiva pink, PERRLA and EOMs intact; Ears: Tm's gray and intact, normal light reflex; Nose: mucosa pink and moist, septum midline; Throat/Mouth: + PND. Teeth present, mucosa pink and moist, no exudate noted, no lesions or ulcerations noted.  Neck: Neck supple, trachea midline. No massses, lumps or thyromegaly present.  Cardiovascular: Normal rate and rhythm. S1,S2 noted.  No murmur, rubs or gallops noted. No JVD or BLE edema. No carotid bruits noted. Pulmonary/Chest: Normal effort and positive vesicular breath sounds. No respiratory distress. No wheezes, rales or ronchi noted.      Assessment & Plan:   Viral URI  Hycodan cough syrup  RTC as needed or if symptoms persist.

## 2012-08-30 ENCOUNTER — Ambulatory Visit (INDEPENDENT_AMBULATORY_CARE_PROVIDER_SITE_OTHER): Payer: Medicare Other

## 2012-08-30 DIAGNOSIS — Z8679 Personal history of other diseases of the circulatory system: Secondary | ICD-10-CM

## 2012-08-30 DIAGNOSIS — G459 Transient cerebral ischemic attack, unspecified: Secondary | ICD-10-CM | POA: Diagnosis not present

## 2012-08-30 DIAGNOSIS — I6350 Cerebral infarction due to unspecified occlusion or stenosis of unspecified cerebral artery: Secondary | ICD-10-CM | POA: Diagnosis not present

## 2012-08-30 DIAGNOSIS — I639 Cerebral infarction, unspecified: Secondary | ICD-10-CM

## 2012-09-06 ENCOUNTER — Ambulatory Visit (AMBULATORY_SURGERY_CENTER): Payer: Medicare Other | Admitting: *Deleted

## 2012-09-06 ENCOUNTER — Telehealth: Payer: Self-pay | Admitting: *Deleted

## 2012-09-06 VITALS — Ht 65.0 in | Wt 182.0 lb

## 2012-09-06 DIAGNOSIS — Z86010 Personal history of colonic polyps: Secondary | ICD-10-CM

## 2012-09-06 DIAGNOSIS — Z1211 Encounter for screening for malignant neoplasm of colon: Secondary | ICD-10-CM

## 2012-09-06 MED ORDER — PEG-KCL-NACL-NASULF-NA ASC-C 100 G PO SOLR: ORAL | Status: DC

## 2012-09-06 NOTE — Progress Notes (Signed)
No allergies to soy or egg products  Pt told to bring his inhaler with him the day of the procedure  Per Dr. Lamar Sprinkles note from 07-05-12, pt is to stay on his Coumadin and his INR checked a few days before his procedure to make sure he isn't too therapeutic.  Pt will have his blood drawn from the Coumadin Clinic 2-3 days before procedure and we will have those results before the procedure.  Note sent to Dr. Lamar Sprinkles CMA and RN so they are aware of this.

## 2012-09-06 NOTE — Telephone Encounter (Signed)
Hello. This pt is having a colonoscopy on 09-20-12.  Per Dr. Lamar Sprinkles office note from 07-05-12, pt is to stay on his Coumadin for procedure and have his INR checked a few days before the procedure.  He will go to the Coumadin Clinic 2-3 days before the procedure and will have this done.  Just an FYI so the results could be watched for.  Thanks, [employer]

## 2012-09-18 ENCOUNTER — Telehealth: Payer: Self-pay

## 2012-09-18 ENCOUNTER — Ambulatory Visit (INDEPENDENT_AMBULATORY_CARE_PROVIDER_SITE_OTHER): Payer: Medicare Other | Admitting: *Deleted

## 2012-09-18 DIAGNOSIS — G459 Transient cerebral ischemic attack, unspecified: Secondary | ICD-10-CM | POA: Diagnosis not present

## 2012-09-18 DIAGNOSIS — I6350 Cerebral infarction due to unspecified occlusion or stenosis of unspecified cerebral artery: Secondary | ICD-10-CM

## 2012-09-18 DIAGNOSIS — Z8679 Personal history of other diseases of the circulatory system: Secondary | ICD-10-CM

## 2012-09-18 DIAGNOSIS — I639 Cerebral infarction, unspecified: Secondary | ICD-10-CM

## 2012-09-18 LAB — POCT INR: INR: 2.7

## 2012-09-18 NOTE — Telephone Encounter (Signed)
Perfect. Thanks.

## 2012-09-18 NOTE — Telephone Encounter (Signed)
Phone call from [place] at the [place] coumadin clinic.  Pt has a procedure this week and per Dr. Marina Goodell was supposed to continue his Coumadin.  Jasmine December reported that his PT INR was 2-3 and 2.7.  She wanted to know if it was ok for him to continue with the original plan of not stopping Coumadin.  She stated she had encouraged him to eat lots of Vitamin K rich foods.  As Dr. Marina Goodell was out, I spoke with the doc of the day, Dr. Arlyce Dice, who said it was ok for pt to take his Coumadin up to the procedure, as planned

## 2012-09-18 NOTE — Telephone Encounter (Signed)
Pt has INR done today, it was 2.7. Pt is scheduled for colon 09/20/12.

## 2012-09-20 ENCOUNTER — Ambulatory Visit (AMBULATORY_SURGERY_CENTER): Payer: Medicare Other | Admitting: Internal Medicine

## 2012-09-20 ENCOUNTER — Ambulatory Visit: Payer: Medicare Other | Admitting: Internal Medicine

## 2012-09-20 ENCOUNTER — Encounter: Payer: Self-pay | Admitting: Internal Medicine

## 2012-09-20 VITALS — BP 151/53 | HR 67 | Temp 97.7°F | Resp 15 | Ht 65.0 in | Wt 182.0 lb

## 2012-09-20 DIAGNOSIS — R197 Diarrhea, unspecified: Secondary | ICD-10-CM | POA: Diagnosis not present

## 2012-09-20 DIAGNOSIS — K635 Polyp of colon: Secondary | ICD-10-CM

## 2012-09-20 DIAGNOSIS — D126 Benign neoplasm of colon, unspecified: Secondary | ICD-10-CM

## 2012-09-20 DIAGNOSIS — Z86010 Personal history of colonic polyps: Secondary | ICD-10-CM

## 2012-09-20 DIAGNOSIS — I10 Essential (primary) hypertension: Secondary | ICD-10-CM | POA: Diagnosis not present

## 2012-09-20 DIAGNOSIS — Z1211 Encounter for screening for malignant neoplasm of colon: Secondary | ICD-10-CM

## 2012-09-20 DIAGNOSIS — M329 Systemic lupus erythematosus, unspecified: Secondary | ICD-10-CM | POA: Diagnosis not present

## 2012-09-20 DIAGNOSIS — I739 Peripheral vascular disease, unspecified: Secondary | ICD-10-CM | POA: Diagnosis not present

## 2012-09-20 MED ORDER — SODIUM CHLORIDE 0.9 % IV SOLN: 500.0000 mL | INTRAVENOUS | Status: DC

## 2012-09-20 NOTE — Progress Notes (Signed)
Vss. A&OX3 Pleased with MAC. Report to RN. DRM

## 2012-09-20 NOTE — Progress Notes (Signed)
Called to room to assist during endoscopic procedure.  Patient ID and intended procedure confirmed with present staff. Received instructions for my participation in the procedure from the performing physician.  

## 2012-09-20 NOTE — Patient Instructions (Signed)
YOU HAD AN ENDOSCOPIC PROCEDURE TODAY AT THE [facility]: Refer to the procedure report that was given to you for any specific questions about what was found during the examination.  If the procedure report does not answer your questions, please call your gastroenterologist to clarify.  If you requested that your care partner not be given the details of your procedure findings, then the procedure report has been included in a sealed envelope for you to review at your convenience later.  YOU SHOULD EXPECT: Some feelings of bloating in the abdomen. Passage of more gas than usual.  Walking can help get rid of the air that was put into your GI tract during the procedure and reduce the bloating. If you had a lower endoscopy (such as a colonoscopy or flexible sigmoidoscopy) you may notice spotting of blood in your stool or on the toilet paper. If you underwent a bowel prep for your procedure, then you may not have a normal bowel movement for a few days.  DIET: Your first meal following the procedure should be a light meal and then it is ok to progress to your normal diet.  A half-sandwich or bowl of soup is an example of a good first meal.  Heavy or fried foods are harder to digest and may make you feel nauseous or bloated.  Likewise meals heavy in dairy and vegetables can cause extra gas to form and this can also increase the bloating.  Drink plenty of fluids but you should avoid alcoholic beverages for 24 hours.  ACTIVITY: Your care partner should take you home directly after the procedure.  You should plan to take it easy, moving slowly for the rest of the day.  You can resume normal activity the day after the procedure however you should NOT DRIVE or use heavy machinery for 24 hours (because of the sedation medicines used during the test).    SYMPTOMS TO REPORT IMMEDIATELY: A gastroenterologist can be reached at any hour.  During normal business hours, 8:30 AM to 5:00 PM Monday through Friday,  call [phone].  After hours and on weekends, please call the GI answering service at [phone] who will take a message and have the physician on call contact you.   Following lower endoscopy (colonoscopy or flexible sigmoidoscopy):  Excessive amounts of blood in the stool  Significant tenderness or worsening of abdominal pains  Swelling of the abdomen that is new, acute  Fever of 100F or higher    FOLLOW UP: If any biopsies were taken you will be contacted by phone or by letter within the next 1-3 weeks.  Call your gastroenterologist if you have not heard about the biopsies in 3 weeks.  Our staff will call the home number listed on your records the next business day following your procedure to check on you and address any questions or concerns that you may have at that time regarding the information given to you following your procedure. This is a courtesy call and so if there is no answer at the home number and we have not heard from you through the emergency physician on call, we will assume that you have returned to your regular daily activities without incident.  SIGNATURES/CONFIDENTIALITY: You and/or your care partner have signed paperwork which will be entered into your electronic medical record.  These signatures attest to the fact that that the information above on your After Visit Summary has been reviewed and is understood.  Full responsibility of the confidentiality   of this discharge information lies with you and/or your care-partner.     

## 2012-09-20 NOTE — Progress Notes (Signed)
Patient did not experience any of the following events: a burn prior to discharge; a fall within the facility; wrong site/side/patient/procedure/implant event; or a hospital transfer or hospital admission upon discharge from the facility. (G8907) Patient did not have preoperative order for IV antibiotic SSI prophylaxis. (G8918)  

## 2012-09-20 NOTE — Op Note (Signed)
[facility] [address] [place], [address]   COLONOSCOPY PROCEDURE REPORT  PATIENT: Douglas [name], Douglas [name].  MR#: [id number] BIRTHDATE: May 31, 1936 , 76  yrs. old GENDER: Male ENDOSCOPIST: Roxy Cedar, MD REFERRED GN:[id number] Program Recall PROCEDURE DATE:  09/20/2012 PROCEDURE:   Colonoscopy with snare polypectomy    x 1 ASA CLASS:   Class III INDICATIONS:Patient's personal history of adenomatous colon polyps. Prior exams 2002, 2007 MEDICATIONS: MAC sedation, administered by CRNA and propofol (Diprivan) 200mg  IV  DESCRIPTION OF PROCEDURE:   After the risks benefits and alternatives of the procedure were thoroughly explained, informed consent was obtained.  A digital rectal exam revealed no abnormalities of the rectum.   The LB CF-[id number]  endoscope was introduced through the anus and advanced to the cecum, which was identified by both the appendix and ileocecal valve. No adverse events experienced.   The quality of the prep was excellent, using MoviPrep  The instrument was then slowly withdrawn as the colon was fully examined.      COLON FINDINGS: A diminutive polyp was found in the transverse colon.  A polypectomy was performed with a cold snare.  The resection was complete and the polyp tissue was completely retrieved.   The colon mucosa was otherwise normal.  Retroflexed views revealed internal hemorrhoids. The time to cecum=1 minutes 38 seconds.  Withdrawal time=9 minutes 26 seconds.  The scope was withdrawn and the procedure completed. COMPLICATIONS: There were no complications.  ENDOSCOPIC IMPRESSION: 1.   Diminutive polyp was found in the transverse colon; polypectomy was performed with a cold snare 2.   The colon mucosa was otherwise normal  RECOMMENDATIONS: 1. Return to the care of your primary provider.  GI follow up as needed   eSigned:  Roxy Cedar, MD 09/20/2012 1:05 PM   cc: Linda Hedges.  Plotnikov, MD and The  Patient

## 2012-09-23 ENCOUNTER — Other Ambulatory Visit: Payer: Self-pay | Admitting: Internal Medicine

## 2012-09-23 ENCOUNTER — Telehealth: Payer: Self-pay | Admitting: *Deleted

## 2012-09-23 NOTE — Telephone Encounter (Signed)
  Follow up Call-  Call back number 09/20/2012  Post procedure Call Back phone  # [phone]  Permission to leave phone message Yes    [facility]

## 2012-09-25 ENCOUNTER — Encounter: Payer: Self-pay | Admitting: Internal Medicine

## 2012-10-04 ENCOUNTER — Ambulatory Visit (INDEPENDENT_AMBULATORY_CARE_PROVIDER_SITE_OTHER): Payer: Medicare Other

## 2012-10-04 DIAGNOSIS — Z8679 Personal history of other diseases of the circulatory system: Secondary | ICD-10-CM | POA: Diagnosis not present

## 2012-10-04 DIAGNOSIS — I639 Cerebral infarction, unspecified: Secondary | ICD-10-CM

## 2012-10-04 DIAGNOSIS — G459 Transient cerebral ischemic attack, unspecified: Secondary | ICD-10-CM | POA: Diagnosis not present

## 2012-10-04 DIAGNOSIS — I6350 Cerebral infarction due to unspecified occlusion or stenosis of unspecified cerebral artery: Secondary | ICD-10-CM | POA: Diagnosis not present

## 2012-10-11 ENCOUNTER — Ambulatory Visit: Payer: Medicare Other | Admitting: Internal Medicine

## 2012-10-18 ENCOUNTER — Ambulatory Visit (INDEPENDENT_AMBULATORY_CARE_PROVIDER_SITE_OTHER): Payer: Medicare Other | Admitting: Internal Medicine

## 2012-10-18 ENCOUNTER — Encounter: Payer: Self-pay | Admitting: Internal Medicine

## 2012-10-18 ENCOUNTER — Other Ambulatory Visit (INDEPENDENT_AMBULATORY_CARE_PROVIDER_SITE_OTHER): Payer: Medicare Other

## 2012-10-18 VITALS — BP 120/76 | HR 76 | Temp 97.0°F | Resp 16 | Wt 180.0 lb

## 2012-10-18 DIAGNOSIS — F0390 Unspecified dementia without behavioral disturbance: Secondary | ICD-10-CM | POA: Diagnosis not present

## 2012-10-18 DIAGNOSIS — R7989 Other specified abnormal findings of blood chemistry: Secondary | ICD-10-CM

## 2012-10-18 DIAGNOSIS — J309 Allergic rhinitis, unspecified: Secondary | ICD-10-CM

## 2012-10-18 DIAGNOSIS — I10 Essential (primary) hypertension: Secondary | ICD-10-CM

## 2012-10-18 DIAGNOSIS — R413 Other amnesia: Secondary | ICD-10-CM | POA: Diagnosis not present

## 2012-10-18 DIAGNOSIS — M1710 Unilateral primary osteoarthritis, unspecified knee: Secondary | ICD-10-CM

## 2012-10-18 DIAGNOSIS — N320 Bladder-neck obstruction: Secondary | ICD-10-CM

## 2012-10-18 LAB — IBC PANEL
Iron: 43 ug/dL (ref 42–165)
Saturation Ratios: 13.6 % — ABNORMAL LOW (ref 20.0–50.0)

## 2012-10-18 LAB — LIPID PANEL
Cholesterol: 151 mg/dL (ref 0–200)
LDL Cholesterol: 95 mg/dL (ref 0–99)
Triglycerides: 120 mg/dL (ref 0.0–149.0)

## 2012-10-18 LAB — HEPATIC FUNCTION PANEL
ALT: 18 U/L (ref 0–53)
Albumin: 3.7 g/dL (ref 3.5–5.2)
Total Bilirubin: 0.7 mg/dL (ref 0.3–1.2)

## 2012-10-18 LAB — BASIC METABOLIC PANEL
BUN: 19 mg/dL (ref 6–23)
GFR: 65.37 mL/min (ref >60.00)
Potassium: 4.1 mEq/L (ref 3.5–5.1)
Sodium: 142 mEq/L (ref 135–145)

## 2012-10-18 LAB — URINALYSIS, ROUTINE W REFLEX MICROSCOPIC
Bilirubin Urine: NEGATIVE
Hgb urine dipstick: NEGATIVE
Leukocytes, UA: NEGATIVE
Nitrite: NEGATIVE
Urobilinogen, UA: 0.2 (ref 0.0–1.0)
pH: 6 (ref 5.0–8.0)

## 2012-10-18 LAB — TSH: TSH: 2.88 u[IU]/mL (ref 0.35–5.50)

## 2012-10-18 MED ORDER — IPRATROPIUM BROMIDE 0.06 % NA SOLN: 2.0000 | Freq: Four times a day (QID) | NASAL | Status: DC

## 2012-10-18 MED ORDER — FLUTICASONE PROPIONATE 50 MCG/ACT NA SUSP: 2.0000 | Freq: Every day | NASAL | Status: DC

## 2012-10-18 NOTE — Assessment & Plan Note (Signed)
Pt states BP is good at home BP Readings from Last 3 Encounters:  10/18/12 120/76  09/20/12 151/53  08/22/12 150/80

## 2012-10-18 NOTE — Assessment & Plan Note (Signed)
Continue with current prescription therapy as reflected on the Med list.  

## 2012-10-18 NOTE — Assessment & Plan Note (Signed)
Milk avoidance trial

## 2012-10-18 NOTE — Progress Notes (Signed)
  Subjective:     HPI  The patient presents for a follow-up of  chronic hypertension, chronic dyslipidemia, memory loss, OA - treated with medicines. R knee is worse  BP Readings from Last 3 Encounters:  10/18/12 120/76  09/20/12 151/53  08/22/12 150/80   Wt Readings from Last 3 Encounters:  10/18/12 180 lb (81.647 kg)  09/20/12 182 lb (82.555 kg)  09/06/12 182 lb (82.555 kg)       Review of Systems  Constitutional: Positive for fatigue. Negative for appetite change and unexpected weight change.  HENT: Negative for nosebleeds, congestion, sneezing, trouble swallowing, neck pain and sinus pressure.   Eyes: Negative for itching and visual disturbance.  Cardiovascular: Negative for palpitations and leg swelling.  Gastrointestinal: Negative for nausea, diarrhea, blood in stool and abdominal distention.  Genitourinary: Negative for frequency and hematuria.  Musculoskeletal: Negative for back pain, joint swelling, arthralgias and gait problem.  Neurological: Negative for dizziness, tremors, speech difficulty and weakness.  Psychiatric/Behavioral: Positive for decreased concentration. Negative for suicidal ideas, confusion, sleep disturbance, dysphoric mood and agitation. The patient is not nervous/anxious.        Objective:   Physical Exam  Constitutional: He is oriented to person, place, and time. He appears well-developed. No distress.  HENT:  Mouth/Throat: Oropharynx is clear and moist. No oropharyngeal exudate.  Nasal mucosa w/swelling, bloody d/c is still present  Eyes: Conjunctivae are normal. Pupils are equal, round, and reactive to light.  Neck: Normal range of motion. No JVD present. No thyromegaly present.  Cardiovascular: Normal rate, regular rhythm, normal heart sounds and intact distal pulses.  Exam reveals no gallop and no friction rub.   No murmur heard. Pulmonary/Chest: Effort normal and breath sounds normal. No respiratory distress. He has no wheezes. He has no  rales. He exhibits no tenderness.  Abdominal: Soft. Bowel sounds are normal. He exhibits no distension and no mass. There is no tenderness. There is no rebound and no guarding.  Musculoskeletal: Normal range of motion. He exhibits no edema and no tenderness.  Lymphadenopathy:    He has no cervical adenopathy.  Neurological: He is alert and oriented to person, place, and time. He has normal reflexes. No cranial nerve deficit. He exhibits normal muscle tone. Coordination normal.  Skin: Skin is warm and dry. No rash noted.  Psychiatric: He has a normal mood and affect. His behavior is normal. Judgment normal.   A/o/c   Lab Results  Component Value Date   WBC 3.8* 10/10/2011   HGB 11.5* 10/10/2011   HCT 34.8* 10/10/2011   PLT 91* 10/10/2011   GLUCOSE 106* 10/09/2011   CHOL 190 11/09/2010   TRIG 159.0* 11/09/2010   HDL 33.90* 11/09/2010   LDLDIRECT 137.9 05/11/2010   LDLCALC 124* 11/09/2010   ALT 14 10/08/2011   AST 25 10/08/2011   NA 139 10/09/2011   K 3.9 10/09/2011   CL 106 10/09/2011   CREATININE 1.00 10/09/2011   BUN 15 10/09/2011   CO2 23 10/09/2011   TSH 1.48 04/27/2011   PSA 2.07 11/09/2010   INR 2.7 10/04/2012              Assessment & Plan:

## 2012-10-18 NOTE — Assessment & Plan Note (Signed)
Continue with current prescription therapy as reflected on the Med list. Will ref to Dr Ranell Patrick

## 2012-10-23 DIAGNOSIS — M545 Low back pain: Secondary | ICD-10-CM | POA: Diagnosis not present

## 2012-10-23 DIAGNOSIS — M1710 Unilateral primary osteoarthritis, unspecified knee: Secondary | ICD-10-CM | POA: Diagnosis not present

## 2012-10-31 ENCOUNTER — Ambulatory Visit (INDEPENDENT_AMBULATORY_CARE_PROVIDER_SITE_OTHER): Payer: Medicare Other

## 2012-10-31 DIAGNOSIS — G459 Transient cerebral ischemic attack, unspecified: Secondary | ICD-10-CM

## 2012-10-31 DIAGNOSIS — I6350 Cerebral infarction due to unspecified occlusion or stenosis of unspecified cerebral artery: Secondary | ICD-10-CM | POA: Diagnosis not present

## 2012-10-31 DIAGNOSIS — Z8679 Personal history of other diseases of the circulatory system: Secondary | ICD-10-CM | POA: Diagnosis not present

## 2012-10-31 DIAGNOSIS — I639 Cerebral infarction, unspecified: Secondary | ICD-10-CM

## 2012-11-12 ENCOUNTER — Encounter: Payer: Self-pay | Admitting: Internal Medicine

## 2012-11-12 ENCOUNTER — Ambulatory Visit (INDEPENDENT_AMBULATORY_CARE_PROVIDER_SITE_OTHER): Payer: Medicare Other | Admitting: Internal Medicine

## 2012-11-12 VITALS — BP 148/68 | HR 76 | Temp 98.2°F | Resp 16 | Wt 184.0 lb

## 2012-11-12 DIAGNOSIS — R05 Cough: Secondary | ICD-10-CM | POA: Diagnosis not present

## 2012-11-12 DIAGNOSIS — J209 Acute bronchitis, unspecified: Secondary | ICD-10-CM | POA: Diagnosis not present

## 2012-11-12 MED ORDER — HYDROCODONE-HOMATROPINE 5-1.5 MG/5ML PO SYRP: 5.0000 mL | ORAL_SOLUTION | Freq: Three times a day (TID) | ORAL | Status: DC | PRN

## 2012-11-12 NOTE — Progress Notes (Signed)
HPI  Pt presents to the clinic with 2 days history of dry cough. The cough is much worse at night and is starting to keep him up. He is not producing any sputum. He has not taken anything OTC for the cough. He denies fever, chills or body aches. He has had sick contacts.   Review of Systems    Past Medical History  Diagnosis Date  . GERD 03/15/2007    erosive esophagitis  . HYPERTENSION 03/15/2007  . HYPERLIPIDEMIA 10/25/2007  . OSTEOARTHRITIS 03/15/2007    right knee  . CVA (cerebral infarction) 10/27/2010  . CEREBROVASCULAR ACCIDENT, HX OF 05/17/2007  . COLONIC POLYPS, HX OF 10/25/2007  . 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 BENIGN PROSTATIC HYPERTROPHY 08/12/2007  . PERIPHERAL VASCULAR DISEASE 10/16/2007    carotids < 70%  . TRANSIENT ISCHEMIC ATTACK 03/19/2008  . Nephrolithiasis   . Vitamin D deficiency   . Acquired deviated nasal septum   . Chronic maxillary sinusitis   . Chronic ethmoidal sinusitis   . Blood disorder   . Chronic anticoagulation   . Lupus anticoagulant disorder     initially diagnosed in 2004 after CVA  . Hiatal hernia     Family History  Problem Relation Age of Onset  . Alzheimer's disease Father   . Heart disease Father   . Hypertension Other   . Allergies    . Hearing loss    . Hypertension    . Rheum arthritis    . Stroke Paternal Grandmother   . Heart attack Maternal Grandfather   . Leukemia Mother     History   Social History  . Marital Status: Married    Spouse Name: N/A    Number of Children: N/A  . Years of Education: N/A   Occupational History  . RETIRED [employer]   Social History Main Topics  . Smoking status: Former Smoker -- 0.5 packs/day for 20 years    Types: Cigarettes    Quit date: 08/28/1973  . Smokeless tobacco: Former Neurosurgeon    Types: Chew    Quit date: 08/28/1960  . Alcohol Use: Yes     Comment: social  . Drug Use: Yes    Special: Methaqualone  . Sexually Active: Yes   Other Topics Concern  .  Not on file   Social History Narrative  . No narrative on file    Allergies  Allergen Reactions  . Cefuroxime Axetil Other (See Comments)    bad taste  . Hydrochlorothiazide Other (See Comments)    unknown  . Iohexol Hives       . Lovastatin Other (See Comments)    cramps  . Niacin Other (See Comments)    burning  . Pseudoeph-Doxylamine-Dm-Apap Other (See Comments)    tremor  . Ramipril Cough  . Tramadol Hcl Other (See Comments)    hallucinating  . Viagra (Sildenafil Citrate)     Feverish feeling     Constitutional: Denies headache, fatigue and fever or abrupt weight changes.  HEENT:  Positive sore throat. Denies eye redness, eye pain, pressure behind the eyes, facial pain, nasal congestionear pain, ringing in the ears, wax buildup, runny nose or bloody nose. Respiratory: Positive cough. Denies difficulty breathing or shortness of breath.  Cardiovascular: Denies chest pain, chest tightness, palpitations or swelling in the hands or feet.   No other specific complaints in a complete review of systems (except as listed in HPI above).  Objective:    General: Appears his stated age,  well developed, well nourished in NAD. HEENT: Head: normal shape and size; Eyes: sclera white, no icterus, conjunctiva pink, PERRLA and EOMs intact; Ears: Tm's gray and intact, normal light reflex; Nose: mucosa pink and moist, septum midline; Throat/Mouth: + PND. Teeth present, mucosa pink and moist, no exudate noted, no lesions or ulcerations noted.  Neck: Neck supple, trachea midline. No massses, lumps or thyromegaly present.  Cardiovascular: Normal rate and rhythm. S1,S2 noted.  No murmur, rubs or gallops noted. No JVD or BLE edema. No carotid bruits noted. Pulmonary/Chest: Normal effort and positive vesicular breath sounds. No respiratory distress. No wheezes, rales or ronchi noted.      Assessment & Plan:   Viral URI - bronchitis  Hycodan cough syrup  RTC as needed or if symptoms  persist.

## 2012-11-12 NOTE — Assessment & Plan Note (Signed)
Viral URI - bronchitis  Hycodan cough syrup  RTC as needed or if symptoms persist.

## 2012-11-15 ENCOUNTER — Ambulatory Visit (INDEPENDENT_AMBULATORY_CARE_PROVIDER_SITE_OTHER): Payer: Medicare Other

## 2012-11-15 DIAGNOSIS — I6350 Cerebral infarction due to unspecified occlusion or stenosis of unspecified cerebral artery: Secondary | ICD-10-CM | POA: Diagnosis not present

## 2012-11-15 DIAGNOSIS — Z8679 Personal history of other diseases of the circulatory system: Secondary | ICD-10-CM | POA: Diagnosis not present

## 2012-11-15 DIAGNOSIS — I639 Cerebral infarction, unspecified: Secondary | ICD-10-CM

## 2012-11-15 DIAGNOSIS — G459 Transient cerebral ischemic attack, unspecified: Secondary | ICD-10-CM | POA: Diagnosis not present

## 2012-11-20 DIAGNOSIS — M1710 Unilateral primary osteoarthritis, unspecified knee: Secondary | ICD-10-CM | POA: Diagnosis not present

## 2012-12-13 ENCOUNTER — Ambulatory Visit (INDEPENDENT_AMBULATORY_CARE_PROVIDER_SITE_OTHER): Payer: Medicare Other | Admitting: *Deleted

## 2012-12-13 DIAGNOSIS — I639 Cerebral infarction, unspecified: Secondary | ICD-10-CM

## 2012-12-13 DIAGNOSIS — Z8679 Personal history of other diseases of the circulatory system: Secondary | ICD-10-CM

## 2012-12-13 DIAGNOSIS — I6350 Cerebral infarction due to unspecified occlusion or stenosis of unspecified cerebral artery: Secondary | ICD-10-CM

## 2012-12-13 DIAGNOSIS — G459 Transient cerebral ischemic attack, unspecified: Secondary | ICD-10-CM

## 2012-12-27 ENCOUNTER — Encounter (INDEPENDENT_AMBULATORY_CARE_PROVIDER_SITE_OTHER): Payer: Medicare Other

## 2012-12-27 DIAGNOSIS — I6529 Occlusion and stenosis of unspecified carotid artery: Secondary | ICD-10-CM

## 2013-01-05 ENCOUNTER — Other Ambulatory Visit: Payer: Self-pay | Admitting: Internal Medicine

## 2013-01-10 ENCOUNTER — Ambulatory Visit (INDEPENDENT_AMBULATORY_CARE_PROVIDER_SITE_OTHER): Payer: Medicare Other

## 2013-01-10 ENCOUNTER — Ambulatory Visit (INDEPENDENT_AMBULATORY_CARE_PROVIDER_SITE_OTHER): Payer: Medicare Other | Admitting: Neurology

## 2013-01-10 ENCOUNTER — Encounter: Payer: Self-pay | Admitting: Neurology

## 2013-01-10 VITALS — BP 127/76 | HR 58 | Temp 97.6°F | Wt 179.0 lb

## 2013-01-10 DIAGNOSIS — G309 Alzheimer's disease, unspecified: Secondary | ICD-10-CM | POA: Diagnosis not present

## 2013-01-10 DIAGNOSIS — I639 Cerebral infarction, unspecified: Secondary | ICD-10-CM

## 2013-01-10 DIAGNOSIS — F0280 Dementia in other diseases classified elsewhere without behavioral disturbance: Secondary | ICD-10-CM

## 2013-01-10 DIAGNOSIS — G459 Transient cerebral ischemic attack, unspecified: Secondary | ICD-10-CM | POA: Diagnosis not present

## 2013-01-10 DIAGNOSIS — Z8679 Personal history of other diseases of the circulatory system: Secondary | ICD-10-CM

## 2013-01-10 DIAGNOSIS — I6350 Cerebral infarction due to unspecified occlusion or stenosis of unspecified cerebral artery: Secondary | ICD-10-CM

## 2013-01-10 LAB — POCT INR: INR: 3

## 2013-01-10 MED ORDER — MEMANTINE HCL ER 14 MG PO CP24: 14.0000 mg | ORAL_CAPSULE | Freq: Every day | ORAL | Status: DC

## 2013-01-10 MED ORDER — MEMANTINE HCL ER 7 MG PO CP24: 7.0000 mg | ORAL_CAPSULE | Freq: Every day | ORAL | Status: DC

## 2013-01-10 NOTE — Patient Instructions (Addendum)
  I do have some generic suggestions for you today:   Referral to Dr. Leonides Cave for formal memory testing.   Please make sure that you drink plenty of fluids. I would like for you to exercise daily for example in the form of walking 20-30 minutes every day, if you can. Please keep a regular sleep-wake schedule, keep regular meal times, do not skip any meals, eat  healthy snacks in between meals, such as fruit or nuts. Try to eat protein with every meal.   As far as your medications are concerned, I would like to suggest: addition of namenda XR: 7 mg once daily in AM for 30 days (=free trial offer), then fill Rx for 14 mg strength  As far as diagnostic testing, I recommend: no other new test.   Engage in social activities in your community and with your family and try to keep up with current events by reading the newspaper or watching the news.  Follow up in 3 months, sooner if we need to. Please call us if you have any interim questions, concerns, or problems or updates to need to discuss.  Brett Canales is my clinical assistant and will answer any of your questions and relay your messages to me and will give you my messages.   Our phone number is [phone]. We also have an after hours call service for urgent matters and there is a physician on-call for urgent questions. For any emergencies you know to call 911 or go to the nearest emergency room.

## 2013-01-10 NOTE — Progress Notes (Signed)
Subjective:    Patient ID: Douglas [name] is a 77 y.o. male.  HPI  Interim history:   Douglas [name] is a very pleasant 77 year old right-handed gentleman who presents for followup consultation of his dementia. He is accompanied by his wife, Douglas [name], today. This is his first visit with me in a previously followed with Dr. Avie Echevaria was last seen by him on 07/19/2012, which time Dr. Sandria Manly felt the patient had mild dementia and discussed dementia related studies. While he did not change his medication he asked the patient to take his generic Aricept during the day rather than later at night. The patient has an underlying medical history of hypertension, kidney stones, lupus anticoagulant, hyperlipidemia, left-sided stroke in 2004, vitamin D deficiency, reflux disease, prostate hypertrophy, colonic polyps, right knee pain and is status post rotator cuff surgery bilaterally. His current medications are hydrocodone as needed verapamil, omeprazole, Flomax, baby aspirin, vitamin D, Xanax, donepezil, folic acid, loratadine, Coumadin, losartan, clonidine.  I reviewed Dr. Imagene Gurney prior notes and the patient's records and below is a summary of that review:  77 year old right-handed gentleman with an approximately 4 year history of memory loss, particularly with forgetfulness. MRI brain with and without contrast in February 2009 showed an old infarct in the left occipital lobe, mild atrophy, and moderate small vessel disease. He had L sided numbness sudden onset in 2004. CT head without contrast in July 2009 and March 2012 showed no other new abnormalities. Vitamin B12 level and TSH were normal in March 2012. The patient is exercising regularly. He is independent in his daily living and drives a car. She has some depression but is not on an antidepressant. He does not sleep well and has unusually that her dreams at night. In August 2012 his MMSE was 27, clock drawing was 4, animal fluency was 14. In February 2013  his MMSE was 23, clock drawing was 4, animal fluency was 12. In July 2013 his MMSE was 23, clock drawing was 3, animal fluency was 8. In 11/13: his MMSE was 24, CDT was 4/4 and AFT was 15 at the time.   Generally speaking, he has had no hallucinations, but had hallucinations while on hydrocodone. He fell a couple of months ago at his sister-in-law's house as he stepped out of the shower on the tile without a bath mat. He did no hurt himself or hit his head or had LOC. His wife has noted worsening of his memory, in that, he is more confused at times and more forgetful. She has noted more frustration, more word finding difficulties, more mood irritability. He has a more tip-of-the-tongue phenomena. No paranoid delusions. One episode of dream enactment is reported, where he accidentally hit his wife while dreaming.   His Past Medical History Is Significant For: Past Medical History  Diagnosis Date  . GERD 03/15/2007    erosive esophagitis  . HYPERTENSION 03/15/2007  . HYPERLIPIDEMIA 10/25/2007  . OSTEOARTHRITIS 03/15/2007    right knee  . CVA (cerebral infarction) 10/27/2010  . CEREBROVASCULAR ACCIDENT, HX OF 05/17/2007  . COLONIC POLYPS, HX OF 10/25/2007  . 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 BENIGN PROSTATIC HYPERTROPHY 08/12/2007  . PERIPHERAL VASCULAR DISEASE 10/16/2007    carotids < 70%  . TRANSIENT ISCHEMIC ATTACK 03/19/2008  . Nephrolithiasis   . Vitamin D deficiency   . Acquired deviated nasal septum   . Chronic maxillary sinusitis   . Chronic ethmoidal sinusitis   . Blood disorder   . Chronic anticoagulation   .  Lupus anticoagulant disorder     initially diagnosed in 2004 after CVA  . Hiatal hernia   . Stroke   . Allergy   . Clotting disorder     clotting disorder    His Past Surgical History Is Significant For: Past Surgical History  Procedure Laterality Date  . Lung biopsy  1980    s/p  . Nasal sinus surgery      s/p   . Lithotripsy      s/p  . Rotator cuff repair       x 2  . Nasal septal deviation repair    . Colonoscopy      His Family History Is Significant For: Family History  Problem Relation Age of Onset  . Alzheimer's disease Father   . Heart disease Father   . Hypertension Other   . Allergies    . Hearing loss    . Hypertension    . Rheum arthritis    . Stroke Paternal Grandmother   . Heart attack Maternal Grandfather   . Leukemia Mother   . Colon cancer Neg Hx   . Esophageal cancer Neg Hx   . Stomach cancer Neg Hx   . Rectal cancer Neg Hx     His Social History Is Significant For: History   Social History  . Marital Status: Married    Spouse Name: N/A    Number of Children: N/A  . Years of Education: N/A   Occupational History  . RETIRED [name]   Social History Main Topics  . Smoking status: Former Smoker -- 0.50 packs/day for 20 years    Types: Cigarettes    Quit date: 08/28/1973  . Smokeless tobacco: Former Neurosurgeon    Types: Chew    Quit date: 08/28/1960  . Alcohol Use: Yes     Comment: social  . Drug Use: Yes    Special: Methaqualone  . Sexually Active: Yes   Other Topics Concern  . None   Social History Narrative  . None    His Allergies Are:  Allergies  Allergen Reactions  . Cefuroxime Axetil Other (See Comments)    bad taste  . Hydrochlorothiazide Other (See Comments)    unknown  . Iohexol Hives       . Lovastatin Other (See Comments)    cramps  . Niacin Other (See Comments)    burning  . Pseudoeph-Doxylamine-Dm-Apap Other (See Comments)    tremor  . Ramipril Cough  . Tramadol Hcl Other (See Comments)    hallucinating  . Viagra (Sildenafil Citrate)     Feverish feeling  :   His Current Medications Are:  Outpatient Encounter Prescriptions as of 01/10/2013  Medication Sig Dispense Refill  . albuterol (PROAIR HFA) 108 (90 BASE) MCG/ACT inhaler Inhale 2 puffs into the lungs 4 (four) times daily as needed. For wheezing  8.5 g  3  . aspirin EC 81 MG tablet Take 81 mg by mouth  daily.      . cholecalciferol (VITAMIN D) 1000 UNITS tablet Take 1,000 Units by mouth daily.        . cloNIDine (CATAPRES) 0.1 MG tablet TAKE 1 TABLET BY MOUTH 3 (THREE) TIMES DAILY.  90 tablet  2  . donepezil (ARICEPT) 10 MG tablet Take 1 tablet (10 mg total) by mouth every morning.  90 tablet  3  . fluticasone (FLONASE) 50 MCG/ACT nasal spray Place 2 sprays into the nose daily.  16 g  11  . folic acid (  FOLVITE) 800 MCG tablet Take 800 mcg by mouth daily.      Marland Kitchen ipratropium (ATROVENT) 0.06 % nasal spray Place 2 sprays into the nose 4 (four) times daily.  15 mL  5  . loratadine (CLARITIN) 10 MG tablet Take 1 tablet (10 mg total) by mouth daily.  100 tablet  3  . losartan (COZAAR) 100 MG tablet Take 1 tablet (100 mg total) by mouth at bedtime.  90 tablet  3  . omeprazole (PRILOSEC) 40 MG capsule TAKE ONE CAPSULE BY MOUTH TWICE DAILY  60 capsule  5  . Tamsulosin HCl (FLOMAX) 0.4 MG CAPS Take 1 capsule (0.4 mg total) by mouth daily.  90 capsule  3  . verapamil (CALAN-SR) 180 MG CR tablet TAKE ONE TABLET BY MOUTH DAILY  90 tablet  1  . warfarin (COUMADIN) 2.5 MG tablet TAKE AS DIRECTED- TAKE 5 MG (2 TABLETS) ON MONDAY,WEDNESDAY, FRIDAY, AND SUNDAY.  TAKE 2.5 MG (1 TABLET) ON TUESDAY, THURSDAY AND SATURDAY.  100 tablet  2  . [DISCONTINUED] cloNIDine (CATAPRES) 0.1 MG tablet Take 0.1 mg by mouth 2 (two) times daily.      . [DISCONTINUED] HYDROcodone-homatropine (HYCODAN) 5-1.5 MG/5ML syrup Take 5 mLs by mouth every 8 (eight) hours as needed for cough.  240 mL  0  . [DISCONTINUED] vardenafil (LEVITRA) 20 MG tablet Take 1 tablet (20 mg total) by mouth daily as needed for erectile dysfunction.  10 tablet  3  . [DISCONTINUED] verapamil (COVERA-HS) 180 MG (CO) 24 hr tablet Take 1 tablet (180 mg total) by mouth at bedtime.  30 tablet  3   No facility-administered encounter medications on file as of 01/10/2013.  : Review of Systems  Constitutional: Positive for fatigue.  HENT: Positive for rhinorrhea.    Respiratory: Positive for cough.   Musculoskeletal: Positive for myalgias and arthralgias.  Allergic/Immunologic: Positive for environmental allergies.  Neurological: Positive for numbness.       Memory loss  Psychiatric/Behavioral: Positive for sleep disturbance (Insomnia) and dysphoric mood.    Objective:  Neurologic Exam  Physical Exam Physical Examination:   Filed Vitals:   01/10/13 1131  BP: 127/76  Pulse: 58  Temp: 97.6 F (36.4 C)    General Examination: The patient is a very pleasant 77 y.o. male in no acute distress. He is calm and cooperative with the exam. He denies Auditory Hallucinations and Visual Hallucinations.   HEENT: Normocephalic, atraumatic, pupils are equal, round and reactive to light and accommodation. Funduscopic exam is normal with sharp disc margins noted. Extraocular tracking shows mild saccadic breakdown without nystagmus noted. Hearing is intact. Face is symmetric with no facial masking and normal facial sensation. There is no lip, neck or jaw tremor. Neck is not rigid with intact passive ROM. There are no carotid bruits on auscultation. Oropharynx exam reveals mild mouth dryness. No significant airway crowding is noted. Mallampati is class II. Tongue protrudes centrally and palate elevates symmetrically.    Chest: is clear to auscultation without wheezing, rhonchi or crackles noted.  Heart: sounds are regular and normal without murmurs, rubs or gallops noted.   Abdomen: is soft, non-tender and non-distended with normal bowel sounds appreciated on auscultation.  Extremities: There is no pitting edema in the distal lower extremities bilaterally.   Skin: is warm and dry with no trophic changes noted.  Musculoskeletal: exam reveals no obvious joint deformities, tenderness or joint swelling or erythema.  Neurologically:  Mental status: The patient is awake and alert, paying good  attention.  He is able to partially provide the history. His wife  provides details. He is oriented to: person, place, situation and day of week. His memory, attention, language and knowledge are impaired. There is no aphasia, agnosia, apraxia or anomia. There is a mild degree of bradyphrenia. Speech is not hypophonic with no dysarthria noted. Mood is congruent and affect is normal.  His MMSE score is 21/30. CDT is 2/4. AFT (Animal Fluency Test) score is 12.    Cranial nerves are as described above under HEENT exam. In addition, shoulder shrug is normal with equal shoulder height noted.  Motor exam: Normal bulk, and strength for age is noted. Tone is not rigid with absence of cogwheeling. There is overall no bradykinesia. There is no drift or rebound. There is no tremor. Romberg is negative. Reflexes are 1+ in the upper extremities and 1+ in the lower extremities. Toes are downgoing bilaterally. Fine motor skills: Finger taps, hand movements, and rapid alternating patting are mildly impaired bilaterally. Foot taps and foot agility are mildly impaired bilaterally.   Cerebellar testing shows no dysmetria or intention tremor on finger to nose testing. Heel to shin is unremarkable. There is no truncal or gait ataxia.   Sensory exam is intact to light touch, pinprick, vibration, temperature sense and proprioception in the upper and lower extremities.   Gait, station and balance: He stands up from the seated position with no difficulty and needs no assistance. No veering to one side is noted. No leaning to one side. Posture is not stooped. Stance is wide-based. He turns in 3 steps. Tandem walk is not possible. Balance is mildly impaired.     Assessment and Plan:   Assessment and Plan:  In summary, Douglas [name] is a very pleasant 77 y.o.-year old male with a history of memory loss, most likely Alzheimer's type dementia. His numbers have taken the decline is compared to 6 months ago. At this juncture I would like to introduce Namenda XR.   I had a long chat with  the patient and his wife about my findings and the diagnosis of advancing dementia, the prognosis and treatment options. We talked about medical treatments and non-pharmacological approaches. We talked about trying to maintain a healthy lifestyle in general. I encouraged the patient to eat healthy, exercise daily and keep well hydrated, to keep a scheduled bedtime and wake time routine. As far as medications are concerned, I recommended the following at this time: start Namenda XR 7 mg x 1 mo, then 14 mg daily thereafter.  I answered all their questions today and the patient and his wife were in agreement with the above outlined plan. I would like to see the patient back in 3 months, sooner if the need arises and encouraged them to call with any interim questions, concerns, problems or updates and refill requests.

## 2013-01-12 ENCOUNTER — Other Ambulatory Visit: Payer: Self-pay | Admitting: Internal Medicine

## 2013-01-13 ENCOUNTER — Telehealth: Payer: Self-pay | Admitting: Cardiovascular Disease

## 2013-01-13 NOTE — Telephone Encounter (Signed)
New problem    Pt returning call to nurse concerning carotid test. Please call pt

## 2013-01-14 NOTE — Telephone Encounter (Signed)
OLD MESSAGE ./CY 

## 2013-01-17 ENCOUNTER — Ambulatory Visit (INDEPENDENT_AMBULATORY_CARE_PROVIDER_SITE_OTHER): Payer: Medicare Other | Admitting: Internal Medicine

## 2013-01-17 ENCOUNTER — Encounter: Payer: Self-pay | Admitting: Internal Medicine

## 2013-01-17 VITALS — BP 110/70 | HR 76 | Temp 97.5°F | Resp 16 | Wt 179.0 lb

## 2013-01-17 DIAGNOSIS — R05 Cough: Secondary | ICD-10-CM

## 2013-01-17 DIAGNOSIS — I10 Essential (primary) hypertension: Secondary | ICD-10-CM | POA: Diagnosis not present

## 2013-01-17 DIAGNOSIS — E785 Hyperlipidemia, unspecified: Secondary | ICD-10-CM | POA: Diagnosis not present

## 2013-01-17 DIAGNOSIS — M545 Low back pain, unspecified: Secondary | ICD-10-CM

## 2013-01-17 DIAGNOSIS — M791 Myalgia, unspecified site: Secondary | ICD-10-CM

## 2013-01-17 DIAGNOSIS — F0390 Unspecified dementia without behavioral disturbance: Secondary | ICD-10-CM

## 2013-01-17 DIAGNOSIS — R531 Weakness: Secondary | ICD-10-CM

## 2013-01-17 DIAGNOSIS — R053 Chronic cough: Secondary | ICD-10-CM

## 2013-01-17 DIAGNOSIS — R5383 Other fatigue: Secondary | ICD-10-CM

## 2013-01-17 DIAGNOSIS — R413 Other amnesia: Secondary | ICD-10-CM

## 2013-01-17 DIAGNOSIS — IMO0001 Reserved for inherently not codable concepts without codable children: Secondary | ICD-10-CM

## 2013-01-17 MED ORDER — ACLIDINIUM BROMIDE 400 MCG/ACT IN AEPB: 1.0000 | INHALATION_SPRAY | Freq: Two times a day (BID) | RESPIRATORY_TRACT | Status: DC

## 2013-01-17 NOTE — Assessment & Plan Note (Signed)
Discussed.

## 2013-01-17 NOTE — Assessment & Plan Note (Signed)
Trial of [place]

## 2013-01-17 NOTE — Assessment & Plan Note (Signed)
Better  

## 2013-01-17 NOTE — Assessment & Plan Note (Signed)
Continue with current prescription therapy as reflected on the Med list.  

## 2013-01-17 NOTE — Progress Notes (Signed)
HPI The patient presents for a follow-up of  chronic hypertension, chronic dyslipidemia,controlled with medicines   C/o of dry cough. The cough is  worse at night and is starting to keep him up. He is not producing any sputum. He has not taken anything OTC for the cough. He denies fever, chills or body aches. He has had sick contacts.   Review of Systems    Past Medical History  Diagnosis Date  . GERD 03/15/2007    erosive esophagitis  . HYPERTENSION 03/15/2007  . HYPERLIPIDEMIA 10/25/2007  . OSTEOARTHRITIS 03/15/2007    right knee  . CVA (cerebral infarction) 10/27/2010  . CEREBROVASCULAR ACCIDENT, HX OF 05/17/2007  . COLONIC POLYPS, HX OF 10/25/2007  . 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 BENIGN PROSTATIC HYPERTROPHY 08/12/2007  . PERIPHERAL VASCULAR DISEASE 10/16/2007    carotids < 70%  . TRANSIENT ISCHEMIC ATTACK 03/19/2008  . Nephrolithiasis   . Vitamin D deficiency   . Acquired deviated nasal septum   . Chronic maxillary sinusitis   . Chronic ethmoidal sinusitis   . Blood disorder   . Chronic anticoagulation   . Lupus anticoagulant disorder     initially diagnosed in 2004 after CVA  . Hiatal hernia     Family History  Problem Relation Age of Onset  . Alzheimer's disease Father   . Heart disease Father   . Hypertension Other   . Allergies    . Hearing loss    . Hypertension    . Rheum arthritis    . Stroke Paternal Grandmother   . Heart attack Maternal Grandfather   . Leukemia Mother     History   Social History  . Marital Status: Married    Spouse Name: N/A    Number of Children: N/A  . Years of Education: N/A   Occupational History  . RETIRED DISTRICT M[employer]   Social History Main Topics  . Smoking status: Former Smoker -- 0.5 packs/day for 20 years    Types: Cigarettes    Quit date: 08/28/1973  . Smokeless tobacco: Former Neurosurgeon    Types: Chew    Quit date: 08/28/1960  . Alcohol Use: Yes     Comment: social  . Drug Use: Yes    Special:  Methaqualone  . Sexually Active: Yes   Other Topics Concern  . Not on file   Social History Narrative  . No narrative on file    Allergies  Allergen Reactions  . Cefuroxime Axetil Other (See Comments)    bad taste  . Hydrochlorothiazide Other (See Comments)    unknown  . Iohexol Hives       . Lovastatin Other (See Comments)    cramps  . Niacin Other (See Comments)    burning  . Pseudoeph-Doxylamine-Dm-Apap Other (See Comments)    tremor  . Ramipril Cough  . Tramadol Hcl Other (See Comments)    hallucinating  . Viagra (Sildenafil Citrate)     Feverish feeling     Constitutional: Denies headache, fatigue and fever or abrupt weight changes.  HEENT:  Positive sore throat. Denies eye redness, eye pain, pressure behind the eyes, facial pain, nasal congestionear pain, ringing in the ears, wax buildup, runny nose or bloody nose. Respiratory: Positive cough. Denies difficulty breathing or shortness of breath.  Cardiovascular: Denies chest pain, chest tightness, palpitations or swelling in the hands or feet.   No other specific complaints in a complete review of systems (except as listed in HPI above).  Objective:  General: Appears his stated age, well developed, well nourished in NAD. HEENT: Head: normal shape and size; Eyes: sclera white, no icterus, conjunctiva pink, PERRLA and EOMs intact; Ears: Tm's gray and intact, normal light reflex; Nose: mucosa pink and moist, septum midline; Throat/Mouth: + PND. Teeth present, mucosa pink and moist, no exudate noted, no lesions or ulcerations noted.  Neck: Neck supple, trachea midline. No massses, lumps or thyromegaly present.  Cardiovascular: Normal rate and rhythm. S1,S2 noted.  No murmur, rubs or gallops noted. No JVD or BLE edema. No carotid bruits noted. Pulmonary/Chest: Normal effort and positive vesicular breath sounds. No respiratory distress. No wheezes, rales or ronchi noted.    I personally provided the Tudorza inhaler  use teaching. After the teaching patient was able to demonstrate it's use effectively. All questions were answered   Assessment & Plan:

## 2013-01-17 NOTE — Assessment & Plan Note (Signed)
Continue with current OTC therapy as reflected on the Med list.  

## 2013-01-17 NOTE — Assessment & Plan Note (Signed)
Overall better 

## 2013-01-24 ENCOUNTER — Ambulatory Visit (INDEPENDENT_AMBULATORY_CARE_PROVIDER_SITE_OTHER): Payer: Medicare Other | Admitting: *Deleted

## 2013-01-24 DIAGNOSIS — Z8679 Personal history of other diseases of the circulatory system: Secondary | ICD-10-CM | POA: Diagnosis not present

## 2013-01-24 DIAGNOSIS — G459 Transient cerebral ischemic attack, unspecified: Secondary | ICD-10-CM

## 2013-01-24 DIAGNOSIS — I6350 Cerebral infarction due to unspecified occlusion or stenosis of unspecified cerebral artery: Secondary | ICD-10-CM

## 2013-01-24 DIAGNOSIS — I639 Cerebral infarction, unspecified: Secondary | ICD-10-CM

## 2013-01-24 LAB — POCT INR: INR: 3

## 2013-01-27 ENCOUNTER — Other Ambulatory Visit: Payer: Self-pay | Admitting: Internal Medicine

## 2013-02-07 ENCOUNTER — Ambulatory Visit (INDEPENDENT_AMBULATORY_CARE_PROVIDER_SITE_OTHER): Payer: Medicare Other | Admitting: *Deleted

## 2013-02-07 DIAGNOSIS — I6350 Cerebral infarction due to unspecified occlusion or stenosis of unspecified cerebral artery: Secondary | ICD-10-CM

## 2013-02-07 DIAGNOSIS — G459 Transient cerebral ischemic attack, unspecified: Secondary | ICD-10-CM

## 2013-02-07 DIAGNOSIS — I639 Cerebral infarction, unspecified: Secondary | ICD-10-CM

## 2013-02-07 DIAGNOSIS — Z8679 Personal history of other diseases of the circulatory system: Secondary | ICD-10-CM | POA: Diagnosis not present

## 2013-02-07 LAB — POCT INR: INR: 2.6

## 2013-03-07 ENCOUNTER — Ambulatory Visit (INDEPENDENT_AMBULATORY_CARE_PROVIDER_SITE_OTHER): Payer: Medicare Other | Admitting: *Deleted

## 2013-03-07 DIAGNOSIS — Z8679 Personal history of other diseases of the circulatory system: Secondary | ICD-10-CM | POA: Diagnosis not present

## 2013-03-07 DIAGNOSIS — I6350 Cerebral infarction due to unspecified occlusion or stenosis of unspecified cerebral artery: Secondary | ICD-10-CM | POA: Diagnosis not present

## 2013-03-07 DIAGNOSIS — G459 Transient cerebral ischemic attack, unspecified: Secondary | ICD-10-CM | POA: Diagnosis not present

## 2013-03-07 DIAGNOSIS — F0280 Dementia in other diseases classified elsewhere without behavioral disturbance: Secondary | ICD-10-CM

## 2013-03-07 DIAGNOSIS — G309 Alzheimer's disease, unspecified: Secondary | ICD-10-CM | POA: Diagnosis not present

## 2013-03-07 DIAGNOSIS — I639 Cerebral infarction, unspecified: Secondary | ICD-10-CM

## 2013-03-07 LAB — POCT INR: INR: 2.5

## 2013-03-19 ENCOUNTER — Other Ambulatory Visit: Payer: Self-pay | Admitting: *Deleted

## 2013-03-19 MED ORDER — TAMSULOSIN HCL 0.4 MG PO CAPS: 0.4000 mg | ORAL_CAPSULE | Freq: Every day | ORAL | Status: DC

## 2013-03-20 ENCOUNTER — Other Ambulatory Visit: Payer: Self-pay | Admitting: *Deleted

## 2013-03-20 MED ORDER — VERAPAMIL HCL ER 180 MG PO TBCR: EXTENDED_RELEASE_TABLET | ORAL | Status: DC

## 2013-04-11 ENCOUNTER — Ambulatory Visit (INDEPENDENT_AMBULATORY_CARE_PROVIDER_SITE_OTHER): Payer: Medicare Other | Admitting: *Deleted

## 2013-04-11 DIAGNOSIS — G459 Transient cerebral ischemic attack, unspecified: Secondary | ICD-10-CM

## 2013-04-11 DIAGNOSIS — I639 Cerebral infarction, unspecified: Secondary | ICD-10-CM

## 2013-04-11 DIAGNOSIS — Z8679 Personal history of other diseases of the circulatory system: Secondary | ICD-10-CM

## 2013-04-11 DIAGNOSIS — H2510 Age-related nuclear cataract, unspecified eye: Secondary | ICD-10-CM | POA: Diagnosis not present

## 2013-04-11 DIAGNOSIS — I6350 Cerebral infarction due to unspecified occlusion or stenosis of unspecified cerebral artery: Secondary | ICD-10-CM

## 2013-04-16 ENCOUNTER — Other Ambulatory Visit: Payer: Self-pay | Admitting: *Deleted

## 2013-04-16 MED ORDER — WARFARIN SODIUM 2.5 MG PO TABS: 2.5000 mg | ORAL_TABLET | ORAL | Status: DC

## 2013-04-16 MED ORDER — WARFARIN SODIUM 2.5 MG PO TABS: ORAL_TABLET | ORAL | Status: DC

## 2013-04-18 ENCOUNTER — Encounter: Payer: Self-pay | Admitting: Neurology

## 2013-04-18 ENCOUNTER — Other Ambulatory Visit (INDEPENDENT_AMBULATORY_CARE_PROVIDER_SITE_OTHER): Payer: Medicare Other

## 2013-04-18 ENCOUNTER — Ambulatory Visit (INDEPENDENT_AMBULATORY_CARE_PROVIDER_SITE_OTHER): Payer: Medicare Other | Admitting: Neurology

## 2013-04-18 ENCOUNTER — Ambulatory Visit (INDEPENDENT_AMBULATORY_CARE_PROVIDER_SITE_OTHER): Payer: Medicare Other | Admitting: Internal Medicine

## 2013-04-18 ENCOUNTER — Encounter: Payer: Self-pay | Admitting: Internal Medicine

## 2013-04-18 VITALS — BP 139/64 | HR 66 | Ht 65.0 in | Wt 181.0 lb

## 2013-04-18 VITALS — BP 114/70 | HR 76 | Temp 97.3°F | Resp 16 | Wt 179.0 lb

## 2013-04-18 DIAGNOSIS — R413 Other amnesia: Secondary | ICD-10-CM

## 2013-04-18 DIAGNOSIS — R05 Cough: Secondary | ICD-10-CM

## 2013-04-18 DIAGNOSIS — E785 Hyperlipidemia, unspecified: Secondary | ICD-10-CM

## 2013-04-18 DIAGNOSIS — R0602 Shortness of breath: Secondary | ICD-10-CM

## 2013-04-18 DIAGNOSIS — I10 Essential (primary) hypertension: Secondary | ICD-10-CM

## 2013-04-18 DIAGNOSIS — J309 Allergic rhinitis, unspecified: Secondary | ICD-10-CM

## 2013-04-18 DIAGNOSIS — M545 Low back pain, unspecified: Secondary | ICD-10-CM

## 2013-04-18 DIAGNOSIS — F0390 Unspecified dementia without behavioral disturbance: Secondary | ICD-10-CM

## 2013-04-18 DIAGNOSIS — R531 Weakness: Secondary | ICD-10-CM

## 2013-04-18 DIAGNOSIS — R053 Chronic cough: Secondary | ICD-10-CM

## 2013-04-18 DIAGNOSIS — F0280 Dementia in other diseases classified elsewhere without behavioral disturbance: Secondary | ICD-10-CM

## 2013-04-18 DIAGNOSIS — G459 Transient cerebral ischemic attack, unspecified: Secondary | ICD-10-CM

## 2013-04-18 DIAGNOSIS — M791 Myalgia, unspecified site: Secondary | ICD-10-CM

## 2013-04-18 DIAGNOSIS — R5381 Other malaise: Secondary | ICD-10-CM

## 2013-04-18 DIAGNOSIS — IMO0001 Reserved for inherently not codable concepts without codable children: Secondary | ICD-10-CM

## 2013-04-18 LAB — BASIC METABOLIC PANEL
BUN: 17 mg/dL (ref 6–23)
Calcium: 8.9 mg/dL (ref 8.4–10.5)
Chloride: 107 mEq/L (ref 96–112)
Creatinine, Ser: 1.1 mg/dL (ref 0.4–1.5)
GFR: 69.41 mL/min (ref >60.00)

## 2013-04-18 LAB — HEPATIC FUNCTION PANEL
ALT: 18 U/L (ref 0–53)
AST: 20 U/L (ref 0–37)
Alkaline Phosphatase: 55 U/L (ref 39–117)
Bilirubin, Direct: 0.1 mg/dL (ref 0.0–0.3)
Total Bilirubin: 0.6 mg/dL (ref 0.3–1.2)
Total Protein: 6.8 g/dL (ref 6.0–8.3)

## 2013-04-18 LAB — LIPID PANEL
Cholesterol: 168 mg/dL (ref 0–200)
VLDL: 39.4 mg/dL (ref 0.0–40.0)

## 2013-04-18 LAB — TSH: TSH: 2.87 u[IU]/mL (ref 0.35–5.50)

## 2013-04-18 MED ORDER — BENZONATATE 100 MG PO CAPS: 100.0000 mg | ORAL_CAPSULE | Freq: Three times a day (TID) | ORAL | Status: DC | PRN

## 2013-04-18 NOTE — Assessment & Plan Note (Signed)
No relapse 

## 2013-04-18 NOTE — Progress Notes (Signed)
Subjective:    Patient ID: Douglas [name] is a 77 y.o. male.  HPI  Interim history:   Douglas [name] is a very pleasant 77 year old right-handed gentleman who presents for followup consultation of his dementia. He is accompanied by his wife, Douglas [name], again today. I first met them on 01/10/2013, at which time his MMSE was 21, clock drawing was 2, animal fluency was 12. He previously followed with Dr. Fayrene Fearing love and was last seen by him on 07/19/2012, which time Dr. Sandria Manly felt the patient had mild dementia and discussed dementia related studies. While he did not change his medication he asked the patient to take his generic Aricept during the day rather than later at night. The patient has an underlying medical history of hypertension, kidney stones, lupus anticoagulant, hyperlipidemia, left-sided stroke in 2004, vitamin D deficiency, reflux disease, prostate hypertrophy, colonic polyps, right knee pain and is status post rotator cuff surgery bilaterally. His current medications are hydrocodone as needed verapamil, omeprazole, Flomax, baby aspirin, vitamin D, Xanax, donepezil, folic acid, loratadine, Coumadin, losartan, clonidine. He has an approximately 4 year history of memory loss, particularly with forgetfulness. MRI brain with and without contrast in February 2009 showed an old infarct in the left occipital lobe, mild atrophy, and moderate small vessel disease. CT head without contrast in July 2009 and March 2012 showed no other new abnormalities. Vitamin B12 level and TSH were normal in March 2012. The patient is exercising regularly. He is independent in his daily living and drives a car. She has some depression but is not on an antidepressant. In August 2012 his MMSE was 27, clock drawing was 4, animal fluency was 14. In February 2013 his MMSE was 23, clock drawing was 4, animal fluency was 12. In July 2013 his MMSE was 23, clock drawing was 3, animal fluency was 8.  At the time of his visit with me  in may have felt that he most likely has Alzheimer's dementia. He has had some decline in his MMSE score. I asked him to start Namenda X. are, 7 mg once daily for one month then 14 mg once daily thereafter. I also asked him to see Dr. Leonides Cave for neurocognitive testing. He had that consultation on in July 2014 and I reviewed those test results with him and his wife. In essence, findings were conclusive with mild Alzheimer's dementia. He has done really well with the addition of Namenda. He is now on 14 mg strength once daily long-acting and tolerating it very well he indicates. His wife is particularly pleased to see that he is thinking more clearly and is less irritable and less angry all the time. It has overall made a significant difference in his cognitive functioning, they both agree. He reports no SEs.    His Past Medical History Is Significant For: Past Medical History  Diagnosis Date  . GERD 03/15/2007    erosive esophagitis  . HYPERTENSION 03/15/2007  . HYPERLIPIDEMIA 10/25/2007  . OSTEOARTHRITIS 03/15/2007    right knee  . CVA (cerebral infarction) 10/27/2010  . CEREBROVASCULAR ACCIDENT, HX OF 05/17/2007  . COLONIC POLYPS, HX OF 10/25/2007  . 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 BENIGN PROSTATIC HYPERTROPHY 08/12/2007  . PERIPHERAL VASCULAR DISEASE 10/16/2007    carotids < 70%  . TRANSIENT ISCHEMIC ATTACK 03/19/2008  . Nephrolithiasis   . Vitamin D deficiency   . Acquired deviated nasal septum   . Chronic maxillary sinusitis   . Chronic ethmoidal sinusitis   . Blood disorder   .  Chronic anticoagulation   . Lupus anticoagulant disorder     initially diagnosed in 2004 after CVA  . Hiatal hernia   . Stroke   . Allergy   . Clotting disorder     clotting disorder    His Past Surgical History Is Significant For: Past Surgical History  Procedure Laterality Date  . Lung biopsy  1980    s/p  . Nasal sinus surgery      s/p   . Lithotripsy      s/p  . Rotator cuff repair       x 2  . Nasal septal deviation repair    . Colonoscopy      His Family History Is Significant For: Family History  Problem Relation Age of Onset  . Alzheimer's disease Father   . Heart disease Father   . Hypertension Other   . Allergies    . Hearing loss    . Hypertension    . Rheum arthritis    . Stroke Paternal Grandmother   . Heart attack Maternal Grandfather   . Leukemia Mother   . Colon cancer Neg Hx   . Esophageal cancer Neg Hx   . Stomach cancer Neg Hx   . Rectal cancer Neg Hx     His Social History Is Significant For: History   Social History  . Marital Status: Married    Spouse Name: N/A    Number of Children: N/A  . Years of Education: N/A   Occupational History  . RETIRED [employer]   Social History Main Topics  . Smoking status: Former Smoker -- 0.50 packs/day for 20 years    Types: Cigarettes    Quit date: 08/28/1973  . Smokeless tobacco: Former Neurosurgeon    Types: Chew    Quit date: 08/28/1960  . Alcohol Use: Yes     Comment: social  . Drug Use: Yes    Special: Methaqualone  . Sexual Activity: Yes   Other Topics Concern  . Not on file   Social History Narrative  . No narrative on file    His Allergies Are:  Allergies  Allergen Reactions  . Cefuroxime Axetil Other (See Comments)    bad taste  . Hydrochlorothiazide Other (See Comments)    unknown  . Iohexol Hives       . Lovastatin Other (See Comments)    cramps  . Niacin Other (See Comments)    burning  . Pseudoeph-Doxylamine-Dm-Apap Other (See Comments)    tremor  . Ramipril Cough  . Tramadol Hcl Other (See Comments)    hallucinating  . Viagra [Sildenafil Citrate]     Feverish feeling  :   His Current Medications Are:  Outpatient Encounter Prescriptions as of 04/18/2013  Medication Sig Dispense Refill  . Aclidinium Bromide (TUDORZA PRESSAIR) 400 MCG/ACT AEPB Inhale 1 Act into the lungs 2 (two) times daily.  1 each  11  . albuterol (PROAIR HFA) 108 (90 BASE) MCG/ACT  inhaler Inhale 2 puffs into the lungs 4 (four) times daily as needed. For wheezing  8.5 g  3  . aspirin EC 81 MG tablet Take 81 mg by mouth daily.      . benzonatate (TESSALON) 100 MG capsule Take 1 capsule (100 mg total) by mouth 3 (three) times daily as needed for cough.  90 capsule  0  . cholecalciferol (VITAMIN D) 1000 UNITS tablet Take 1,000 Units by mouth daily.        . cloNIDine (CATAPRES) 0.1  MG tablet       . donepezil (ARICEPT) 10 MG tablet TAKE 1 TABLET (10 MG TOTAL) BY MOUTH EVERY MORNING.  90 tablet  1  . fluticasone (FLONASE) 50 MCG/ACT nasal spray Place 2 sprays into the nose daily.  16 g  11  . folic acid (FOLVITE) 800 MCG tablet Take 800 mcg by mouth daily.      Marland Kitchen ipratropium (ATROVENT) 0.06 % nasal spray Place 2 sprays into the nose 4 (four) times daily.  15 mL  5  . loratadine (CLARITIN) 10 MG tablet Take 1 tablet (10 mg total) by mouth daily.  100 tablet  3  . losartan (COZAAR) 100 MG tablet TAKE 1 TABLET (100 MG TOTAL) BY MOUTH AT BEDTIME.  90 tablet  2  . Memantine HCl ER (NAMENDA XR) 14 MG CP24 Take 14 mg by mouth daily after breakfast.  30 capsule  5  . nystatin (MYCOSTATIN) 100000 UNIT/ML suspension       . omeprazole (PRILOSEC) 40 MG capsule TAKE ONE CAPSULE BY MOUTH TWICE DAILY  60 capsule  4  . tamsulosin (FLOMAX) 0.4 MG CAPS Take 1 capsule (0.4 mg total) by mouth daily.  90 capsule  3  . verapamil (CALAN-SR) 180 MG CR tablet TAKE ONE TABLET BY MOUTH DAILY  90 tablet  0  . warfarin (COUMADIN) 2.5 MG tablet Take 1 tablet (2.5 mg total) by mouth as directed.  100 tablet  1  . [DISCONTINUED] cloNIDine (CATAPRES) 0.1 MG tablet TAKE 1 TABLET BY MOUTH 3 (THREE) TIMES DAILY.  90 tablet  2   No facility-administered encounter medications on file as of 04/18/2013.  : Review of Systems  Constitutional: Positive for fatigue.  Respiratory: Positive for cough.   Cardiovascular: Positive for leg swelling.  Musculoskeletal:       Joint Pain,Aching Muscles   Allergic/Immunologic:       Runny nose  Psychiatric/Behavioral:       Decreased Energy    Objective:  Neurologic Exam  Physical Exam Physical Examination:   Filed Vitals:   04/18/13 1219  BP: 139/64  Pulse: 66     General Examination: The patient is a very pleasant 77 y.o. male in no acute distress. He is calm and cooperative with the exam. He denies Auditory Hallucinations and Visual Hallucinations.   HEENT: Normocephalic, atraumatic, pupils are equal, round and reactive to light and accommodation. Funduscopic exam is normal with sharp disc margins noted. Extraocular tracking shows mild saccadic breakdown without nystagmus noted. Hearing is intact. Face is symmetric with no facial masking and normal facial sensation. There is no lip, neck or jaw tremor. Neck is not rigid with intact passive ROM. There are no carotid bruits on auscultation. Oropharynx exam reveals mild mouth dryness. No significant airway crowding is noted. Mallampati is class II. Tongue protrudes centrally and palate elevates symmetrically.    Chest: is clear to auscultation without wheezing, rhonchi or crackles noted.  Heart: sounds are regular and normal without murmurs, rubs or gallops noted.   Abdomen: is soft, non-tender and non-distended with normal bowel sounds appreciated on auscultation.  Extremities: There is no pitting edema in the distal lower extremities bilaterally.   Skin: is warm and dry with no trophic changes noted.  Musculoskeletal: exam reveals no obvious joint deformities, tenderness or joint swelling or erythema.  Neurologically:  Mental status: The patient is awake and alert, paying good  attention. He is able to partially provide the history. His wife provides details. He is oriented  to: person, place, situation and day of week. His memory, attention, language and knowledge are impaired. There is no aphasia, agnosia, apraxia or anomia. There is a mild degree of bradyphrenia. Speech is  not hypophonic with no dysarthria noted. Mood is congruent and affect is normal.  His MMSE score in May 2014 was 21/30, CDT 2/4, AFT (Animal Fluency Test) score was 12.    Cranial nerves are as described above under HEENT exam. In addition, shoulder shrug is normal with equal shoulder height noted.  Motor exam: Normal bulk, and strength for age is noted. Tone is not rigid with absence of cogwheeling. There is overall no bradykinesia. There is no drift or rebound. There is no tremor. Romberg is negative. Reflexes are 1+ in the upper extremities and 1+ in the lower extremities. Toes are downgoing bilaterally. Fine motor skills: Finger taps, hand movements, and rapid alternating patting are mildly impaired bilaterally. Foot taps and foot agility are mildly impaired bilaterally.   Cerebellar testing shows no dysmetria or intention tremor on finger to nose testing. Heel to shin is unremarkable. There is no truncal or gait ataxia.   Sensory exam is intact to light touch, pinprick, vibration, temperature sense and proprioception in the upper and lower extremities.   Gait, station and balance: He stands up from the seated position with no difficulty and needs no assistance. No veering to one side is noted. No leaning to one side. Posture is not stooped. Stance is wide-based. He turns in 3 steps. Tandem walk is not possible. Balance is mildly impaired.     Assessment and Plan:   In summary, ALRICK CUBBAGE is a very pleasant 77 y.o.-year old male with a history of memory loss, most likely Alzheimer's type dementia. His numbers had taken a decline when I first met him in 5/14 as compared to 6 months prior when he had seen Dr. Sandria Manly. I had started him on Namenda XR last time, first on 7 mg strength than on 14 mg which she continues to take. He has been tolerating it well. Both he and his wife agree that he has done much better on Namenda in addition to Aricept. He had neuropsychological testing in July and  the conclusion was consistent with mild Alzheimer's dementia. At this juncture I think we should advance Namenda XRT with maintenance dose of 21 mg. He recently filled a prescription for 14 mg strength and he can use this up. After that we can try him on a one-month retrial of 21 mg if that strength is not on backorder still. He is advised to call us in a couple of weeks to inquire about the 21 mg strength trial prescription and then after that we will maintain him on that strength. I'm pleased to see that he is better by his own report as well as his wife's report. In particular she is pleased to see that he is less irritable and less angry. I would like to see him back in 6 months from now, sooner if the need arises and they will be calling us back in the next few weeks. They were both in agreement.

## 2013-04-18 NOTE — Assessment & Plan Note (Signed)
Continue with current prescription therapy as reflected on the Med list.  

## 2013-04-18 NOTE — Assessment & Plan Note (Signed)
atrovent nasal  Gluten free trial (no wheat products) for 4-6 weeks. OK to use gluten-free bread and gluten-free pasta.  Milk free trial (no milk, ice cream, cheese and yogurt) for 4-6 weeks. OK to use almond, coconut, rice or soy milk.

## 2013-04-18 NOTE — Patient Instructions (Signed)
Gluten free trial (no wheat products) for 4-6 weeks. OK to use gluten-free bread and gluten-free pasta.  Milk free trial (no milk, ice cream, cheese and yogurt) for 4-6 weeks. OK to use almond, coconut, rice or soy milk.  

## 2013-04-18 NOTE — Assessment & Plan Note (Signed)
Tessalon prn 

## 2013-04-18 NOTE — Assessment & Plan Note (Signed)
Resolved

## 2013-04-18 NOTE — Patient Instructions (Addendum)
I think overall you are doing fairly well but I do want to suggest a few things today:  Remember to drink plenty of fluid, eat healthy meals and do not skip any meals. Try to eat protein with a every meal and eat a healthy snack such as fruit or nuts in between meals. Try to keep a regular sleep-wake schedule and try to exercise daily, particularly in the form of walking, 20-30 minutes a day, if you can.   Engage in social activities in your community and with your family and try to keep up with current events by reading the newspaper or watching the news.   As far as your medications are concerned, I would like to suggest that you use your Namenda XR 14 mg for another month, and then, we will increase you to 21 mg once daily. Please call in 10-14 days from now and request to speak with Lucille Passy, pharm tech, and request as one month free trial prescription for Namenda XR 21 mg. I have discussed this with Shanda Bumps. Once you are on the trial of 21 mg, I want you or your wife to call after 2-3 weeks, before you run out for an update and we will call in a Rx for the 21 mg strength.    As far as diagnostic testing: no new test and we may repeat neurocognitive testing in the future, 1-2 years from now.   I would like to see you back in 6 months, sooner if we need to. Please call us with any interim questions, concerns, problems, updates or refill requests.  Brett Canales is my clinical assistant and will answer any of your questions and relay your messages to me and also relay most of my messages to you.  Our phone number is [phone]. We also have an after hours call service for urgent matters and there is a physician on-call for urgent questions. For any emergencies you know to call 911 or go to the nearest emergency room.

## 2013-04-18 NOTE — Progress Notes (Signed)
The patient presents for a follow-up of  chronic hypertension, memory loss, chronic dyslipidemia,controlled with medicines.   C/o of dry cough - chronic. The cough is  worse at night and is starting to keep him up. He is not producing any sputum. He has not taken anything OTC for the cough. He denies fever, chills or body aches. He has had sick contacts.   Review of Systems    Past Medical History  Diagnosis Date  . GERD 03/15/2007    erosive esophagitis  . HYPERTENSION 03/15/2007  . HYPERLIPIDEMIA 10/25/2007  . OSTEOARTHRITIS 03/15/2007    right knee  . CVA (cerebral infarction) 10/27/2010  . CEREBROVASCULAR ACCIDENT, HX OF 05/17/2007  . COLONIC POLYPS, HX OF 10/25/2007  . 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 BENIGN PROSTATIC HYPERTROPHY 08/12/2007  . PERIPHERAL VASCULAR DISEASE 10/16/2007    carotids < 70%  . TRANSIENT ISCHEMIC ATTACK 03/19/2008  . Nephrolithiasis   . Vitamin D deficiency   . Acquired deviated nasal septum   . Chronic maxillary sinusitis   . Chronic ethmoidal sinusitis   . Blood disorder   . Chronic anticoagulation   . Lupus anticoagulant disorder     initially diagnosed in 2004 after CVA  . Hiatal hernia     Family History  Problem Relation Age of Onset  . Alzheimer's disease Father   . Heart disease Father   . Hypertension Other   . Allergies    . Hearing loss    . Hypertension    . Rheum arthritis    . Stroke Paternal Grandmother   . Heart attack Maternal Grandfather   . Leukemia Mother     History   Social History  . Marital Status: Married    Spouse Name: N/A    Number of Children: N/A  . Years of Education: N/A   Occupational History  . RETIRED DISTRICT [employer]   Social History Main Topics  . Smoking status: Former Smoker -- 0.5 packs/day for 20 years    Types: Cigarettes    Quit date: 08/28/1973  . Smokeless tobacco: Former Neurosurgeon    Types: Chew    Quit date: 08/28/1960  . Alcohol Use: Yes     Comment: social  . Drug  Use: Yes    Special: Methaqualone  . Sexually Active: Yes   Other Topics Concern  . Not on file   Social History Narrative  . No narrative on file    Allergies  Allergen Reactions  . Cefuroxime Axetil Other (See Comments)    bad taste  . Hydrochlorothiazide Other (See Comments)    unknown  . Iohexol Hives       . Lovastatin Other (See Comments)    cramps  . Niacin Other (See Comments)    burning  . Pseudoeph-Doxylamine-Dm-Apap Other (See Comments)    tremor  . Ramipril Cough  . Tramadol Hcl Other (See Comments)    hallucinating  . Viagra (Sildenafil Citrate)     Feverish feeling     Constitutional: Denies headache, fatigue and fever or abrupt weight changes.  HEENT:  Positive sore throat. Denies eye redness, eye pain, pressure behind the eyes, facial pain, nasal congestionear pain, ringing in the ears, wax buildup, runny nose or bloody nose. Respiratory: Positive cough. Denies difficulty breathing or shortness of breath.  Cardiovascular: Denies chest pain, chest tightness, palpitations or swelling in the hands or feet.   No other specific complaints in a complete review of systems (except as listed in HPI above).  Objective:    General: Appears his stated age, well developed, well nourished in NAD. HEENT: Head: normal shape and size; Eyes: sclera white, no icterus, conjunctiva pink, PERRLA and EOMs intact; Ears: Tm's gray and intact, normal light reflex; Nose: mucosa pink and moist, septum midline; Throat/Mouth: + PND. Teeth present, mucosa pink and moist, no exudate noted, no lesions or ulcerations noted.  Neck: Neck supple, trachea midline. No massses, lumps or thyromegaly present.  Cardiovascular: Normal rate and rhythm. S1,S2 noted.  No murmur, rubs or gallops noted. No JVD or BLE edema. No carotid bruits noted. Pulmonary/Chest: Normal effort and positive vesicular breath sounds. No respiratory distress. No wheezes, rales or ronchi noted.   Lab Results   Component Value Date   WBC 3.8* 10/10/2011   HGB 11.5* 10/10/2011   HCT 34.8* 10/10/2011   PLT 91* 10/10/2011   GLUCOSE 103* 10/18/2012   CHOL 151 10/18/2012   TRIG 120.0 10/18/2012   HDL 31.70* 10/18/2012   LDLDIRECT 137.9 05/11/2010   LDLCALC 95 10/18/2012   ALT 18 10/18/2012   AST 16 10/18/2012   NA 142 10/18/2012   K 4.1 10/18/2012   CL 110 10/18/2012   CREATININE 1.2 10/18/2012   BUN 19 10/18/2012   CO2 29 10/18/2012   TSH 2.88 10/18/2012   PSA 1.61 10/18/2012   INR 2.4 04/11/2013      Assessment & Plan:

## 2013-04-25 ENCOUNTER — Other Ambulatory Visit: Payer: Medicare Other

## 2013-05-05 DIAGNOSIS — N529 Male erectile dysfunction, unspecified: Secondary | ICD-10-CM | POA: Diagnosis not present

## 2013-05-05 DIAGNOSIS — N401 Enlarged prostate with lower urinary tract symptoms: Secondary | ICD-10-CM | POA: Diagnosis not present

## 2013-05-05 DIAGNOSIS — N200 Calculus of kidney: Secondary | ICD-10-CM | POA: Diagnosis not present

## 2013-05-06 ENCOUNTER — Telehealth: Payer: Self-pay

## 2013-05-06 DIAGNOSIS — F0280 Dementia in other diseases classified elsewhere without behavioral disturbance: Secondary | ICD-10-CM

## 2013-05-06 MED ORDER — MEMANTINE HCL ER 21 MG PO CP24: 21.0000 mg | ORAL_CAPSULE | Freq: Every day | ORAL | Status: DC

## 2013-05-06 NOTE — Telephone Encounter (Signed)
Sherry called, left messgae requesting samples of Namenda XR 21mg .  Unfortunately, that dose is not currently being sampled.  I called back.  Spoke with the patient.  He said they discussed increasing his dose to 21mg  at last OV, and understands there are not samples of this dose available.  I told him we will send an Rx to the pharmacy, however, some pharmacies have not been able to get the medication due to a shortage.  He would like the Rx sent anyway, and will call us back if needed. (Last OV Says: Namenda XR 14 mg for another month, and then, we will increase you to 21 mg once daily)

## 2013-05-09 ENCOUNTER — Other Ambulatory Visit: Payer: Self-pay | Admitting: Internal Medicine

## 2013-05-19 ENCOUNTER — Other Ambulatory Visit: Payer: Self-pay | Admitting: Nurse Practitioner

## 2013-05-21 MED ORDER — MEMANTINE HCL ER 7 MG PO CP24: 21.0000 mg | ORAL_CAPSULE | Freq: Every day | ORAL | Status: DC

## 2013-05-23 ENCOUNTER — Ambulatory Visit (INDEPENDENT_AMBULATORY_CARE_PROVIDER_SITE_OTHER): Payer: Medicare Other

## 2013-05-23 DIAGNOSIS — I6350 Cerebral infarction due to unspecified occlusion or stenosis of unspecified cerebral artery: Secondary | ICD-10-CM | POA: Diagnosis not present

## 2013-05-23 DIAGNOSIS — Z8679 Personal history of other diseases of the circulatory system: Secondary | ICD-10-CM | POA: Diagnosis not present

## 2013-05-23 DIAGNOSIS — G459 Transient cerebral ischemic attack, unspecified: Secondary | ICD-10-CM | POA: Diagnosis not present

## 2013-05-23 DIAGNOSIS — I639 Cerebral infarction, unspecified: Secondary | ICD-10-CM

## 2013-05-23 LAB — POCT INR: INR: 3.3

## 2013-05-28 ENCOUNTER — Ambulatory Visit (INDEPENDENT_AMBULATORY_CARE_PROVIDER_SITE_OTHER): Payer: Medicare Other

## 2013-05-28 DIAGNOSIS — Z23 Encounter for immunization: Secondary | ICD-10-CM | POA: Diagnosis not present

## 2013-05-29 ENCOUNTER — Ambulatory Visit: Payer: Medicare Other

## 2013-06-01 ENCOUNTER — Other Ambulatory Visit: Payer: Self-pay | Admitting: Internal Medicine

## 2013-06-15 ENCOUNTER — Other Ambulatory Visit: Payer: Self-pay | Admitting: Cardiovascular Disease

## 2013-06-18 ENCOUNTER — Other Ambulatory Visit: Payer: Self-pay | Admitting: Neurology

## 2013-06-20 ENCOUNTER — Ambulatory Visit (INDEPENDENT_AMBULATORY_CARE_PROVIDER_SITE_OTHER): Payer: Medicare Other | Admitting: General Practice

## 2013-06-20 DIAGNOSIS — I6350 Cerebral infarction due to unspecified occlusion or stenosis of unspecified cerebral artery: Secondary | ICD-10-CM

## 2013-06-20 DIAGNOSIS — Z8679 Personal history of other diseases of the circulatory system: Secondary | ICD-10-CM

## 2013-06-20 DIAGNOSIS — G459 Transient cerebral ischemic attack, unspecified: Secondary | ICD-10-CM

## 2013-06-20 DIAGNOSIS — I639 Cerebral infarction, unspecified: Secondary | ICD-10-CM

## 2013-06-29 ENCOUNTER — Other Ambulatory Visit: Payer: Self-pay | Admitting: Internal Medicine

## 2013-07-11 ENCOUNTER — Encounter: Payer: Self-pay | Admitting: Internal Medicine

## 2013-07-11 ENCOUNTER — Ambulatory Visit (INDEPENDENT_AMBULATORY_CARE_PROVIDER_SITE_OTHER): Payer: Medicare Other | Admitting: Internal Medicine

## 2013-07-11 VITALS — BP 118/62 | HR 68 | Temp 96.8°F | Resp 16 | Wt 179.0 lb

## 2013-07-11 DIAGNOSIS — E785 Hyperlipidemia, unspecified: Secondary | ICD-10-CM

## 2013-07-11 DIAGNOSIS — K219 Gastro-esophageal reflux disease without esophagitis: Secondary | ICD-10-CM

## 2013-07-11 DIAGNOSIS — I10 Essential (primary) hypertension: Secondary | ICD-10-CM | POA: Diagnosis not present

## 2013-07-11 DIAGNOSIS — F0390 Unspecified dementia without behavioral disturbance: Secondary | ICD-10-CM | POA: Diagnosis not present

## 2013-07-11 DIAGNOSIS — I739 Peripheral vascular disease, unspecified: Secondary | ICD-10-CM

## 2013-07-11 MED ORDER — OMEPRAZOLE 40 MG PO CPDR: DELAYED_RELEASE_CAPSULE | ORAL | Status: DC

## 2013-07-11 MED ORDER — MEMANTINE HCL ER 28 MG PO CP24: 1.0000 | ORAL_CAPSULE | Freq: Every day | ORAL | Status: DC

## 2013-07-11 NOTE — Assessment & Plan Note (Signed)
Continue with current prescription therapy as reflected on the Med list.  

## 2013-07-11 NOTE — Progress Notes (Signed)
Pre visit review using our clinic review tool, if applicable. No additional management support is needed unless otherwise documented below in the visit note. 

## 2013-07-11 NOTE — Assessment & Plan Note (Signed)
  On diet  

## 2013-07-11 NOTE — Assessment & Plan Note (Signed)
Better  

## 2013-07-11 NOTE — Progress Notes (Signed)
The patient presents for a follow-up of  chronic hypertension, memory loss, chronic dyslipidemia,controlled with medicines.   F/u of dry cough - chronic. The cough is  worse at night and is starting to keep him up. He is not producing any sputum. He has not taken anything OTC for the cough. He denies fever, chills or body aches. He has had sick contacts.       Past Medical History  Diagnosis Date  . GERD 03/15/2007    erosive esophagitis  . HYPERTENSION 03/15/2007  . HYPERLIPIDEMIA 10/25/2007  . OSTEOARTHRITIS 03/15/2007    right knee  . CVA (cerebral infarction) 10/27/2010  . CEREBROVASCULAR ACCIDENT, HX OF 05/17/2007  . COLONIC POLYPS, HX OF 10/25/2007  . 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 BENIGN PROSTATIC HYPERTROPHY 08/12/2007  . PERIPHERAL VASCULAR DISEASE 10/16/2007    carotids < 70%  . TRANSIENT ISCHEMIC ATTACK 03/19/2008  . Nephrolithiasis   . Vitamin D deficiency   . Acquired deviated nasal septum   . Chronic maxillary sinusitis   . Chronic ethmoidal sinusitis   . Blood disorder   . Chronic anticoagulation   . Lupus anticoagulant disorder     initially diagnosed in 2004 after CVA  . Hiatal hernia     Family History  Problem Relation Age of Onset  . Alzheimer's disease Father   . Heart disease Father   . Hypertension Other   . Allergies    . Hearing loss    . Hypertension    . Rheum arthritis    . Stroke Paternal Grandmother   . Heart attack Maternal Grandfather   . Leukemia Mother     History   Social History  . Marital Status: Married    Spouse Name: N/A    Number of Children: N/A  . Years of Education: N/A   Occupational History  . RETIRED [employer]   Social History Main Topics  . Smoking status: Former Smoker -- 0.5 packs/day for 20 years    Types: Cigarettes    Quit date: 08/28/1973  . Smokeless tobacco: Former Neurosurgeon    Types: Chew    Quit date: 08/28/1960  . Alcohol Use: Yes     Comment: social  . Drug Use: Yes     Special: Methaqualone  . Sexually Active: Yes   Other Topics Concern  . Not on file   Social History Narrative  . No narrative on file    Allergies  Allergen Reactions  . Cefuroxime Axetil Other (See Comments)    bad taste  . Hydrochlorothiazide Other (See Comments)    unknown  . Iohexol Hives       . Lovastatin Other (See Comments)    cramps  . Niacin Other (See Comments)    burning  . Pseudoeph-Doxylamine-Dm-Apap Other (See Comments)    tremor  . Ramipril Cough  . Tramadol Hcl Other (See Comments)    hallucinating  . Viagra (Sildenafil Citrate)     Feverish feeling     Constitutional: Denies headache, fatigue and fever or abrupt weight changes.  HEENT:  Positive sore throat. Denies eye redness, eye pain, pressure behind the eyes, facial pain, nasal congestionear pain, ringing in the ears, wax buildup, runny nose or bloody nose. Respiratory: Positive cough. Denies difficulty breathing or shortness of breath.  Cardiovascular: Denies chest pain, chest tightness, palpitations or swelling in the hands or feet.   No other specific complaints in a complete review of systems (except as listed in HPI above).  Objective:    General: Appears his stated age, well developed, well nourished in NAD. HEENT: Head: normal shape and size; Eyes: sclera white, no icterus, conjunctiva pink, PERRLA and EOMs intact; Ears: Tm's gray and intact, normal light reflex; Nose: mucosa pink and moist, septum midline; Throat/Mouth: + PND. Teeth present, mucosa pink and moist, no exudate noted, no lesions or ulcerations noted.  Neck: Neck supple, trachea midline. No massses, lumps or thyromegaly present.  Cardiovascular: Normal rate and rhythm. S1,S2 noted.  No murmur, rubs or gallops noted. No JVD or BLE edema. No carotid bruits noted. Pulmonary/Chest: Normal effort and positive vesicular breath sounds. No respiratory distress. No wheezes, rales or ronchi noted.   Lab Results  Component Value Date    WBC 3.8* 10/10/2011   HGB 11.5* 10/10/2011   HCT 34.8* 10/10/2011   PLT 91* 10/10/2011   GLUCOSE 94 04/18/2013   CHOL 168 04/18/2013   TRIG 197.0* 04/18/2013   HDL 34.00* 04/18/2013   LDLDIRECT 137.9 05/11/2010   LDLCALC 95 04/18/2013   ALT 18 04/18/2013   AST 20 04/18/2013   NA 140 04/18/2013   K 4.4 04/18/2013   CL 107 04/18/2013   CREATININE 1.1 04/18/2013   BUN 17 04/18/2013   CO2 26 04/18/2013   TSH 2.87 04/18/2013   PSA 1.61 10/18/2012   INR 2.4 06/20/2013      Assessment & Plan:

## 2013-07-17 ENCOUNTER — Telehealth: Payer: Self-pay | Admitting: *Deleted

## 2013-07-17 NOTE — Telephone Encounter (Signed)
Called about not being able to get the Namenda XR 21mg  tabs, and pt in donut hole now and the cost $400.00, asking if 28 mg tablet an option.  Looking on [facility], Dr. Posey Rea printed out prescription for Namenda EX 28mg  #90 tabs and 11 refills on 07-11-13.  He will call and speak with wife and if needs [place] will call again.

## 2013-07-18 ENCOUNTER — Ambulatory Visit (INDEPENDENT_AMBULATORY_CARE_PROVIDER_SITE_OTHER): Payer: Medicare Other | Admitting: General Practice

## 2013-07-18 DIAGNOSIS — I6350 Cerebral infarction due to unspecified occlusion or stenosis of unspecified cerebral artery: Secondary | ICD-10-CM | POA: Diagnosis not present

## 2013-07-18 DIAGNOSIS — Z8679 Personal history of other diseases of the circulatory system: Secondary | ICD-10-CM

## 2013-07-18 DIAGNOSIS — I639 Cerebral infarction, unspecified: Secondary | ICD-10-CM

## 2013-07-18 DIAGNOSIS — G459 Transient cerebral ischemic attack, unspecified: Secondary | ICD-10-CM | POA: Diagnosis not present

## 2013-07-18 LAB — POCT INR: INR: 2.4

## 2013-07-20 ENCOUNTER — Other Ambulatory Visit: Payer: Self-pay | Admitting: Internal Medicine

## 2013-07-20 ENCOUNTER — Other Ambulatory Visit: Payer: Self-pay | Admitting: Cardiovascular Disease

## 2013-08-02 ENCOUNTER — Encounter (HOSPITAL_COMMUNITY): Payer: Self-pay | Admitting: Emergency Medicine

## 2013-08-02 ENCOUNTER — Emergency Department (HOSPITAL_COMMUNITY)
Admission: EM | Admit: 2013-08-02 | Discharge: 2013-08-02 | Disposition: A | Discharge status: Home / Self Care | Payer: Medicare Other | Attending: Emergency Medicine | Admitting: Emergency Medicine

## 2013-08-02 DIAGNOSIS — Z87442 Personal history of urinary calculi: Secondary | ICD-10-CM | POA: Diagnosis not present

## 2013-08-02 DIAGNOSIS — I10 Essential (primary) hypertension: Secondary | ICD-10-CM | POA: Diagnosis not present

## 2013-08-02 DIAGNOSIS — Z8709 Personal history of other diseases of the respiratory system: Secondary | ICD-10-CM | POA: Diagnosis not present

## 2013-08-02 DIAGNOSIS — Z87891 Personal history of nicotine dependence: Secondary | ICD-10-CM | POA: Insufficient documentation

## 2013-08-02 DIAGNOSIS — Z8673 Personal history of transient ischemic attack (TIA), and cerebral infarction without residual deficits: Secondary | ICD-10-CM | POA: Diagnosis not present

## 2013-08-02 DIAGNOSIS — M79609 Pain in unspecified limb: Secondary | ICD-10-CM | POA: Insufficient documentation

## 2013-08-02 DIAGNOSIS — IMO0002 Reserved for concepts with insufficient information to code with codable children: Secondary | ICD-10-CM | POA: Insufficient documentation

## 2013-08-02 DIAGNOSIS — M1710 Unilateral primary osteoarthritis, unspecified knee: Secondary | ICD-10-CM | POA: Insufficient documentation

## 2013-08-02 DIAGNOSIS — Z79899 Other long term (current) drug therapy: Secondary | ICD-10-CM | POA: Insufficient documentation

## 2013-08-02 DIAGNOSIS — Z87448 Personal history of other diseases of urinary system: Secondary | ICD-10-CM | POA: Diagnosis not present

## 2013-08-02 DIAGNOSIS — Z860100 Personal history of colon polyps, unspecified: Secondary | ICD-10-CM | POA: Insufficient documentation

## 2013-08-02 DIAGNOSIS — Z7901 Long term (current) use of anticoagulants: Secondary | ICD-10-CM | POA: Diagnosis not present

## 2013-08-02 DIAGNOSIS — K219 Gastro-esophageal reflux disease without esophagitis: Secondary | ICD-10-CM | POA: Diagnosis not present

## 2013-08-02 DIAGNOSIS — Z7982 Long term (current) use of aspirin: Secondary | ICD-10-CM | POA: Insufficient documentation

## 2013-08-02 DIAGNOSIS — M79604 Pain in right leg: Secondary | ICD-10-CM

## 2013-08-02 DIAGNOSIS — Z862 Personal history of diseases of the blood and blood-forming organs and certain disorders involving the immune mechanism: Secondary | ICD-10-CM | POA: Diagnosis not present

## 2013-08-02 DIAGNOSIS — Z8639 Personal history of other endocrine, nutritional and metabolic disease: Secondary | ICD-10-CM | POA: Insufficient documentation

## 2013-08-02 LAB — BASIC METABOLIC PANEL
Calcium: 8.8 mg/dL (ref 8.4–10.5)
Chloride: 102 mEq/L (ref 96–112)
Creatinine, Ser: 1.19 mg/dL (ref 0.50–1.35)
GFR calc Af Amer: 67 mL/min — ABNORMAL LOW (ref >90)
GFR calc non Af Amer: 58 mL/min — ABNORMAL LOW (ref >90)
Potassium: 4.2 mEq/L (ref 3.5–5.1)

## 2013-08-02 LAB — CBC
MCH: 30.9 pg (ref 26.0–34.0)
MCHC: 34.5 g/dL (ref 30.0–36.0)
MCV: 89.8 fL (ref 78.0–100.0)
Platelets: 128 10*3/uL — ABNORMAL LOW (ref 150–400)
RDW: 13.8 % (ref 11.5–15.5)

## 2013-08-02 LAB — POCT I-STAT TROPONIN I: Troponin i, poc: 0 ng/mL (ref 0.00–0.08)

## 2013-08-02 NOTE — ED Notes (Signed)
Pt reports he started having weakness in his legs last night and this afternoon had an episode where he felt like his legs were going to "give out" from under him. Also reports he woke up with heaviness in his arms.

## 2013-08-02 NOTE — ED Provider Notes (Signed)
I saw and evaluated the patient, reviewed the resident's note and I agree with the findings and plan.  Pt described an episode of leg cramping but denies feeling week.  Nursing notes mentioned weakness however pt elaborated and states that he felt that it was more of a muscle cramp.  No symptoms in the ED.  Normal neuro exam including gait.  At this time there does not appear to be any evidence of an acute emergency medical condition and the patient appears stable for discharge with appropriate outpatient follow up.   Celene Kras, MD 08/02/13 [phone]

## 2013-08-02 NOTE — ED Notes (Signed)
Patient ambulated to bathroom without incident 

## 2013-08-02 NOTE — ED Provider Notes (Signed)
CSN: [id number]     Arrival date & time 08/02/13  1601 History   First MD Initiated Contact with Patient 08/02/13 1632     Chief Complaint  Patient presents with  . Weakness   (Consider location/radiation/quality/duration/timing/severity/associated sxs/prior Treatment) HPI  Chief complaint right leg pain  Patient's a 77 year old male past medical history significant for CVA, TIAs who comes in with chief complaint of right leg pain. Initially patient stated this was a weakness in triage. However upon speaking with patient states this is a not a true weakness. Patient with history of right knee pain and muscle pain in right leg. Patient reports he was walking through kitchen when he had an increased amount of pain in right leg. Felt muscles getting very tight and stumbled slightly. His leg did "give out" however this was not a weakness it was pain and muscle aches causing him to remove weight from light. This pain was located in the right leg without radiation. It was a aching pain which lasted several seconds. Patient did not have any treatment for this. This was self-limited and patient reports being able to ambulate within 20 seconds after. No pain now at rest. Patient did not fall he simply leaned against a counter. Denies hitting head or any other part of body.   Past Medical History  Diagnosis Date  . GERD 03/15/2007    erosive esophagitis  . HYPERTENSION 03/15/2007  . HYPERLIPIDEMIA 10/25/2007  . OSTEOARTHRITIS 03/15/2007    right knee  . CVA (cerebral infarction) 10/27/2010  . CEREBROVASCULAR ACCIDENT, HX OF 05/17/2007  . COLONIC POLYPS, HX OF 10/25/2007  . 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 BENIGN PROSTATIC HYPERTROPHY 08/12/2007  . PERIPHERAL VASCULAR DISEASE 10/16/2007    carotids < 70%  . TRANSIENT ISCHEMIC ATTACK 03/19/2008  . Nephrolithiasis   . Vitamin D deficiency   . Acquired deviated nasal septum   . Chronic maxillary sinusitis   . Chronic ethmoidal sinusitis   .  Blood disorder   . Chronic anticoagulation   . Lupus anticoagulant disorder     initially diagnosed in 2004 after CVA  . Hiatal hernia   . Stroke   . Allergy   . Clotting disorder     clotting disorder   Past Surgical History  Procedure Laterality Date  . Lung biopsy  1980    s/p  . Nasal sinus surgery      s/p   . Lithotripsy      s/p  . Rotator cuff repair      x 2  . Nasal septal deviation repair    . Colonoscopy     Family History  Problem Relation Age of Onset  . Alzheimer's disease Father   . Heart disease Father   . Hypertension Other   . Allergies    . Hearing loss    . Hypertension    . Rheum arthritis    . Stroke Paternal Grandmother   . Heart attack Maternal Grandfather   . Leukemia Mother   . Colon cancer Neg Hx   . Esophageal cancer Neg Hx   . Stomach cancer Neg Hx   . Rectal cancer Neg Hx    History  Substance Use Topics  . Smoking status: Former Smoker -- 0.50 packs/day for 20 years    Types: Cigarettes    Quit date: 08/28/1973  . Smokeless tobacco: Former Neurosurgeon    Types: Chew    Quit date: 08/28/1960  . Alcohol Use: Yes  Comment: social    Review of Systems  Constitutional: Negative for fever and fatigue.  Respiratory: Negative for shortness of breath.   Cardiovascular: Negative for chest pain.  Gastrointestinal: Negative for abdominal pain.  Genitourinary: Negative for dysuria.  Musculoskeletal: Positive for arthralgias (knee pain).  Skin: Negative for rash.  Neurological: Negative for headaches.  All other systems reviewed and are negative.    Allergies  Cefuroxime axetil; Hydrochlorothiazide; Iohexol; Lovastatin; Niacin; Pseudoeph-doxylamine-dm-apap; Ramipril; Tramadol hcl; and Viagra  Home Medications   Current Outpatient Rx  Name  Route  Sig  Dispense  Refill  . Aclidinium Bromide (TUDORZA PRESSAIR) 400 MCG/ACT AEPB   Inhalation   Inhale 1 Act into the lungs 2 (two) times daily.   1 each   11   . albuterol (PROAIR  HFA) 108 (90 BASE) MCG/ACT inhaler   Inhalation   Inhale 2 puffs into the lungs 4 (four) times daily as needed. For wheezing   8.5 g   3   . aspirin EC 81 MG tablet   Oral   Take 81 mg by mouth daily.         . benzonatate (TESSALON) 100 MG capsule   Oral   Take 1 capsule (100 mg total) by mouth 3 (three) times daily as needed for cough.   90 capsule   0   . cholecalciferol (VITAMIN D) 1000 UNITS tablet   Oral   Take 1,000 Units by mouth daily.           . cloNIDine (CATAPRES) 0.1 MG tablet   Oral   Take 0.1 mg by mouth 2 (two) times daily.         Marland Kitchen donepezil (ARICEPT) 10 MG tablet      TAKE 1 TABLET (10 MG TOTAL) BY MOUTH EVERY MORNING.   90 tablet   2     CYCLE FILL MEDICATION. Authorization is required f ...   . fluticasone (FLONASE) 50 MCG/ACT nasal spray   Nasal   Place 2 sprays into the nose daily.   16 g   11   . folic acid (FOLVITE) 800 MCG tablet   Oral   Take 800 mcg by mouth daily.         Marland Kitchen ipratropium (ATROVENT) 0.06 % nasal spray   Nasal   Place 2 sprays into the nose 4 (four) times daily.   15 mL   5   . loratadine (CLARITIN) 10 MG tablet   Oral   Take 1 tablet (10 mg total) by mouth daily.   100 tablet   3   . losartan (COZAAR) 100 MG tablet      TAKE 1 TABLET (100 MG TOTAL) BY MOUTH AT BEDTIME.   90 tablet   2   . Memantine HCl ER (NAMENDA XR) 28 MG CP24   Oral   Take 28 mg by mouth daily.   90 capsule   11   . omeprazole (PRILOSEC) 40 MG capsule      TAKE ONE CAPSULE BY MOUTH TWICE DAILY   180 capsule   3   . tamsulosin (FLOMAX) 0.4 MG CAPS   Oral   Take 1 capsule (0.4 mg total) by mouth daily.   90 capsule   3   . verapamil (CALAN-SR) 180 MG CR tablet      TAKE ONE TABLET BY MOUTH DAILY. CALL MD OFFICE TO SCHEDULE FOLLOW UP APPT BEFORE MORE REFILL   30 tablet   0     NO  FURTHER REFILLS WITHOUT APPOINTMENT   . warfarin (COUMADIN) 2.5 MG tablet   Oral   Take 1 tablet (2.5 mg total) by mouth as  directed.   100 tablet   1     CYCLE FILL MEDICATION. Authorization is required f ...    BP 144/72  Pulse 74  Temp(Src) 98.1 F (36.7 C) (Oral)  Resp 18  Ht 5\' 7"  (1.702 m)  Wt 177 lb 4.8 oz (80.423 kg)  BMI 27.76 kg/m2  SpO2 98% Physical Exam  Nursing note and vitals reviewed. Constitutional: He is oriented to person, place, and time. He appears well-developed and well-nourished.  HENT:  Head: Normocephalic and atraumatic.  Eyes: EOM are normal. Pupils are equal, round, and reactive to light.  Neck: Normal range of motion.  Cardiovascular: Normal rate, regular rhythm and intact distal pulses.   Pulmonary/Chest: Effort normal and breath sounds normal. No respiratory distress. He exhibits no tenderness.  Abdominal: Soft. He exhibits no distension. There is no tenderness. There is no rebound and no guarding.  Musculoskeletal: Normal range of motion.  Neurological: He is alert and oriented to person, place, and time. He has normal reflexes. No cranial nerve deficit or sensory deficit. He exhibits normal muscle tone. Coordination (finger nose finger normal and gait normal when walked in ED) and gait normal. GCS eye subscore is 4. GCS verbal subscore is 5. GCS motor subscore is 6.  R leg with no visible deformities or injuries. Normal ROM, neurovasc intact. DTR normal throughout.   Skin: Skin is warm and dry. No rash noted.  Psychiatric: He has a normal mood and affect. His behavior is normal. Judgment and thought content normal.    ED Course  Procedures (including critical care time) Labs Review Labs Reviewed  CBC - Abnormal; Notable for the following:    Platelets 128 (*)    All other components within normal limits  BASIC METABOLIC PANEL - Abnormal; Notable for the following:    Glucose, Bld 114 (*)    GFR calc non Af Amer 58 (*)    GFR calc Af Amer 67 (*)    All other components within normal limits  POCT I-STAT TROPONIN I   Imaging Review No results found.  EKG  Interpretation   None       MDM   1. Right leg pain     Afebrile vital signs stable on arrival. Patient with signs and symptoms consistent with muscle cramping and chronic knee pain. Patient denies any true weakness, motor loss or sensation deficits. This does not appear to be a CVA, TIA or other serious pathology. Patient with chronic knee pain and leg pain. States this was similar to chronic pains. States he only stumbled and took weight off the leg secondary to this cramping and pain. Patient did not fall, has no visible injuries deformities or tenderness on thorough secondary examination. Patient also states with history of TIAs however none have been similar to this. Patient on exam has a completely normal neurological exam. He has no weakness, sensation deficits, coordination abnormalities. Patient is able to ambulate in the emergency department with no issues. Do not believe any serious pathology. Discussed with patient and family plan of care. Discussed that patient has multiple TIAs and CVA, thus he is higher risk for complications and further TIA/CVA. However with no active neurological issues and story inconsistent with CVA/TIA believe it would be appropriate for discharge home and strict return precautions. However did leave a option to family for  further investigation at this time (labs, CT, possible admit MRI etc). Patient and family agree no need for further evaluation at this time. We discussed return precautions and patient and family voiced understanding. Patient was then discharged with no further acute issues   Bridgett Larsson, MD 08/02/13 [phone]

## 2013-08-02 NOTE — ED Notes (Signed)
Patient discharged to home with family. NAD.  

## 2013-08-13 ENCOUNTER — Other Ambulatory Visit: Payer: Self-pay | Admitting: Internal Medicine

## 2013-08-13 ENCOUNTER — Other Ambulatory Visit: Payer: Self-pay | Admitting: Cardiovascular Disease

## 2013-08-15 ENCOUNTER — Ambulatory Visit (INDEPENDENT_AMBULATORY_CARE_PROVIDER_SITE_OTHER): Payer: Medicare Other

## 2013-08-15 DIAGNOSIS — G459 Transient cerebral ischemic attack, unspecified: Secondary | ICD-10-CM | POA: Diagnosis not present

## 2013-08-15 DIAGNOSIS — I6350 Cerebral infarction due to unspecified occlusion or stenosis of unspecified cerebral artery: Secondary | ICD-10-CM

## 2013-08-15 DIAGNOSIS — Z8679 Personal history of other diseases of the circulatory system: Secondary | ICD-10-CM | POA: Diagnosis not present

## 2013-08-15 DIAGNOSIS — I639 Cerebral infarction, unspecified: Secondary | ICD-10-CM

## 2013-08-17 ENCOUNTER — Other Ambulatory Visit: Payer: Self-pay | Admitting: Cardiovascular Disease

## 2013-08-18 ENCOUNTER — Other Ambulatory Visit: Payer: Self-pay | Admitting: Cardiovascular Disease

## 2013-09-07 ENCOUNTER — Other Ambulatory Visit: Payer: Self-pay | Admitting: Cardiovascular Disease

## 2013-09-19 ENCOUNTER — Encounter: Payer: Self-pay | Admitting: Cardiovascular Disease

## 2013-09-19 ENCOUNTER — Other Ambulatory Visit: Payer: Self-pay | Admitting: *Deleted

## 2013-09-19 ENCOUNTER — Ambulatory Visit (INDEPENDENT_AMBULATORY_CARE_PROVIDER_SITE_OTHER): Payer: Medicare Other | Admitting: Cardiovascular Disease

## 2013-09-19 ENCOUNTER — Ambulatory Visit: Payer: Medicare Other | Admitting: Cardiovascular Disease

## 2013-09-19 VITALS — BP 126/64 | HR 56 | Ht 67.0 in | Wt 181.0 lb

## 2013-09-19 DIAGNOSIS — I10 Essential (primary) hypertension: Secondary | ICD-10-CM | POA: Diagnosis not present

## 2013-09-19 MED ORDER — LOSARTAN POTASSIUM 100 MG PO TABS: 100.0000 mg | ORAL_TABLET | Freq: Every day | ORAL | Status: DC

## 2013-09-19 MED ORDER — VERAPAMIL HCL ER 180 MG PO TBCR: 180.0000 mg | EXTENDED_RELEASE_TABLET | Freq: Every day | ORAL | Status: DC

## 2013-09-19 MED ORDER — CLONIDINE HCL 0.1 MG PO TABS: 0.1000 mg | ORAL_TABLET | Freq: Three times a day (TID) | ORAL | Status: DC

## 2013-09-19 NOTE — Assessment & Plan Note (Signed)
Progressive F/U neuro no bradycardia on aricept

## 2013-09-19 NOTE — Progress Notes (Signed)
Patient ID: Douglas [name], male   DOB: [birth year], 78 y.o.   MRN: [id number] Douglas [name] is seen back today for a follow up visit.  He has a history of a hypercoagulable state with a positive lupus anticoagulate and is on chronic coumadin. He has had prior CVA, PVD, HTN, HLD and dementia.   He has hypertension and hyperlipidemia. Carotid Dopplers in  5/14  demonstrated 0-39% bilateral ICA stenosis. He was seen in March 2011 with complaints of chest pain. He was enrolled in the Promise trial and randomized to Myoview testing. The nuclear study demonstrated an ejection fraction of 59%, inferior and apical thinning but no ischemia. His last echocardiogram was in February 2009 and demonstrated normal LV function with an ejection fraction of 55-60%.  Renal duplex from 7/13  reviewed No AAA and no RAS  Dementia is progressive Not driving lives with daughter  Laverle Hobby to talk about [place] sports    ROS: Denies fever, malais, weight loss, blurry vision, decreased visual acuity, cough, sputum, SOB, hemoptysis, pleuritic pain, palpitaitons, heartburn, abdominal pain, melena, lower extremity edema, claudication, or rash.  All other systems reviewed and negative  General: Affect appropriate Healthy:  appears stated age 78: normal Neck supple with no adenopathy JVP normal no bruits no thyromegaly Lungs clear with no wheezing and good diaphragmatic motion Heart:  S1/S2 no murmur, no rub, gallop or click PMI normal Abdomen: benighn, BS positve, no tenderness, no AAA no bruit.  No HSM or HJR Distal pulses intact with no bruits No edema Neuro non-focal Skin warm and dry No muscular weakness     Allergies  Cefuroxime axetil; Hydrochlorothiazide; Iohexol; Lovastatin; Niacin; Pseudoeph-doxylamine-dm-apap; Ramipril; Tramadol hcl; and Viagra  Electrocardiogram:  SR rate 68 normal   Assessment and Plan

## 2013-09-19 NOTE — Patient Instructions (Signed)
Your physician wants you to follow-up in: YEAR WITH DR NISHAN  You will receive a reminder letter in the mail two months in advance. If you don't receive a letter, please call our office to schedule the follow-up appointment.  Your physician recommends that you continue on your current medications as directed. Please refer to the Current Medication list given to you today. 

## 2013-09-19 NOTE — Assessment & Plan Note (Signed)
Continue coumadin f/u clinic Despite dementia he is not a fall risk No bleeding issues

## 2013-09-19 NOTE — Assessment & Plan Note (Signed)
Well controlled.  Continue current medications and low sodium Dash type diet.    

## 2013-09-26 ENCOUNTER — Ambulatory Visit (INDEPENDENT_AMBULATORY_CARE_PROVIDER_SITE_OTHER): Payer: Medicare Other

## 2013-09-26 DIAGNOSIS — I6350 Cerebral infarction due to unspecified occlusion or stenosis of unspecified cerebral artery: Secondary | ICD-10-CM | POA: Diagnosis not present

## 2013-09-26 DIAGNOSIS — G459 Transient cerebral ischemic attack, unspecified: Secondary | ICD-10-CM | POA: Diagnosis not present

## 2013-09-26 DIAGNOSIS — Z8679 Personal history of other diseases of the circulatory system: Secondary | ICD-10-CM

## 2013-09-26 DIAGNOSIS — Z5181 Encounter for therapeutic drug level monitoring: Secondary | ICD-10-CM | POA: Diagnosis not present

## 2013-09-26 DIAGNOSIS — I639 Cerebral infarction, unspecified: Secondary | ICD-10-CM

## 2013-09-26 LAB — POCT INR: INR: 2.2

## 2013-10-24 ENCOUNTER — Ambulatory Visit (INDEPENDENT_AMBULATORY_CARE_PROVIDER_SITE_OTHER): Payer: Medicare Other | Admitting: Neurology

## 2013-10-24 ENCOUNTER — Encounter: Payer: Self-pay | Admitting: Neurology

## 2013-10-24 VITALS — BP 125/68 | HR 60 | Temp 96.5°F | Ht 65.0 in | Wt 175.0 lb

## 2013-10-24 DIAGNOSIS — F0280 Dementia in other diseases classified elsewhere without behavioral disturbance: Secondary | ICD-10-CM | POA: Diagnosis not present

## 2013-10-24 DIAGNOSIS — I6350 Cerebral infarction due to unspecified occlusion or stenosis of unspecified cerebral artery: Secondary | ICD-10-CM | POA: Diagnosis not present

## 2013-10-24 DIAGNOSIS — Z8673 Personal history of transient ischemic attack (TIA), and cerebral infarction without residual deficits: Secondary | ICD-10-CM | POA: Diagnosis not present

## 2013-10-24 DIAGNOSIS — G309 Alzheimer's disease, unspecified: Secondary | ICD-10-CM | POA: Diagnosis not present

## 2013-10-24 MED ORDER — DONEPEZIL HCL 10 MG PO TABS: ORAL_TABLET | ORAL | Status: DC

## 2013-10-24 MED ORDER — MEMANTINE HCL ER 28 MG PO CP24: 1.0000 | ORAL_CAPSULE | Freq: Every day | ORAL | Status: DC

## 2013-10-24 NOTE — Progress Notes (Signed)
Subjective:    Patient ID: Douglas [name] is a 78 y.o. male.  HPI    Interim history:   Douglas [name] is a very pleasant 78 year old right-handed gentleman with an underlying medical history of hypertension, kidney stones, lupus anticoagulant, hyperlipidemia, left-sided stroke in 2004, vitamin D deficiency, reflux disease, prostate hypertrophy, colonic polyps, right knee pain and status post rotator cuff surgery bilaterally, who presents for followup consultation of his Alzheimer's dementia. He is accompanied by his daughter, Douglas [name], today. I last saw him on 04/18/2013, at which time I increased his Namenda XR to 21 mg daily. However they were not able to fill it because of shortage of the medication and he has been on Namenda XR 28 mg since November 2014, per PCP.  Today, he states, he has been doing fairly well and his daughter reports good and bad days. He drives, but very little. He passed his test at the [facility]. His wife works during the day.   I first met him on 01/10/2013, at which time his MMSE was 21, clock drawing was 2, animal fluency was 12. I felt that he most likely has Alzheimer's dementia. He had some decline in his MMSE score. I asked him to start Namenda XR 7 mg once daily for one month then 14 mg once daily thereafter. I also asked him to see Douglas [name] for neurocognitive testing. He had that consultation in July 2014: In essence, findings were conclusive with mild Alzheimer's dementia. He did well with the addition of Namenda. He was tolerating it well and his wife was particularly pleased to see that he was thinking more clearly and was less irritable and less angry all the time. It had made a significant difference in his cognitive functioning, they both agreed and he reported no SEs.  He previously followed with Douglas [name] and was last seen by him on 07/19/2012, which time Douglas [name] felt the patient had mild dementia and discussed dementia related studies. While he did not  change his medication he asked the patient to take his generic Aricept during the day rather than later at night.  He has an approximately 5 year history of memory loss, particularly forgetfulness. MRI brain with and without contrast in February 2009 showed an old infarct in the left occipital lobe, mild atrophy, and moderate small vessel disease. CT head without contrast in July 2009 and March 2012 showed no other new abnormalities. Vitamin B12 level and TSH were normal in March 2012. The patient is exercising regularly. He is independent in his daily living and drives a car. She has some depression but is not on an antidepressant. In August 2012 his MMSE was 27, clock drawing was 4, animal fluency was 14. In February 2013 his MMSE was 23, clock drawing was 4, animal fluency was 12. In July 2013 his MMSE was 23, clock drawing was 3, animal fluency was 8.   His Past Medical History Is Significant For: Past Medical History  Diagnosis Date  . GERD 03/15/2007    erosive esophagitis  . HYPERTENSION 03/15/2007  . HYPERLIPIDEMIA 10/25/2007  . OSTEOARTHRITIS 03/15/2007    right knee  . CVA (cerebral infarction) 10/27/2010  . CEREBROVASCULAR ACCIDENT, HX OF 05/17/2007  . COLONIC POLYPS, HX OF 10/25/2007  . 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 BENIGN PROSTATIC HYPERTROPHY 08/12/2007  . PERIPHERAL VASCULAR DISEASE 10/16/2007    carotids < 70%  . TRANSIENT ISCHEMIC ATTACK 03/19/2008  . Nephrolithiasis   . Vitamin D deficiency   .  Acquired deviated nasal septum   . Chronic maxillary sinusitis   . Chronic ethmoidal sinusitis   . Blood disorder   . Chronic anticoagulation   . Lupus anticoagulant disorder     initially diagnosed in 2004 after CVA  . Hiatal hernia   . Stroke   . Allergy   . Clotting disorder     clotting disorder    His Past Surgical History Is Significant For: Past Surgical History  Procedure Laterality Date  . Lung biopsy  1980    s/p  . Nasal sinus surgery      s/p   .  Lithotripsy      s/p  . Rotator cuff repair      x 2  . Nasal septal deviation repair    . Colonoscopy      His Family History Is Significant For: Family History  Problem Relation Age of Onset  . Alzheimer's disease Father   . Heart disease Father   . Hypertension Other   . Allergies    . Hearing loss    . Hypertension    . Rheum arthritis    . Stroke Paternal Grandmother   . Heart attack Maternal Grandfather   . Leukemia Mother   . Colon cancer Neg Hx   . Esophageal cancer Neg Hx   . Stomach cancer Neg Hx   . Rectal cancer Neg Hx     His Social History Is Significant For: History   Social History  . Marital Status: Married    Spouse Name: N/A    Number of Children: N/A  . Years of Education: N/A   Occupational History  . RETIRED [employer]   Social History Main Topics  . Smoking status: Former Smoker -- 0.50 packs/day for 20 years    Types: Cigarettes    Quit date: 08/28/1973  . Smokeless tobacco: Former Systems developer    Types: [place] date: 08/28/1960  . Alcohol Use: Yes     Comment: social  . Drug Use: Yes    Special: Methaqualone  . Sexual Activity: Yes   Other Topics Concern  . None   Social History Narrative  . None    His Allergies Are:  Allergies  Allergen Reactions  . Cefuroxime Axetil Other (See Comments)    bad taste  . Hydrochlorothiazide Other (See Comments)    unknown  . Iohexol Hives       . Lovastatin Other (See Comments)    cramps  . Niacin Other (See Comments)    burning  . Pseudoeph-Doxylamine-Dm-Apap Other (See Comments)    tremor  . Ramipril Cough  . Tramadol Hcl Other (See Comments)    hallucinating  . Viagra [Sildenafil Citrate]     Feverish feeling  :   His Current Medications Are:  Outpatient Encounter Prescriptions as of 10/24/2013  Medication Sig  . Aclidinium Bromide (TUDORZA PRESSAIR) 400 MCG/ACT AEPB Inhale 1 Act into the lungs 2 (two) times daily.  Marland Kitchen albuterol (PROAIR HFA) 108 (90 BASE) MCG/ACT  inhaler Inhale 2 puffs into the lungs 4 (four) times daily as needed. For wheezing  . aspirin EC 81 MG tablet Take 81 mg by mouth daily.  . benzonatate (TESSALON) 100 MG capsule Take 1 capsule (100 mg total) by mouth 3 (three) times daily as needed for cough.  . cholecalciferol (VITAMIN D) 1000 UNITS tablet Take 1,000 Units by mouth daily.    . cloNIDine (CATAPRES) 0.1 MG tablet Take 1 tablet (0.1  mg total) by mouth 3 (three) times daily.  Marland Kitchen donepezil (ARICEPT) 10 MG tablet TAKE 1 TABLET (10 MG TOTAL) BY MOUTH EVERY MORNING.  . fluticasone (FLONASE) 50 MCG/ACT nasal spray Place 2 sprays into the nose daily.  . folic acid (FOLVITE) 409 MCG tablet Take 800 mcg by mouth daily.  Marland Kitchen loratadine (CLARITIN) 10 MG tablet Take 1 tablet (10 mg total) by mouth daily.  Marland Kitchen losartan (COZAAR) 100 MG tablet Take 1 tablet (100 mg total) by mouth daily.  . Memantine HCl ER (NAMENDA XR) 28 MG CP24 Take 28 mg by mouth daily.  Marland Kitchen omeprazole (PRILOSEC) 40 MG capsule TAKE ONE CAPSULE BY MOUTH TWICE DAILY  . tamsulosin (FLOMAX) 0.4 MG CAPS Take 1 capsule (0.4 mg total) by mouth daily.  . verapamil (CALAN-SR) 180 MG CR tablet Take 1 tablet (180 mg total) by mouth daily.  Marland Kitchen warfarin (COUMADIN) 2.5 MG tablet Take 1 tablet (2.5 mg total) by mouth as directed.  Marland Kitchen ipratropium (ATROVENT) 0.06 % nasal spray Place 2 sprays into the nose 4 (four) times daily.   Review of Systems:  Out of a complete 14 point review of systems, all are reviewed and negative with the exception of these symptoms as listed below:   Review of Systems  Constitutional: Negative.   HENT: Negative.   Eyes: Negative.   Respiratory: Negative.   Cardiovascular: Negative.   Gastrointestinal: Negative.   Endocrine: Negative.   Musculoskeletal: Negative.   Skin: Negative.   Allergic/Immunologic: Negative.   Neurological: Negative.   Hematological: Negative.   Psychiatric/Behavioral: Negative.   All other systems reviewed and are  negative.    Objective:  Neurologic Exam  Physical Exam Physical Examination:   Filed Vitals:   10/24/13 1142  BP: 125/68  Pulse: 60  Temp: 96.5 F (35.8 C)    General Examination: The patient is a very pleasant 78 y.o. male in no acute distress. He is calm and cooperative with the exam. He denies Auditory Hallucinations and Visual Hallucinations. He is anxious appearing today.  HEENT: Normocephalic, atraumatic, pupils are equal, round and reactive to light and accommodation. Funduscopic exam is normal with sharp disc margins noted. Extraocular tracking shows mild saccadic breakdown without nystagmus noted. Hearing is intact. Face is symmetric with no facial masking and normal facial sensation. There is no lip, neck or jaw tremor. Neck is not rigid with intact passive ROM. There are no carotid bruits on auscultation. Oropharynx exam reveals mild mouth dryness. No significant airway crowding is noted. Mallampati is class II. Tongue protrudes centrally and palate elevates symmetrically.    Chest: is clear to auscultation without wheezing, rhonchi or crackles noted.  Heart: sounds are regular and normal without murmurs, rubs or gallops noted.   Abdomen: is soft, non-tender and non-distended with normal bowel sounds appreciated on auscultation.  Extremities: There is no pitting edema in the distal lower extremities bilaterally.   Skin: is warm and dry with no trophic changes noted.  Musculoskeletal: exam reveals no obvious joint deformities, tenderness or joint swelling or erythema.  Neurologically:  Mental status: The patient is awake and alert, paying good  attention. He is able to partially provide the history. His daughter provides details. He is oriented to: person, place, situation and day of week.  His memory, attention, language and knowledge are impaired. MMSE: 21/30, CDT: 1/4, and AFT: 8/min.  There is no aphasia, agnosia, apraxia or anomia. There is a mild degree of  bradyphrenia. Speech is not hypophonic with no dysarthria  noted. Mood is congruent and affect is normal.   His MMSE score in May 2014 was 21/30, CDT 2/4, AFT (Animal Fluency Test) score was 12.    Cranial nerves are as described above under HEENT exam. In addition, shoulder shrug is normal with equal shoulder height noted.  Motor exam: Normal bulk, and strength for age is noted. Tone is not rigid with absence of cogwheeling. There is overall no bradykinesia. There is no drift or rebound. There is no tremor. Romberg is negative. Reflexes are 1+ in the upper extremities and 1+ in the lower extremities. Toes are downgoing bilaterally. Fine motor skills: Finger taps, hand movements, and rapid alternating patting are mildly impaired bilaterally. Foot taps and foot agility are mildly impaired bilaterally.   Cerebellar testing shows no dysmetria or intention tremor on finger to nose testing. Heel to shin is unremarkable. There is no truncal or gait ataxia.   Sensory exam is intact to light touch, pinprick, vibration, temperature sense in the upper and lower extremities.   Gait, station and balance: He stands up from the seated position with no difficulty and needs no assistance. No veering to one side is noted. No leaning to one side. Posture is not stooped. Stance is wide-based. He turns in 3 steps. Tandem walk is not possible. Balance is mildly impaired.   Assessment and Plan:   In summary, RANDOM DOBROWSKI is a very pleasant 78 year old male with a history of memory loss, most likely Alzheimer's type dementia. While his MMSE is stable today, I do detect a decline in his memory in that he seems slower in responding and more hesitant, and his attention was slightly worse and his visuospatial skills seem worse. He is on Namenda and Aricept and I would for him to continue Namenda XR at 28 mg daily and Aricept at 10 mg daily. His numbers had taken a decline when I first met him in 5/14 as compared to when  he had seen Douglas [name]. I had started him on Namenda XR, first on 7 mg strength, then 14 mg and he is now on 21 mg, as we checked with his wife. He has been tolerating it well. He had neuropsychological testing in July 2014, and the conclusion was consistent with mild Alzheimer's dementia. I would like to repeat this sometime later this year or early next year. At this juncture I think we should advance Namenda XR to 28 mg and continue with Aricept. I have asked him to stay active mentally and physically. I have asked him to drink more water. I would like to see him back in 4 months from now, sooner if the need arises and they will be calling us back in the next few weeks. They were both in agreement.

## 2013-10-24 NOTE — Patient Instructions (Signed)
I think overall you are doing fairly well but I do want to suggest a few things today:  Remember to drink plenty of fluid, eat healthy meals and do not skip any meals. Try to eat protein with a every meal and eat a healthy snack such as fruit or nuts in between meals. Try to keep a regular sleep-wake schedule and try to exercise daily, particularly in the form of walking, 20-30 minutes a day, if you can.   Engage in social activities in your community and with your family and try to keep up with current events by reading the newspaper or watching the news.   As far as your medications are concerned, I would like to suggest that you continue with Aricept 10 mg daily, and Namenda XR 28 mg daily.   As far as diagnostic testing: no new test at this time.   I would like to see you back in 4 months, sooner if we need to. Please call us with any interim questions, concerns, problems, updates or refill requests.  Our nursing staff will answer any of your questions and relay your messages to me and also relay most of my messages to you.  Our phone number is [phone]. We also have an after hours call service for urgent matters and there is a physician on-call for urgent questions. For any emergencies you know to call 911 or go to the nearest emergency room.

## 2013-11-06 ENCOUNTER — Ambulatory Visit (INDEPENDENT_AMBULATORY_CARE_PROVIDER_SITE_OTHER): Payer: Medicare Other

## 2013-11-06 DIAGNOSIS — I6350 Cerebral infarction due to unspecified occlusion or stenosis of unspecified cerebral artery: Secondary | ICD-10-CM

## 2013-11-06 DIAGNOSIS — Z8679 Personal history of other diseases of the circulatory system: Secondary | ICD-10-CM | POA: Diagnosis not present

## 2013-11-06 DIAGNOSIS — G459 Transient cerebral ischemic attack, unspecified: Secondary | ICD-10-CM | POA: Diagnosis not present

## 2013-11-06 DIAGNOSIS — Z5181 Encounter for therapeutic drug level monitoring: Secondary | ICD-10-CM | POA: Diagnosis not present

## 2013-11-06 DIAGNOSIS — I639 Cerebral infarction, unspecified: Secondary | ICD-10-CM

## 2013-11-06 LAB — POCT INR: INR: 3.1

## 2013-11-07 ENCOUNTER — Other Ambulatory Visit (INDEPENDENT_AMBULATORY_CARE_PROVIDER_SITE_OTHER): Payer: Medicare Other

## 2013-11-07 ENCOUNTER — Ambulatory Visit (INDEPENDENT_AMBULATORY_CARE_PROVIDER_SITE_OTHER): Payer: Medicare Other | Admitting: Internal Medicine

## 2013-11-07 ENCOUNTER — Encounter: Payer: Self-pay | Admitting: Internal Medicine

## 2013-11-07 VITALS — BP 120/60 | HR 60 | Temp 97.1°F | Wt 175.0 lb

## 2013-11-07 DIAGNOSIS — I6350 Cerebral infarction due to unspecified occlusion or stenosis of unspecified cerebral artery: Secondary | ICD-10-CM

## 2013-11-07 DIAGNOSIS — M1990 Unspecified osteoarthritis, unspecified site: Secondary | ICD-10-CM

## 2013-11-07 DIAGNOSIS — F0390 Unspecified dementia without behavioral disturbance: Secondary | ICD-10-CM

## 2013-11-07 DIAGNOSIS — M545 Low back pain, unspecified: Secondary | ICD-10-CM | POA: Diagnosis not present

## 2013-11-07 DIAGNOSIS — R531 Weakness: Secondary | ICD-10-CM

## 2013-11-07 DIAGNOSIS — M179 Osteoarthritis of knee, unspecified: Secondary | ICD-10-CM

## 2013-11-07 DIAGNOSIS — R5383 Other fatigue: Secondary | ICD-10-CM

## 2013-11-07 DIAGNOSIS — IMO0002 Reserved for concepts with insufficient information to code with codable children: Secondary | ICD-10-CM | POA: Diagnosis not present

## 2013-11-07 DIAGNOSIS — M1710 Unilateral primary osteoarthritis, unspecified knee: Secondary | ICD-10-CM | POA: Diagnosis not present

## 2013-11-07 DIAGNOSIS — G459 Transient cerebral ischemic attack, unspecified: Secondary | ICD-10-CM

## 2013-11-07 DIAGNOSIS — R5381 Other malaise: Secondary | ICD-10-CM

## 2013-11-07 LAB — BASIC METABOLIC PANEL
BUN: 19 mg/dL (ref 6–23)
CO2: 28 meq/L (ref 19–32)
Calcium: 9.1 mg/dL (ref 8.4–10.5)
Chloride: 108 mEq/L (ref 96–112)
Creatinine, Ser: 1.3 mg/dL (ref 0.4–1.5)
GFR: 55.19 mL/min — AB (ref >60.00)
GLUCOSE: 100 mg/dL — AB (ref 70–99)
POTASSIUM: 4.6 meq/L (ref 3.5–5.1)
Sodium: 140 mEq/L (ref 135–145)

## 2013-11-07 NOTE — Progress Notes (Signed)
Pre visit review using our clinic review tool, if applicable. No additional management support is needed unless otherwise documented below in the visit note. 

## 2013-11-07 NOTE — Progress Notes (Signed)
Patient ID: Douglas [name], male   DOB: [birth year], 78 y.o.   MRN: [id number]  The patient presents for a follow-up of  chronic hypertension, memory loss, chronic dyslipidemia,controlled with medicines.     Review of Systems    Past Medical History  Diagnosis Date  . GERD 03/15/2007    erosive esophagitis  . HYPERTENSION 03/15/2007  . HYPERLIPIDEMIA 10/25/2007  . OSTEOARTHRITIS 03/15/2007    right knee  . CVA (cerebral infarction) 10/27/2010  . CEREBROVASCULAR ACCIDENT, HX OF 05/17/2007  . COLONIC POLYPS, HX OF 10/25/2007  . 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 BENIGN PROSTATIC HYPERTROPHY 08/12/2007  . PERIPHERAL VASCULAR DISEASE 10/16/2007    carotids < 70%  . TRANSIENT ISCHEMIC ATTACK 03/19/2008  . Nephrolithiasis   . Vitamin D deficiency   . Acquired deviated nasal septum   . Chronic maxillary sinusitis   . Chronic ethmoidal sinusitis   . Blood disorder   . Chronic anticoagulation   . Lupus anticoagulant disorder     initially diagnosed in 2004 after CVA  . Hiatal hernia     Family History  Problem Relation Age of Onset  . Alzheimer's disease Father   . Heart disease Father   . Hypertension Other   . Allergies    . Hearing loss    . Hypertension    . Rheum arthritis    . Stroke Paternal Grandmother   . Heart attack Maternal Grandfather   . Leukemia Mother     History   Social History  . Marital Status: Married    Spouse Name: N/A    Number of Children: N/A  . Years of Education: N/A   Occupational History  . RETIRED DISTRICT [employer]   Social History Main Topics  . Smoking status: Former Smoker -- 0.5 packs/day for 20 years    Types: Cigarettes    Quit date: 08/28/1973  . Smokeless tobacco: Former Systems developer    Types: [place] date: 08/28/1960  . Alcohol Use: Yes     Comment: social  . Drug Use: Yes    Special: Methaqualone  . Sexually Active: Yes   Other Topics Concern  . Not on file   Social History Narrative  . No narrative on  file    Allergies  Allergen Reactions  . Cefuroxime Axetil Other (See Comments)    bad taste  . Hydrochlorothiazide Other (See Comments)    unknown  . Iohexol Hives       . Lovastatin Other (See Comments)    cramps  . Niacin Other (See Comments)    burning  . Pseudoeph-Doxylamine-Dm-Apap Other (See Comments)    tremor  . Ramipril Cough  . Tramadol Hcl Other (See Comments)    hallucinating  . Viagra (Sildenafil Citrate)     Feverish feeling     Constitutional: Denies headache, fatigue and fever or abrupt weight changes.  HEENT:  Positive sore throat. Denies eye redness, eye pain, pressure behind the eyes, facial pain, nasal congestionear pain, ringing in the ears, wax buildup, runny nose or bloody nose. Respiratory: Positive cough. Denies difficulty breathing or shortness of breath.  Cardiovascular: Denies chest pain, chest tightness, palpitations or swelling in the hands or feet.   No other specific complaints in a complete review of systems (except as listed in HPI above).  Objective:    General: Appears his stated age, well developed, well nourished in NAD. HEENT: Head: normal shape and size; Eyes: sclera white, no icterus, conjunctiva pink, PERRLA  and EOMs intact; Ears: Tm's gray and intact, normal light reflex; Nose: mucosa pink and moist, septum midline; Throat/Mouth: + PND. Teeth present, mucosa pink and moist, no exudate noted, no lesions or ulcerations noted.  Neck: Neck supple, trachea midline. No massses, lumps or thyromegaly present.  Cardiovascular: Normal rate and rhythm. S1,S2 noted.  No murmur, rubs or gallops noted. No JVD or BLE edema. No carotid bruits noted. Pulmonary/Chest: Normal effort and positive vesicular breath sounds. No respiratory distress. No wheezes, rales or ronchi noted.   Lab Results  Component Value Date   WBC 4.9 08/02/2013   HGB 14.2 08/02/2013   HCT 41.2 08/02/2013   PLT 128* 08/02/2013   GLUCOSE 114* 08/02/2013   CHOL 168 04/18/2013    TRIG 197.0* 04/18/2013   HDL 34.00* 04/18/2013   LDLDIRECT 137.9 05/11/2010   LDLCALC 95 04/18/2013   ALT 18 04/18/2013   AST 20 04/18/2013   NA 137 08/02/2013   K 4.2 08/02/2013   CL 102 08/02/2013   CREATININE 1.19 08/02/2013   BUN 23 08/02/2013   CO2 25 08/02/2013   TSH 2.87 04/18/2013   PSA 1.61 10/18/2012   INR 3.1 11/06/2013      Assessment & Plan:

## 2013-11-07 NOTE — Assessment & Plan Note (Signed)
Continue with current prescription therapy as reflected on the Med list.  

## 2013-11-07 NOTE — Assessment & Plan Note (Signed)
F/u w ortho

## 2013-11-07 NOTE — Assessment & Plan Note (Signed)
We can use cortisone shots

## 2013-11-07 NOTE — Assessment & Plan Note (Signed)
No relapse 

## 2013-11-07 NOTE — Assessment & Plan Note (Signed)
better 

## 2013-12-11 ENCOUNTER — Ambulatory Visit (INDEPENDENT_AMBULATORY_CARE_PROVIDER_SITE_OTHER): Payer: Medicare Other | Admitting: *Deleted

## 2013-12-11 DIAGNOSIS — Z8679 Personal history of other diseases of the circulatory system: Secondary | ICD-10-CM | POA: Diagnosis not present

## 2013-12-11 DIAGNOSIS — G459 Transient cerebral ischemic attack, unspecified: Secondary | ICD-10-CM

## 2013-12-11 DIAGNOSIS — I639 Cerebral infarction, unspecified: Secondary | ICD-10-CM

## 2013-12-11 DIAGNOSIS — I6350 Cerebral infarction due to unspecified occlusion or stenosis of unspecified cerebral artery: Secondary | ICD-10-CM | POA: Diagnosis not present

## 2013-12-11 DIAGNOSIS — Z5181 Encounter for therapeutic drug level monitoring: Secondary | ICD-10-CM | POA: Diagnosis not present

## 2013-12-11 LAB — POCT INR: INR: 2.2

## 2013-12-11 NOTE — Patient Instructions (Signed)
1. Call Dr Alain Marion office let his nurse know INR today. See if they want you off Coumadin for Knee Cortisone injection?  2. If they want you off Coumadin have them call to get OK from Dr Johnsie Cancel at [phone].  3. If they want INR the morning before the injection, call at [phone] and make a Coumadin clinic appt.

## 2013-12-19 ENCOUNTER — Encounter: Payer: Self-pay | Admitting: Internal Medicine

## 2013-12-19 ENCOUNTER — Ambulatory Visit (INDEPENDENT_AMBULATORY_CARE_PROVIDER_SITE_OTHER): Payer: Medicare Other | Admitting: Internal Medicine

## 2013-12-19 ENCOUNTER — Ambulatory Visit (INDEPENDENT_AMBULATORY_CARE_PROVIDER_SITE_OTHER): Payer: Medicare Other

## 2013-12-19 VITALS — BP 138/74 | HR 72 | Temp 96.7°F | Resp 16 | Wt 173.0 lb

## 2013-12-19 DIAGNOSIS — I6350 Cerebral infarction due to unspecified occlusion or stenosis of unspecified cerebral artery: Secondary | ICD-10-CM

## 2013-12-19 DIAGNOSIS — I639 Cerebral infarction, unspecified: Secondary | ICD-10-CM

## 2013-12-19 DIAGNOSIS — Z5181 Encounter for therapeutic drug level monitoring: Secondary | ICD-10-CM | POA: Diagnosis not present

## 2013-12-19 DIAGNOSIS — R269 Unspecified abnormalities of gait and mobility: Secondary | ICD-10-CM

## 2013-12-19 DIAGNOSIS — G8929 Other chronic pain: Secondary | ICD-10-CM | POA: Diagnosis not present

## 2013-12-19 DIAGNOSIS — Z8679 Personal history of other diseases of the circulatory system: Secondary | ICD-10-CM

## 2013-12-19 DIAGNOSIS — G459 Transient cerebral ischemic attack, unspecified: Secondary | ICD-10-CM

## 2013-12-19 DIAGNOSIS — M25569 Pain in unspecified knee: Secondary | ICD-10-CM

## 2013-12-19 LAB — POCT INR: INR: 2.2

## 2013-12-19 MED ORDER — METHYLPREDNISOLONE ACETATE 80 MG/ML IJ SUSP: 80.0000 mg | Freq: Once | INTRAMUSCULAR | Status: AC

## 2013-12-19 NOTE — Progress Notes (Signed)
C/o R knee pain - severe, wants injection  The patient presents for a follow-up of  chronic hypertension, memory loss, chronic dyslipidemia,controlled with medicines.     Review of Systems    Past Medical History  Diagnosis Date  . GERD 03/15/2007    erosive esophagitis  . HYPERTENSION 03/15/2007  . HYPERLIPIDEMIA 10/25/2007  . OSTEOARTHRITIS 03/15/2007    right knee  . CVA (cerebral infarction) 10/27/2010  . CEREBROVASCULAR ACCIDENT, HX OF 05/17/2007  . COLONIC POLYPS, HX OF 10/25/2007  . 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 BENIGN PROSTATIC HYPERTROPHY 08/12/2007  . PERIPHERAL VASCULAR DISEASE 10/16/2007    carotids < 70%  . TRANSIENT ISCHEMIC ATTACK 03/19/2008  . Nephrolithiasis   . Vitamin D deficiency   . Acquired deviated nasal septum   . Chronic maxillary sinusitis   . Chronic ethmoidal sinusitis   . Blood disorder   . Chronic anticoagulation   . Lupus anticoagulant disorder     initially diagnosed in 2004 after CVA  . Hiatal hernia     Family History  Problem Relation Age of Onset  . Alzheimer's disease Father   . Heart disease Father   . Hypertension Other   . Allergies    . Hearing loss    . Hypertension    . Rheum arthritis    . Stroke Paternal Grandmother   . Heart attack Maternal Grandfather   . Leukemia Mother     History   Social History  . Marital Status: Married    Spouse Name: N/A    Number of Children: N/A  . Years of Education: N/A   Occupational History  . RETIRED DISTRICT [employer]   Social History Main Topics  . Smoking status: Former Smoker -- 0.5 packs/day for 20 years    Types: Cigarettes    Quit date: 08/28/1973  . Smokeless tobacco: Former Systems developer    Types: [place] date: 08/28/1960  . Alcohol Use: Yes     Comment: social  . Drug Use: Yes    Special: Methaqualone  . Sexually Active: Yes   Other Topics Concern  . Not on file   Social History Narrative  . No narrative on file    Allergies  Allergen  Reactions  . Cefuroxime Axetil Other (See Comments)    bad taste  . Hydrochlorothiazide Other (See Comments)    unknown  . Iohexol Hives       . Lovastatin Other (See Comments)    cramps  . Niacin Other (See Comments)    burning  . Pseudoeph-Doxylamine-Dm-Apap Other (See Comments)    tremor  . Ramipril Cough  . Tramadol Hcl Other (See Comments)    hallucinating  . Viagra (Sildenafil Citrate)     Feverish feeling     Constitutional: Denies headache, fatigue and fever or abrupt weight changes.  HEENT:  Positive sore throat. Denies eye redness, eye pain, pressure behind the eyes, facial pain, nasal congestionear pain, ringing in the ears, wax buildup, runny nose or bloody nose. Respiratory: Positive cough. Denies difficulty breathing or shortness of breath.  Cardiovascular: Denies chest pain, chest tightness, palpitations or swelling in the hands or feet.   No other specific complaints in a complete review of systems (except as listed in HPI above).  Objective:    General: Appears his stated age, well developed, well nourished in NAD. HEENT: Head: normal shape and size; Eyes: sclera white, no icterus, conjunctiva pink, PERRLA and EOMs intact; Ears: Tm's gray and intact,  normal light reflex; Nose: mucosa pink and moist, septum midline; Throat/Mouth: + PND. Teeth present, mucosa pink and moist, no exudate noted, no lesions or ulcerations noted.  Neck: Neck supple, trachea midline. No massses, lumps or thyromegaly present.  Cardiovascular: Normal rate and rhythm. S1,S2 noted.  No murmur, rubs or gallops noted. No JVD or BLE edema. No carotid bruits noted. Pulmonary/Chest: Normal effort and positive vesicular breath sounds. No respiratory distress. No wheezes, rales or ronchi noted.  R knee is tender Lab Results  Component Value Date   WBC 4.9 08/02/2013   HGB 14.2 08/02/2013   HCT 41.2 08/02/2013   PLT 128* 08/02/2013   GLUCOSE 100* 11/07/2013   CHOL 168 04/18/2013   TRIG 197.0*  04/18/2013   HDL 34.00* 04/18/2013   LDLDIRECT 137.9 05/11/2010   LDLCALC 95 04/18/2013   ALT 18 04/18/2013   AST 20 04/18/2013   NA 140 11/07/2013   K 4.6 11/07/2013   CL 108 11/07/2013   CREATININE 1.3 11/07/2013   BUN 19 11/07/2013   CO2 28 11/07/2013   TSH 2.87 04/18/2013   PSA 1.61 10/18/2012   INR 2.2 12/19/2013      Procedure Note :     Procedure : Joint Injection, R  knee   Indication:  Joint osteoarthritis with refractory  chronic pain.   Risks including unsuccessful procedure , bleeding on coumadin, infection, bruising, skin atrophy, "steroid flare-up" and others were explained to the patient in detail as well as the benefits. Informed consent was obtained and signed.   Tthe patient was placed in a comfortable position. Lateral approach was used. Skin was prepped with Betadine and alcohol  and anesthetized a cooling spray. Then, a 5 cc syringe with a 1.5 inch long 25-gauge needle was used for a joint injection.. The needle was advanced  Into the knee joint cavity. I aspirated a small amount of intra-articular fluid to confirm correct placement of the needle and injected the joint with 5 mL of 2% lidocaine and 80 mg of Depo-Medrol .  Band-Aid was applied.   Tolerated well. Complications: None. Good pain relief following the procedure.     Assessment & Plan:

## 2013-12-19 NOTE — Assessment & Plan Note (Signed)
Douglas [name] wants an injection Risk of bleeding on Coumadin was discussed. INR 2.2 PT eval

## 2013-12-19 NOTE — Progress Notes (Signed)
Pre visit review using our clinic review tool, if applicable. No additional management support is needed unless otherwise documented below in the visit note. 

## 2013-12-19 NOTE — Patient Instructions (Signed)
Postprocedure instructions :    A Band-Aid should be left on for 12 hours. Injection therapy is not a cure itself. It is used in conjunction with other modalities. You can use nonsteroidal anti-inflammatories like ibuprofen , hot and cold compresses. Rest is recommended in the next 24 hours. You need to report immediately  if fever, chills or any signs of infection develop. 

## 2013-12-19 NOTE — Assessment & Plan Note (Signed)
Chronic OA, LBP, h/o CVA PT ref for best assisting device

## 2013-12-21 ENCOUNTER — Encounter: Payer: Self-pay | Admitting: Internal Medicine

## 2014-01-02 ENCOUNTER — Ambulatory Visit: Payer: Medicare Other | Attending: Internal Medicine

## 2014-01-02 DIAGNOSIS — R293 Abnormal posture: Secondary | ICD-10-CM | POA: Diagnosis not present

## 2014-01-02 DIAGNOSIS — IMO0001 Reserved for inherently not codable concepts without codable children: Secondary | ICD-10-CM | POA: Insufficient documentation

## 2014-01-02 DIAGNOSIS — M6281 Muscle weakness (generalized): Secondary | ICD-10-CM | POA: Insufficient documentation

## 2014-01-02 DIAGNOSIS — M2560 Stiffness of unspecified joint, not elsewhere classified: Secondary | ICD-10-CM | POA: Insufficient documentation

## 2014-01-02 DIAGNOSIS — R5381 Other malaise: Secondary | ICD-10-CM | POA: Diagnosis not present

## 2014-01-18 ENCOUNTER — Other Ambulatory Visit: Payer: Self-pay | Admitting: Internal Medicine

## 2014-01-23 ENCOUNTER — Ambulatory Visit (INDEPENDENT_AMBULATORY_CARE_PROVIDER_SITE_OTHER): Payer: Medicare Other | Admitting: *Deleted

## 2014-01-23 DIAGNOSIS — Z8679 Personal history of other diseases of the circulatory system: Secondary | ICD-10-CM | POA: Diagnosis not present

## 2014-01-23 DIAGNOSIS — G459 Transient cerebral ischemic attack, unspecified: Secondary | ICD-10-CM | POA: Diagnosis not present

## 2014-01-23 DIAGNOSIS — I639 Cerebral infarction, unspecified: Secondary | ICD-10-CM

## 2014-01-23 DIAGNOSIS — Z5181 Encounter for therapeutic drug level monitoring: Secondary | ICD-10-CM

## 2014-01-23 DIAGNOSIS — I6350 Cerebral infarction due to unspecified occlusion or stenosis of unspecified cerebral artery: Secondary | ICD-10-CM | POA: Diagnosis not present

## 2014-01-23 LAB — POCT INR: INR: 2.1

## 2014-02-06 ENCOUNTER — Ambulatory Visit (INDEPENDENT_AMBULATORY_CARE_PROVIDER_SITE_OTHER): Payer: Medicare Other | Admitting: Internal Medicine

## 2014-02-06 ENCOUNTER — Other Ambulatory Visit (INDEPENDENT_AMBULATORY_CARE_PROVIDER_SITE_OTHER): Payer: Medicare Other

## 2014-02-06 ENCOUNTER — Encounter: Payer: Self-pay | Admitting: Internal Medicine

## 2014-02-06 VITALS — BP 100/60 | HR 72 | Temp 97.7°F | Resp 16 | Wt 171.0 lb

## 2014-02-06 DIAGNOSIS — J309 Allergic rhinitis, unspecified: Secondary | ICD-10-CM

## 2014-02-06 DIAGNOSIS — R7309 Other abnormal glucose: Secondary | ICD-10-CM

## 2014-02-06 DIAGNOSIS — M545 Low back pain, unspecified: Secondary | ICD-10-CM | POA: Diagnosis not present

## 2014-02-06 DIAGNOSIS — I10 Essential (primary) hypertension: Secondary | ICD-10-CM

## 2014-02-06 DIAGNOSIS — R531 Weakness: Secondary | ICD-10-CM

## 2014-02-06 DIAGNOSIS — G459 Transient cerebral ischemic attack, unspecified: Secondary | ICD-10-CM

## 2014-02-06 DIAGNOSIS — R5381 Other malaise: Secondary | ICD-10-CM | POA: Diagnosis not present

## 2014-02-06 DIAGNOSIS — F0390 Unspecified dementia without behavioral disturbance: Secondary | ICD-10-CM

## 2014-02-06 DIAGNOSIS — R5383 Other fatigue: Secondary | ICD-10-CM | POA: Diagnosis not present

## 2014-02-06 DIAGNOSIS — R739 Hyperglycemia, unspecified: Secondary | ICD-10-CM

## 2014-02-06 DIAGNOSIS — I6350 Cerebral infarction due to unspecified occlusion or stenosis of unspecified cerebral artery: Secondary | ICD-10-CM | POA: Diagnosis not present

## 2014-02-06 DIAGNOSIS — R059 Cough, unspecified: Secondary | ICD-10-CM

## 2014-02-06 DIAGNOSIS — R0602 Shortness of breath: Secondary | ICD-10-CM

## 2014-02-06 DIAGNOSIS — R05 Cough: Secondary | ICD-10-CM

## 2014-02-06 DIAGNOSIS — R413 Other amnesia: Secondary | ICD-10-CM | POA: Diagnosis not present

## 2014-02-06 DIAGNOSIS — R269 Unspecified abnormalities of gait and mobility: Secondary | ICD-10-CM

## 2014-02-06 DIAGNOSIS — R053 Chronic cough: Secondary | ICD-10-CM

## 2014-02-06 LAB — BASIC METABOLIC PANEL
BUN: 20 mg/dL (ref 6–23)
CHLORIDE: 106 meq/L (ref 96–112)
CO2: 27 meq/L (ref 19–32)
Calcium: 8.9 mg/dL (ref 8.4–10.5)
Creatinine, Ser: 1 mg/dL (ref 0.4–1.5)
GFR: 74.72 mL/min (ref >60.00)
GLUCOSE: 97 mg/dL (ref 70–99)
POTASSIUM: 4.1 meq/L (ref 3.5–5.1)
SODIUM: 139 meq/L (ref 135–145)

## 2014-02-06 LAB — HEMOGLOBIN A1C: HEMOGLOBIN A1C: 5.8 % (ref 4.6–6.5)

## 2014-02-06 LAB — TSH: TSH: 3.06 u[IU]/mL (ref 0.35–4.50)

## 2014-02-06 MED ORDER — DICLOFENAC SODIUM 1 % TD GEL: 2.0000 g | Freq: Four times a day (QID) | TRANSDERMAL | Status: DC | PRN

## 2014-02-06 NOTE — Progress Notes (Signed)
The patient presents for a follow-up of  chronic hypertension, memory loss, chronic dyslipidemia,controlled with medicines.     Review of Systems    Past Medical History  Diagnosis Date  . GERD 03/15/2007    erosive esophagitis  . HYPERTENSION 03/15/2007  . HYPERLIPIDEMIA 10/25/2007  . OSTEOARTHRITIS 03/15/2007    right knee  . CVA (cerebral infarction) 10/27/2010  . CEREBROVASCULAR ACCIDENT, HX OF 05/17/2007  . COLONIC POLYPS, HX OF 10/25/2007  . 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 BENIGN PROSTATIC HYPERTROPHY 08/12/2007  . PERIPHERAL VASCULAR DISEASE 10/16/2007    carotids < 70%  . TRANSIENT ISCHEMIC ATTACK 03/19/2008  . Nephrolithiasis   . Vitamin D deficiency   . Acquired deviated nasal septum   . Chronic maxillary sinusitis   . Chronic ethmoidal sinusitis   . Blood disorder   . Chronic anticoagulation   . Lupus anticoagulant disorder     initially diagnosed in 2004 after CVA  . Hiatal hernia     Family History  Problem Relation Age of Onset  . Alzheimer's disease Father   . Heart disease Father   . Hypertension Other   . Allergies    . Hearing loss    . Hypertension    . Rheum arthritis    . Stroke Paternal Grandmother   . Heart attack Maternal Grandfather   . Leukemia Mother     History   Social History  . Marital Status: Married    Spouse Name: N/A    Number of Children: N/A  . Years of Education: N/A   Occupational History  . RETIRED [employer]   Social History Main Topics  . Smoking status: Former Smoker -- 0.5 packs/day for 20 years    Types: Cigarettes    Quit date: 08/28/1973  . Smokeless tobacco: Former Systems developer    Types: [place] date: 08/28/1960  . Alcohol Use: Yes     Comment: social  . Drug Use: Yes    Special: Methaqualone  . Sexually Active: Yes   Other Topics Concern  . Not on file   Social History Narrative  . No narrative on file    Allergies  Allergen Reactions  . Cefuroxime Axetil Other (See Comments)     bad taste  . Hydrochlorothiazide Other (See Comments)    unknown  . Iohexol Hives       . Lovastatin Other (See Comments)    cramps  . Niacin Other (See Comments)    burning  . Pseudoeph-Doxylamine-Dm-Apap Other (See Comments)    tremor  . Ramipril Cough  . Tramadol Hcl Other (See Comments)    hallucinating  . Viagra (Sildenafil Citrate)     Feverish feeling     Constitutional: Denies headache, fatigue and fever or abrupt weight changes.  HEENT:  Positive sore throat. Denies eye redness, eye pain, pressure behind the eyes, facial pain, nasal congestionear pain, ringing in the ears, wax buildup, runny nose or bloody nose. Respiratory: Positive cough. Denies difficulty breathing or shortness of breath.  Cardiovascular: Denies chest pain, chest tightness, palpitations or swelling in the hands or feet.   No other specific complaints in a complete review of systems (except as listed in HPI above).  Objective:    General: Appears his stated age, well developed, well nourished in NAD. HEENT: Head: normal shape and size; Eyes: sclera white, no icterus, conjunctiva pink, PERRLA and EOMs intact; Ears: Tm's gray and intact, normal light reflex; Nose: mucosa pink and moist, septum  midline; Throat/Mouth: + PND. Teeth present, mucosa pink and moist, no exudate noted, no lesions or ulcerations noted.  Neck: Neck supple, trachea midline. No massses, lumps or thyromegaly present.  Cardiovascular: Normal rate and rhythm. S1,S2 noted.  No murmur, rubs or gallops noted. No JVD or BLE edema. No carotid bruits noted. Pulmonary/Chest: Normal effort and positive vesicular breath sounds. No respiratory distress. No wheezes, rales or ronchi noted.   Lab Results  Component Value Date   WBC 4.9 08/02/2013   HGB 14.2 08/02/2013   HCT 41.2 08/02/2013   PLT 128* 08/02/2013   GLUCOSE 100* 11/07/2013   CHOL 168 04/18/2013   TRIG 197.0* 04/18/2013   HDL 34.00* 04/18/2013   LDLDIRECT 137.9 05/11/2010    LDLCALC 95 04/18/2013   ALT 18 04/18/2013   AST 20 04/18/2013   NA 140 11/07/2013   K 4.6 11/07/2013   CL 108 11/07/2013   CREATININE 1.3 11/07/2013   BUN 19 11/07/2013   CO2 28 11/07/2013   TSH 2.87 04/18/2013   PSA 1.61 10/18/2012   INR 2.1 01/23/2014      Assessment & Plan:

## 2014-02-06 NOTE — Assessment & Plan Note (Signed)
Continue with current prescription therapy as reflected on the Med list.  

## 2014-02-06 NOTE — Progress Notes (Signed)
Pre visit review using our clinic review tool, if applicable. No additional management support is needed unless otherwise documented below in the visit note. 

## 2014-02-06 NOTE — Patient Instructions (Signed)
Wt Readings from Last 3 Encounters:  02/06/14 171 lb (77.565 kg)  12/19/13 173 lb (78.472 kg)  11/07/13 175 lb (79.379 kg)

## 2014-02-15 ENCOUNTER — Other Ambulatory Visit: Payer: Self-pay | Admitting: Cardiovascular Disease

## 2014-03-06 ENCOUNTER — Ambulatory Visit (INDEPENDENT_AMBULATORY_CARE_PROVIDER_SITE_OTHER): Payer: Medicare Other

## 2014-03-06 DIAGNOSIS — Z8679 Personal history of other diseases of the circulatory system: Secondary | ICD-10-CM | POA: Diagnosis not present

## 2014-03-06 DIAGNOSIS — I639 Cerebral infarction, unspecified: Secondary | ICD-10-CM

## 2014-03-06 DIAGNOSIS — I6350 Cerebral infarction due to unspecified occlusion or stenosis of unspecified cerebral artery: Secondary | ICD-10-CM | POA: Diagnosis not present

## 2014-03-06 DIAGNOSIS — G459 Transient cerebral ischemic attack, unspecified: Secondary | ICD-10-CM

## 2014-03-06 DIAGNOSIS — Z5181 Encounter for therapeutic drug level monitoring: Secondary | ICD-10-CM | POA: Diagnosis not present

## 2014-03-06 LAB — POCT INR: INR: 3.1

## 2014-03-08 ENCOUNTER — Other Ambulatory Visit: Payer: Self-pay | Admitting: Internal Medicine

## 2014-03-10 ENCOUNTER — Encounter: Payer: Self-pay | Admitting: Internal Medicine

## 2014-03-10 ENCOUNTER — Ambulatory Visit (INDEPENDENT_AMBULATORY_CARE_PROVIDER_SITE_OTHER): Payer: Medicare Other | Admitting: Internal Medicine

## 2014-03-10 VITALS — BP 120/64 | HR 72 | Ht 65.0 in | Wt 174.0 lb

## 2014-03-10 DIAGNOSIS — K625 Hemorrhage of anus and rectum: Secondary | ICD-10-CM

## 2014-03-10 DIAGNOSIS — K5900 Constipation, unspecified: Secondary | ICD-10-CM | POA: Diagnosis not present

## 2014-03-10 DIAGNOSIS — I6350 Cerebral infarction due to unspecified occlusion or stenosis of unspecified cerebral artery: Secondary | ICD-10-CM

## 2014-03-10 DIAGNOSIS — R109 Unspecified abdominal pain: Secondary | ICD-10-CM

## 2014-03-10 NOTE — Progress Notes (Signed)
HISTORY OF PRESENT ILLNESS:  Douglas [name] is a 78 y.o. male with multiple significant medical problems as listed below. He is on chronic anticoagulation. He presents today with complaints of chronic constipation associated with burning lower abdominal discomfort and minor intermittent rectal bleeding. He is accompanied by his daughter. Patient has a history of adenomatous colon polyps and has undergone prior colonoscopy in 2002, 2007, and most recently 09/20/2012. His most recent examination was normal except for the presence of a diminutive transverse colon polyp which was removed and found to be a tubular adenoma. No routine followup recommended secondary to age and comorbidities. Patient reports that he has had his current complaints for greater than one year. Reviewing the old records finds a CT scan from August 2008 being performed for abdominal discomfort, tenderness, and constipation. No acute abnormalities. More recently he reports that his bowels have been doing better spontaneously. He is take a number of agents for constipation. Rectal bleeding has been exclusively associated with constipated bowel movement and rectal burning discomfort. He has had no problems for a few weeks. His appetite is good and weight stable. No urinary complaints. He states that these symptoms are not interfering with lifestyle, but rather worry him. They chronic lower abdominal burning discomfort is not affected by meals, activity, or defecation. He also has a history of GERD for which she takes omeprazole 40 mg twice a day. Prior upper endoscopy in 2002 revealed reflux esophagitis as well as gastroduodenitis. No active GERD symptoms at present  REVIEW OF SYSTEMS:  All non-GI ROS negative except for arthritis  Past Medical History  Diagnosis Date  . GERD 03/15/2007    erosive esophagitis  . HYPERTENSION 03/15/2007  . HYPERLIPIDEMIA 10/25/2007  . OSTEOARTHRITIS 03/15/2007    right knee  . CVA (cerebral  infarction) 10/27/2010  . CEREBROVASCULAR ACCIDENT, HX OF 05/17/2007  . COLONIC POLYPS, HX OF 10/25/2007  . 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 BENIGN PROSTATIC HYPERTROPHY 08/12/2007  . PERIPHERAL VASCULAR DISEASE 10/16/2007    carotids < 70%  . TRANSIENT ISCHEMIC ATTACK 03/19/2008  . Nephrolithiasis   . Vitamin D deficiency   . Acquired deviated nasal septum   . Chronic maxillary sinusitis   . Chronic ethmoidal sinusitis   . Blood disorder   . Chronic anticoagulation   . Lupus anticoagulant disorder     initially diagnosed in 2004 after CVA  . Hiatal hernia   . Stroke   . Allergy   . Clotting disorder     clotting disorder    Past Surgical History  Procedure Laterality Date  . Lung biopsy  1980    s/p  . Nasal sinus surgery      s/p   . Lithotripsy      s/p  . Rotator cuff repair      x 2  . Nasal septal deviation repair    . Colonoscopy      Social History Douglas [name]  reports that he quit smoking about 40 years ago. His smoking use included Cigarettes. He has a 10 pack-year smoking history. He quit smokeless tobacco use about 53 years ago. His smokeless tobacco use included Chew. He reports that he drinks alcohol. He reports that he uses illicit drugs (Methaqualone).  family history includes Allergies in an other family member; Alzheimer's disease in his father; Hearing loss in an other family member; Heart attack in his maternal grandfather; Heart disease in his father; Hypertension in his other and another family member; Leukemia in  his mother; Rheum arthritis in an other family member; Stroke in his paternal grandmother. There is no history of Colon cancer, Esophageal cancer, Stomach cancer, or Rectal cancer.  Allergies  Allergen Reactions  . Cefuroxime Axetil Other (See Comments)    bad taste  . Hydrochlorothiazide Other (See Comments)    unknown  . Iohexol Hives       . Lovastatin Other (See Comments)    cramps  . Niacin Other (See Comments)     burning  . Pseudoeph-Doxylamine-Dm-Apap Other (See Comments)    tremor  . Ramipril Cough  . Tramadol Hcl Other (See Comments)    hallucinating  . Viagra [Sildenafil Citrate]     Feverish feeling       PHYSICAL EXAMINATION: Vital signs: BP 120/64  Pulse 72  Ht 5\' 5"  (1.651 m)  Wt 174 lb (78.926 kg)  BMI 28.96 kg/m2 General: Well-developed, well-nourished elderly male, no acute distress HEENT: Sclerae are anicteric, conjunctiva pink. Oral mucosa intact Lungs: Clear Heart: Regular Abdomen: soft, nontender, nondistended, no obvious ascites, no peritoneal signs, normal bowel sounds. No organomegaly. Extremities: No edema Psychiatric: alert and oriented x3. Cooperative   ASSESSMENT:  #1. Constipation. Functional #2. Minor intermittent rectal bleeding. Secondary to fissure based on description and negative recent colonoscopy #3. Chronic vague lower abdominal discomfort. No alarm features. Has been evaluated in the past without significant abnormality #4. History of adenomatous polyps. He has aged out of surveillance #5. GERD. Asymptomatic on PPI #6. Multiple medical problems.   PLAN:  #1. MiraLax as needed for constipation. Discussed with he and his daughter the appropriate way to use MiraLax #2. Reassurance #3. Continue reflux precautions and PPI #4. Return to the care of your PCP. GI followup as needed

## 2014-03-10 NOTE — Patient Instructions (Signed)
Please follow up with Dr. Perry as needed 

## 2014-03-20 ENCOUNTER — Ambulatory Visit (INDEPENDENT_AMBULATORY_CARE_PROVIDER_SITE_OTHER): Payer: Medicare Other | Admitting: Neurology

## 2014-03-20 ENCOUNTER — Encounter: Payer: Self-pay | Admitting: Neurology

## 2014-03-20 VITALS — BP 105/55 | HR 61 | Temp 97.8°F | Ht 65.0 in | Wt 176.0 lb

## 2014-03-20 DIAGNOSIS — F0280 Dementia in other diseases classified elsewhere without behavioral disturbance: Secondary | ICD-10-CM | POA: Diagnosis not present

## 2014-03-20 DIAGNOSIS — G309 Alzheimer's disease, unspecified: Principal | ICD-10-CM

## 2014-03-20 DIAGNOSIS — I6350 Cerebral infarction due to unspecified occlusion or stenosis of unspecified cerebral artery: Secondary | ICD-10-CM | POA: Diagnosis not present

## 2014-03-20 DIAGNOSIS — Z8673 Personal history of transient ischemic attack (TIA), and cerebral infarction without residual deficits: Secondary | ICD-10-CM | POA: Diagnosis not present

## 2014-03-20 MED ORDER — DONEPEZIL HCL 10 MG PO TABS: ORAL_TABLET | ORAL | Status: DC

## 2014-03-20 MED ORDER — MEMANTINE HCL ER 28 MG PO CP24: 1.0000 | ORAL_CAPSULE | Freq: Every day | ORAL | Status: DC

## 2014-03-20 NOTE — Progress Notes (Signed)
Subjective:    Patient ID: Douglas [name] is a 77 y.o. male.  HPI    Interim history:   Douglas [name] is a very pleasant 77-year-old right-handed gentleman with an underlying medical history of hypertension, kidney stones, lupus anticoagulant, hyperlipidemia, left-sided stroke in 2004, vitamin D deficiency, reflux disease, prostate hypertrophy, colonic polyps, right knee pain and status post rotator cuff surgery bilaterally, who presents for followup consultation of his Alzheimer's dementia. He is accompanied by his wife Douglas [name] again today. I last saw him on 10/24/2013, at which time I asked him to continue with Namenda long-acting 28 mg daily and Aricept 10 mg daily.  Today, he reports doing well overall, but his knees bother him. He had a steroid injection into the R knee in 4/15, which helped some. He saw Dr. Landau. His middle daughter (adoptive) died of lung cancer at age 43 in March. His youngest daughter, Douglas [name], is getting married, the oldest Daughter, Douglas [name], lives in [place].   I saw him on 04/18/2013, at which time I increased his Namenda XR to 21 mg daily. However they were not able to fill it because of shortage of the medication and he has been on Namenda XR 28 mg since November 2014, per PCP.  I first met him on 01/10/2013, at which time his MMSE was 21, clock drawing was 2, animal fluency was 12. I felt that he most likely has Alzheimer's dementia. He had some decline in his MMSE score. I asked him to start Namenda XR 7 mg once daily for one month then 14 mg once daily thereafter. I also asked him to see Dr. Zelson for neurocognitive testing. He had that consultation in July 2014: In essence, findings were conclusive with mild Alzheimer's dementia. He did well with the addition of Namenda. He was tolerating it well and his wife was particularly pleased to see that he was thinking more clearly and was less irritable and less angry all the time. It had made a significant  difference in his cognitive functioning, they both agreed and he reported no SEs.  He previously followed with Dr. James Love and was last seen by him on 07/19/2012, which time Dr. Love felt the patient had mild dementia and discussed dementia related studies. While he did not change his medication he asked the patient to take his generic Aricept during the day rather than later at night.  He has an approximately 6 year history of memory loss, particularly forgetfulness. MRI brain with and without contrast in February 2009 showed an old infarct in the left occipital lobe, mild atrophy, and moderate small vessel disease. CT head without contrast in July 2009 and March 2012 showed no other new abnormalities. Vitamin B12 level and TSH were normal in March 2012. The patient is exercising regularly. He is independent in his daily living and drives a car. She has some depression but is not on an antidepressant. In August 2012 his MMSE was 27, clock drawing was 4, animal fluency was 14. In February 2013 his MMSE was 23, clock drawing was 4, animal fluency was 12. In July 2013 his MMSE was 23, clock drawing was 3, animal fluency was 8.   His Past Medical History Is Significant For: Past Medical History  Diagnosis Date  . GERD 03/15/2007    erosive esophagitis  . HYPERTENSION 03/15/2007  . HYPERLIPIDEMIA 10/25/2007  . OSTEOARTHRITIS 03/15/2007    right knee  . CVA (cerebral infarction) 10/27/2010  . CEREBROVASCULAR ACCIDENT, HX OF 05/17/2007  .   COLONIC POLYPS, HX OF 10/25/2007  . 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 BENIGN PROSTATIC HYPERTROPHY 08/12/2007  . PERIPHERAL VASCULAR DISEASE 10/16/2007    carotids < 70%  . TRANSIENT ISCHEMIC ATTACK 03/19/2008  . Nephrolithiasis   . Vitamin D deficiency   . Acquired deviated nasal septum   . Chronic maxillary sinusitis   . Chronic ethmoidal sinusitis   . Blood disorder   . Chronic anticoagulation   . Lupus anticoagulant disorder     initially diagnosed in  2004 after CVA  . Hiatal hernia   . Stroke   . Allergy   . Clotting disorder     clotting disorder    His Past Surgical History Is Significant For: Past Surgical History  Procedure Laterality Date  . Lung biopsy  1980    s/p  . Nasal sinus surgery      s/p   . Lithotripsy      s/p  . Rotator cuff repair      x 2  . Nasal septal deviation repair    . Colonoscopy      His Family History Is Significant For: Family History  Problem Relation Age of Onset  . Alzheimer's disease Father   . Heart disease Father   . Hypertension Other   . Allergies    . Hearing loss    . Hypertension    . Rheum arthritis    . Stroke Paternal Grandmother   . Heart attack Maternal Grandfather   . Leukemia Mother   . Colon cancer Neg Hx   . Esophageal cancer Neg Hx   . Stomach cancer Neg Hx   . Rectal cancer Neg Hx     His Social History Is Significant For: History   Social History  . Marital Status: Married    Spouse Name: N/A    Number of Children: N/A  . Years of Education: N/A   Occupational History  . RETIRED DISTRICT [employer]   Social History Main Topics  . Smoking status: Former Smoker -- 0.50 packs/day for 20 years    Types: Cigarettes    Quit date: 08/28/1973  . Smokeless tobacco: Former User    Types: Chew    Quit date: 08/28/1960  . Alcohol Use: Yes     Comment: social  . Drug Use: Yes    Special: Methaqualone  . Sexual Activity: Yes   Other Topics Concern  . None   Social History Narrative  . None    His Allergies Are:  Allergies  Allergen Reactions  . Cefuroxime Axetil Other (See Comments)    bad taste  . Hydrochlorothiazide Other (See Comments)    unknown  . Iohexol Hives       . Lovastatin Other (See Comments)    cramps  . Niacin Other (See Comments)    burning  . Pseudoeph-Doxylamine-Dm-Apap Other (See Comments)    tremor  . Ramipril Cough  . Tramadol Hcl Other (See Comments)    hallucinating  . Viagra [Sildenafil Citrate]      Feverish feeling  :   His Current Medications Are:  Outpatient Encounter Prescriptions as of 03/20/2014  Medication Sig  . Aclidinium Bromide (TUDORZA PRESSAIR) 400 MCG/ACT AEPB Inhale 1 Act into the lungs 2 (two) times daily.  . albuterol (PROAIR HFA) 108 (90 BASE) MCG/ACT inhaler Inhale 2 puffs into the lungs 4 (four) times daily as needed. For wheezing  . aspirin EC 81 MG tablet Take 81 mg by mouth daily.  . cholecalciferol (  VITAMIN D) 1000 UNITS tablet Take 1,000 Units by mouth daily.    . cloNIDine (CATAPRES) 0.1 MG tablet Take 0.1 mg by mouth 2 (two) times daily.  . diclofenac sodium (VOLTAREN) 1 % GEL Apply 2 g topically 4 (four) times daily as needed. To both knees prn  . donepezil (ARICEPT) 10 MG tablet TAKE 1 TABLET (10 MG TOTAL) BY MOUTH EVERY MORNING.  . fluticasone (FLONASE) 50 MCG/ACT nasal spray Place 2 sprays into the nose daily.  . folic acid (FOLVITE) 800 MCG tablet Take 800 mcg by mouth daily.  . loratadine (CLARITIN) 10 MG tablet Take 1 tablet (10 mg total) by mouth daily.  . losartan (COZAAR) 100 MG tablet Take 1 tablet (100 mg total) by mouth daily.  . Memantine HCl ER (NAMENDA XR) 28 MG CP24 Take 28 mg by mouth daily.  . omeprazole (PRILOSEC) 40 MG capsule TAKE ONE CAPSULE BY MOUTH TWICE DAILY  . tamsulosin (FLOMAX) 0.4 MG CAPS capsule TAKE 1 CAPSULE (0.4 MG TOTAL) BY MOUTH DAILY.  . verapamil (CALAN-SR) 180 MG CR tablet Take 1 tablet (180 mg total) by mouth daily.  . warfarin (COUMADIN) 2.5 MG tablet 2 tablets daily except 1 tablet on Mondays, Wednesdays, and Fridays or as directed by coumadin clinic  . ipratropium (ATROVENT) 0.06 % nasal spray Place 2 sprays into the nose 4 (four) times daily.  . [DISCONTINUED] benzonatate (TESSALON) 100 MG capsule Take 1 capsule (100 mg total) by mouth 3 (three) times daily as needed for cough.  :  Review of Systems:  Out of a complete 14 point review of systems, all are reviewed and negative with the exception of these symptoms as  listed below:   Review of Systems  Constitutional: Positive for fatigue.  HENT: Positive for rhinorrhea.   Eyes: Positive for itching.  Respiratory: Positive for cough.   Cardiovascular: Negative.   Gastrointestinal: Positive for constipation.  Endocrine: Negative.   Genitourinary: Negative.   Musculoskeletal: Positive for arthralgias, back pain and gait problem.  Skin: Negative.   Allergic/Immunologic: Positive for food allergies.  Neurological: Positive for headaches.       Memory loss  Hematological: Negative.   Psychiatric/Behavioral: Positive for confusion and sleep disturbance (eds, snoring, sleep talking).    Objective:  Neurologic Exam  Physical Exam Physical Examination:   Filed Vitals:   03/20/14 1207  BP: 105/55  Pulse: 61  Temp: 97.8 F (36.6 C)   General Examination: The patient is a very pleasant 77 y.o. male in no acute distress. He is calm and cooperative with the exam. He denies Auditory Hallucinations and Visual Hallucinations.   HEENT: Normocephalic, atraumatic, pupils are equal, round and reactive to light and accommodation. Funduscopic exam is normal with sharp disc margins noted. Extraocular tracking shows mild saccadic breakdown without nystagmus noted. Hearing is intact. Face is symmetric with no facial masking and normal facial sensation. There is no lip, neck or jaw tremor. Neck is not rigid with intact passive ROM. There are no carotid bruits on auscultation. Oropharynx exam reveals mild mouth dryness. No significant airway crowding is noted. Mallampati is class II. Tongue protrudes centrally and palate elevates symmetrically.    Chest: is clear to auscultation without wheezing, rhonchi or crackles noted.  Heart: sounds are regular and normal without murmurs, rubs or gallops noted.   Abdomen: is soft, non-tender and non-distended with normal bowel sounds appreciated on auscultation.  Extremities: There is no pitting edema in the distal lower  extremities bilaterally.   Skin: is   warm and dry with no trophic changes noted.  Musculoskeletal: exam reveals no obvious joint deformities, tenderness or joint swelling or erythema, except b/l knee pain.  Neurologically:  Mental status: The patient is awake and alert, paying good  attention. He is able to partially provide the history. His daughter provides details. He is oriented to: person, place, situation and day of week.  His memory, attention, language and knowledge are impaired. MMSE: 21/30, CDT: 1/4, and AFT: 8/min.  There is no aphasia, agnosia, apraxia or anomia. There is a mild degree of bradyphrenia. Speech is not hypophonic with no dysarthria noted. Mood is congruent and affect is normal.   His MMSE score in May 2014 was 21/30, CDT 2/4, AFT (Animal Fluency Test) score was 12.   03/20/2014: MMSE: 20/30, CDT: 1/4, AFT: 11   Cranial nerves are as described above under HEENT exam. In addition, shoulder shrug is normal with equal shoulder height noted.  Motor exam: Normal bulk, and strength for age is noted. Tone is not rigid with absence of cogwheeling. There is overall no bradykinesia. There is no drift or rebound. There is no tremor. Romberg is negative. Reflexes are 1+ in the upper extremities and 1+ in the lower extremities. Toes are downgoing bilaterally. Fine motor skills: Finger taps, hand movements, and rapid alternating patting are mildly impaired bilaterally. Foot taps and foot agility are mildly impaired bilaterally.   Cerebellar testing shows no dysmetria or intention tremor on finger to nose testing. Heel to shin is difficult for him. There is no truncal or gait ataxia.   Sensory exam is intact to light touch, pinprick, vibration, temperature sense in the upper and lower extremities.   Gait, station and balance: He stands up from the seated position with no difficulty and needs no assistance. No veering to one side is noted. No leaning to one side. Posture is not stooped.  Stance is wide-based. He turns in 3 steps. Tandem walk is not possible. Balance is mildly impaired. He has a single point cane.   Assessment and Plan:   In summary, Douglas [name] is a very pleasant 77-year old male with an underlying medical history of hypertension, kidney stones, lupus anticoagulant (on coumadin), hyperlipidemia, left-sided stroke in 2004, vitamin D deficiency, reflux disease, prostate hypertrophy, colonic polyps, right knee pain and status post rotator cuff surgery bilaterally, who presents for followup consultation of his Alzheimer's dementia. His memory numbers have taken a slight decline over the past several months. He is on dual dementia therapy. He has been the medications well. He had neuropsychological testing in July 2014, and the conclusion was consistent with mild Alzheimer's dementia. I would like to repeat this sometime later this year or early next year. At this juncture I think we should continue with Namenda XR 28 mg and continue with Aricept 10 mg daily. I have asked him to stay active mentally and physically. I have asked him to drink more water. I would like to see him back in 4 months from now, sooner if the need arises. They were both in agreement. 

## 2014-03-20 NOTE — Patient Instructions (Signed)
I think overall you are doing fairly well but I do want to suggest a few things today:  Remember to drink plenty of fluid, eat healthy meals and do not skip any meals. Try to eat protein with a every meal and eat a healthy snack such as fruit or nuts in between meals. Try to keep a regular sleep-wake schedule and try to exercise daily, particularly in the form of walking, 20-30 minutes a day, if you can. Good nutrition, proper sleep and exercise can help her cognitive function.  Engage in social activities in your community and with your family and try to keep up with current events by reading the newspaper or watching the news. If you have computer and can go online, try [email or web address]. Also, you may like to do word finding puzzles or crossword puzzles.  As far as your medications are concerned, I would like to suggest  that you continue Namenda and Aricept at the current doses  As far as diagnostic testing: We will consider repeating your cognitive test later this year or maybe early next year.  I would like to see you back in 4-5 months, sooner if we need to. Please call us with any interim questions, concerns, problems, updates or refill requests.

## 2014-04-17 ENCOUNTER — Ambulatory Visit (INDEPENDENT_AMBULATORY_CARE_PROVIDER_SITE_OTHER): Payer: Medicare Other | Admitting: *Deleted

## 2014-04-17 DIAGNOSIS — H2510 Age-related nuclear cataract, unspecified eye: Secondary | ICD-10-CM | POA: Diagnosis not present

## 2014-04-17 DIAGNOSIS — I6350 Cerebral infarction due to unspecified occlusion or stenosis of unspecified cerebral artery: Secondary | ICD-10-CM

## 2014-04-17 DIAGNOSIS — G459 Transient cerebral ischemic attack, unspecified: Secondary | ICD-10-CM | POA: Diagnosis not present

## 2014-04-17 DIAGNOSIS — Z5181 Encounter for therapeutic drug level monitoring: Secondary | ICD-10-CM

## 2014-04-17 DIAGNOSIS — I639 Cerebral infarction, unspecified: Secondary | ICD-10-CM

## 2014-04-17 DIAGNOSIS — Z8679 Personal history of other diseases of the circulatory system: Secondary | ICD-10-CM | POA: Diagnosis not present

## 2014-04-17 LAB — POCT INR: INR: 2.3

## 2014-05-17 ENCOUNTER — Other Ambulatory Visit: Payer: Self-pay | Admitting: Cardiovascular Disease

## 2014-05-20 IMAGING — RF DG ESOPHAGUS
13 of 16 series · 19 of 24 positions shown · non-contrast
Comparison: None.

CLINICAL DATA: Dysphagia

ESOPHOGRAM/BARIUM SWALLOW
TECHNIQUE: Combined double contrast and single contrast
examination performed using effervescent crystals, thick barium
liquid, and thin barium liquid.
Fluoroscopy time:  1.7 minutes.

[Series 1: run · 1 of 1 slices shown (1 of 13)]
[im 1/1]
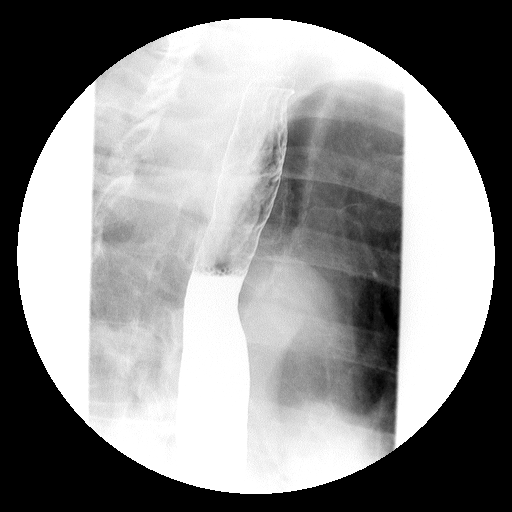

[Series 2: run · 1 of 1 slices shown (2 of 13)]
[im 1/1]
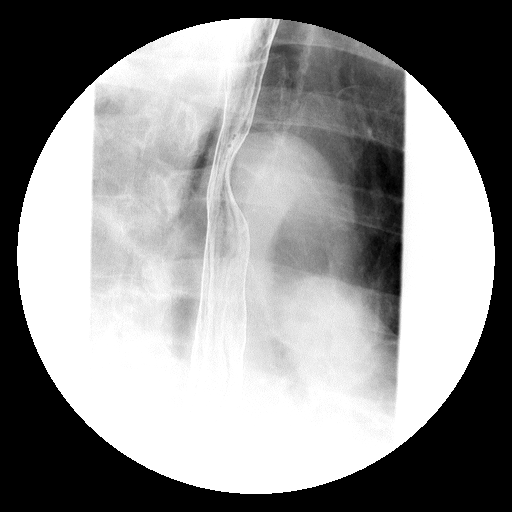

[Series 5: run · 1 of 1 slices shown (3 of 13)]
[im 1/1]
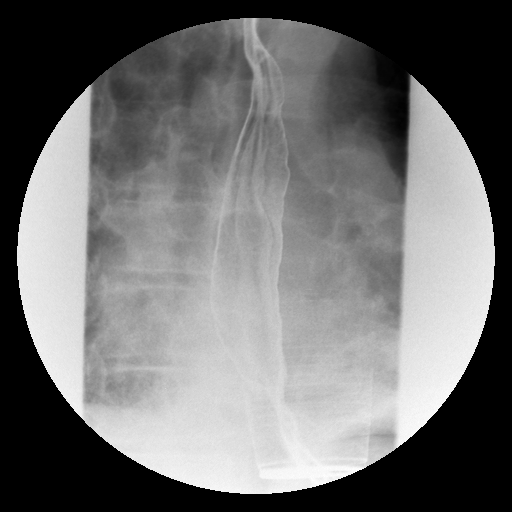

[Series 7: run · 4 of 8 slices shown (4 of 13)]
[im 1/8]
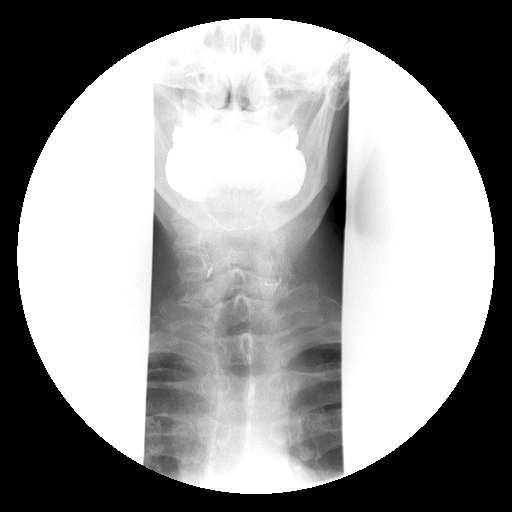
[im 2/8]
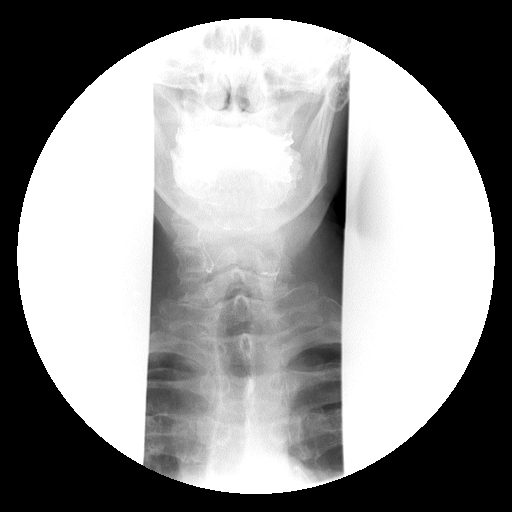
[im 4/8]
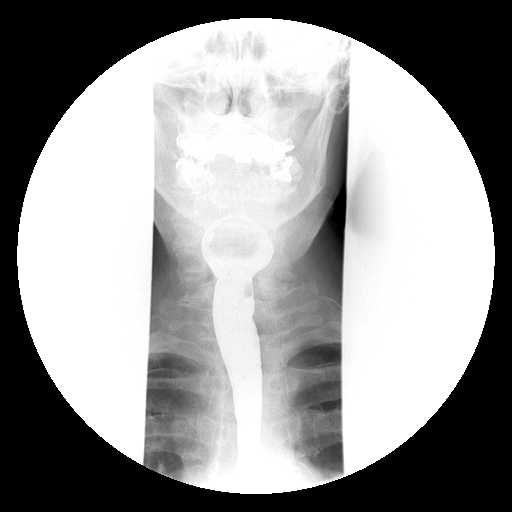
[im 8/8]
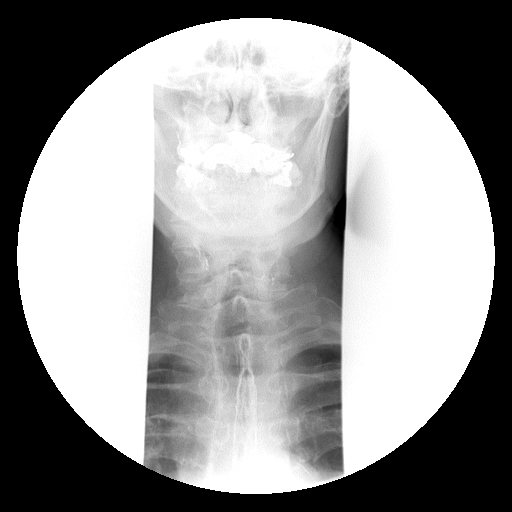

[Series 8: run · 4 of 7 slices shown (5 of 13)]
[im 1/7]
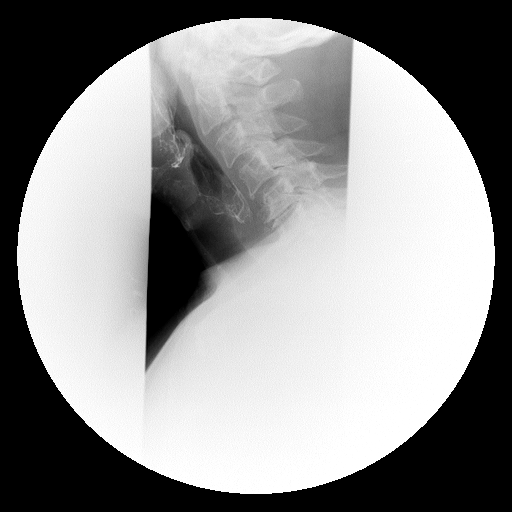
[im 2/7]
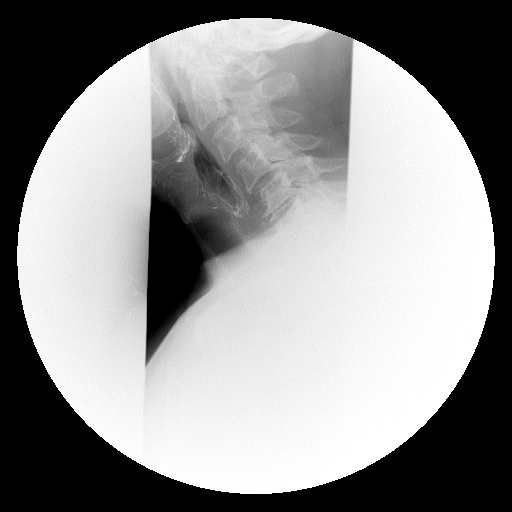
[im 5/7]
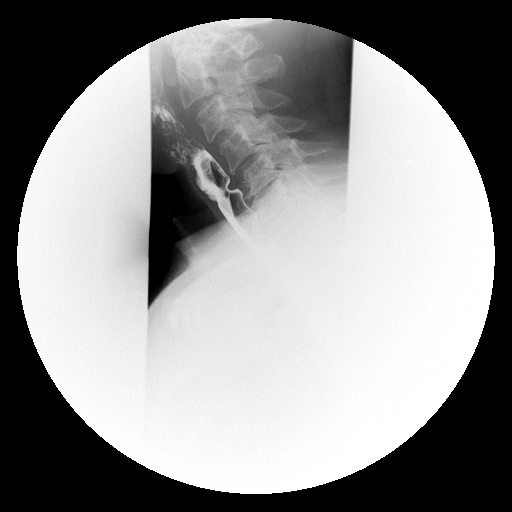
[im 7/7]
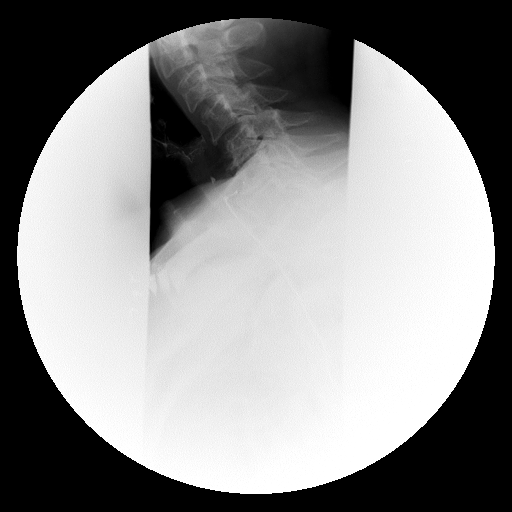

[Series 11: run · 1 of 1 slices shown (6 of 13)]
[im 1/1]
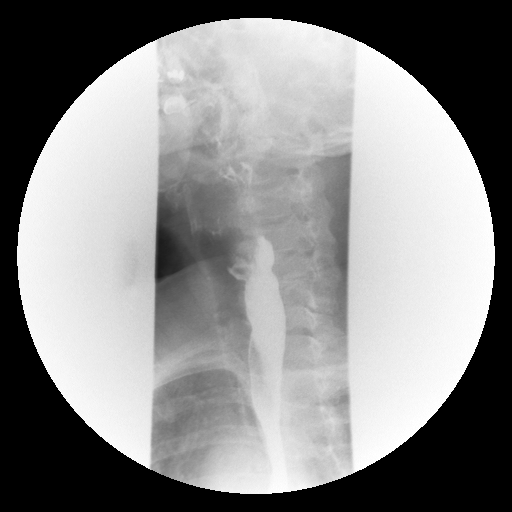

[Series 12: run · 1 of 1 slices shown (7 of 13)]
[im 1/1]
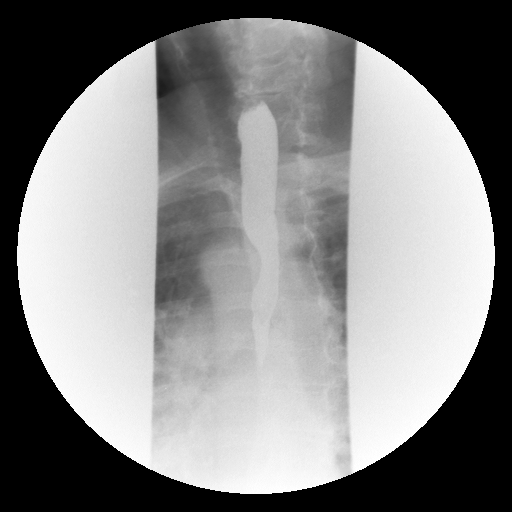

[Series 14: run · 1 of 1 slices shown (8 of 13)]
[im 1/1]
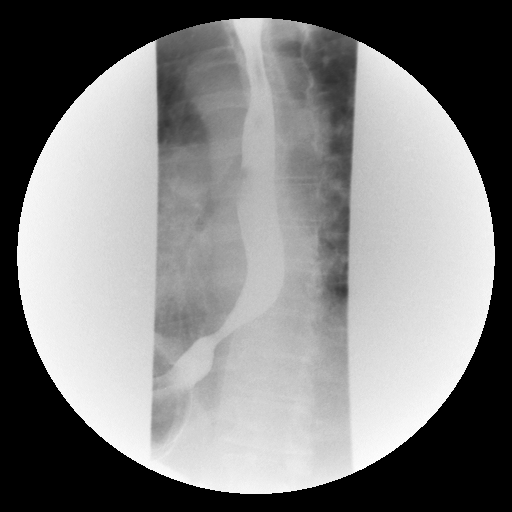

[Series 15: run · 1 of 1 slices shown (9 of 13)]
[im 1/1]
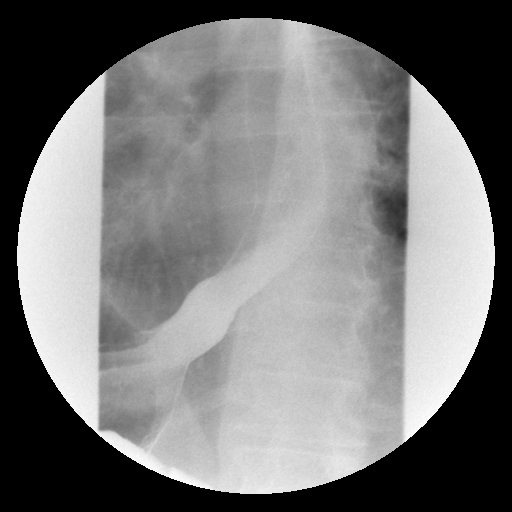

[Series 16: run · 1 of 1 slices shown (10 of 13)]
[im 1/1]
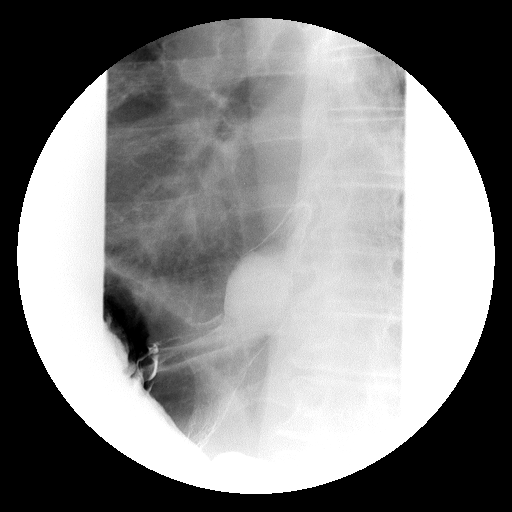

[Series 17: run · 1 of 1 slices shown (11 of 13)]
[im 1/1]
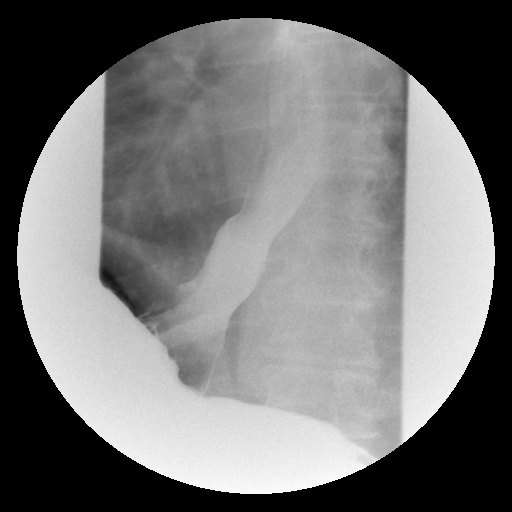

[Series 20: run · 1 of 1 slices shown (12 of 13)]
[im 1/1]
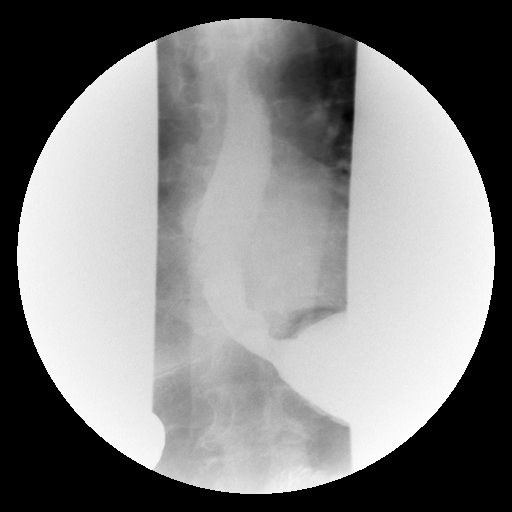

[Series 21: run · 1 of 1 slices shown (13 of 13)]
[im 1/1]
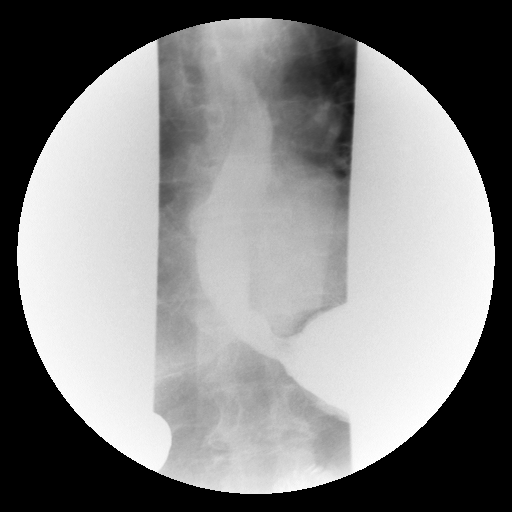

[19 of 24 positions shown; findings below may reference images not displayed]

FINDINGS: A double contrast study was performed.  The mucosa of
the esophagus is unremarkable.  A single contrast study does show a
prominent cricopharyngeus muscle to be present.  Only mild tertiary
contractions are noted.  A tiny hiatal hernia is present.  Moderate
gastroesophageal reflux is noted.  A barium pill was given at the
end of the study which did pass into the stomach without delay.
IMPRESSION: 1.  Small hiatal hernia with moderate gastroesophageal reflux.
Barium pill passes into the stomach without delay.
2.  Prominent cricopharyngeus muscle.

## 2014-05-29 ENCOUNTER — Ambulatory Visit (INDEPENDENT_AMBULATORY_CARE_PROVIDER_SITE_OTHER): Payer: Medicare Other

## 2014-05-29 DIAGNOSIS — I639 Cerebral infarction, unspecified: Secondary | ICD-10-CM

## 2014-05-29 DIAGNOSIS — Z8679 Personal history of other diseases of the circulatory system: Secondary | ICD-10-CM | POA: Diagnosis not present

## 2014-05-29 DIAGNOSIS — G459 Transient cerebral ischemic attack, unspecified: Secondary | ICD-10-CM

## 2014-05-29 DIAGNOSIS — Z5181 Encounter for therapeutic drug level monitoring: Secondary | ICD-10-CM | POA: Diagnosis not present

## 2014-05-29 LAB — POCT INR: INR: 2.3

## 2014-06-12 ENCOUNTER — Ambulatory Visit (INDEPENDENT_AMBULATORY_CARE_PROVIDER_SITE_OTHER): Payer: Medicare Other | Admitting: Internal Medicine

## 2014-06-12 ENCOUNTER — Encounter: Payer: Self-pay | Admitting: Internal Medicine

## 2014-06-12 ENCOUNTER — Other Ambulatory Visit (INDEPENDENT_AMBULATORY_CARE_PROVIDER_SITE_OTHER): Payer: Medicare Other

## 2014-06-12 VITALS — BP 140/80 | HR 65 | Temp 97.6°F | Wt 180.0 lb

## 2014-06-12 DIAGNOSIS — M25569 Pain in unspecified knee: Secondary | ICD-10-CM

## 2014-06-12 DIAGNOSIS — F0390 Unspecified dementia without behavioral disturbance: Secondary | ICD-10-CM

## 2014-06-12 DIAGNOSIS — M545 Low back pain: Secondary | ICD-10-CM | POA: Diagnosis not present

## 2014-06-12 DIAGNOSIS — R413 Other amnesia: Secondary | ICD-10-CM

## 2014-06-12 DIAGNOSIS — G458 Other transient cerebral ischemic attacks and related syndromes: Secondary | ICD-10-CM

## 2014-06-12 DIAGNOSIS — Z23 Encounter for immunization: Secondary | ICD-10-CM

## 2014-06-12 DIAGNOSIS — I639 Cerebral infarction, unspecified: Secondary | ICD-10-CM | POA: Diagnosis not present

## 2014-06-12 DIAGNOSIS — G8929 Other chronic pain: Secondary | ICD-10-CM

## 2014-06-12 LAB — BASIC METABOLIC PANEL
BUN: 18 mg/dL (ref 6–23)
CO2: 26 mEq/L (ref 19–32)
Calcium: 9.2 mg/dL (ref 8.4–10.5)
Chloride: 106 mEq/L (ref 96–112)
Creatinine, Ser: 1.1 mg/dL (ref 0.4–1.5)
GFR: 66.4 mL/min (ref >60.00)
Glucose, Bld: 106 mg/dL — ABNORMAL HIGH (ref 70–99)
POTASSIUM: 3.9 meq/L (ref 3.5–5.1)
SODIUM: 140 meq/L (ref 135–145)

## 2014-06-12 NOTE — Assessment & Plan Note (Signed)
Doing ok Using a cane

## 2014-06-12 NOTE — Assessment & Plan Note (Signed)
No relapse 

## 2014-06-12 NOTE — Progress Notes (Signed)
The patient presents for a follow-up of  chronic hypertension, memory loss, chronic dyslipidemia,controlled with medicines.     Review of Systems    Past Medical History  Diagnosis Date  . GERD 03/15/2007    erosive esophagitis  . HYPERTENSION 03/15/2007  . HYPERLIPIDEMIA 10/25/2007  . OSTEOARTHRITIS 03/15/2007    right knee  . CVA (cerebral infarction) 10/27/2010  . CEREBROVASCULAR ACCIDENT, HX OF 05/17/2007  . COLONIC POLYPS, HX OF 10/25/2007  . 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 BENIGN PROSTATIC HYPERTROPHY 08/12/2007  . PERIPHERAL VASCULAR DISEASE 10/16/2007    carotids < 70%  . TRANSIENT ISCHEMIC ATTACK 03/19/2008  . Nephrolithiasis   . Vitamin D deficiency   . Acquired deviated nasal septum   . Chronic maxillary sinusitis   . Chronic ethmoidal sinusitis   . Blood disorder   . Chronic anticoagulation   . Lupus anticoagulant disorder     initially diagnosed in 2004 after CVA  . Hiatal hernia     Family History  Problem Relation Age of Onset  . Alzheimer's disease Father   . Heart disease Father   . Hypertension Other   . Allergies    . Hearing loss    . Hypertension    . Rheum arthritis    . Stroke Paternal Grandmother   . Heart attack Maternal Grandfather   . Leukemia Mother     History   Social History  . Marital Status: Married    Spouse Name: N/A    Number of Children: N/A  . Years of Education: N/A   Occupational History  . RETIRED [employer]   Social History Main Topics  . Smoking status: Former Smoker -- 0.5 packs/day for 20 years    Types: Cigarettes    Quit date: 08/28/1973  . Smokeless tobacco: Former Systems developer    Types: [place] date: 08/28/1960  . Alcohol Use: Yes     Comment: social  . Drug Use: Yes    Special: Methaqualone  . Sexually Active: Yes   Other Topics Concern  . Not on file   Social History Narrative  . No narrative on file    Allergies  Allergen Reactions  . Cefuroxime Axetil Other (See Comments)     bad taste  . Hydrochlorothiazide Other (See Comments)    unknown  . Iohexol Hives       . Lovastatin Other (See Comments)    cramps  . Niacin Other (See Comments)    burning  . Pseudoeph-Doxylamine-Dm-Apap Other (See Comments)    tremor  . Ramipril Cough  . Tramadol Hcl Other (See Comments)    hallucinating  . Viagra (Sildenafil Citrate)     Feverish feeling     Constitutional: Denies headache, fatigue and fever or abrupt weight changes.  HEENT:  Positive sore throat. Denies eye redness, eye pain, pressure behind the eyes, facial pain, nasal congestionear pain, ringing in the ears, wax buildup, runny nose or bloody nose. Respiratory: Positive cough. Denies difficulty breathing or shortness of breath.  Cardiovascular: Denies chest pain, chest tightness, palpitations or swelling in the hands or feet.   No other specific complaints in a complete review of systems (except as listed in HPI above).  Objective:    General: Appears his stated age, well developed, well nourished in NAD. HEENT: Head: normal shape and size; Eyes: sclera white, no icterus, conjunctiva pink, PERRLA and EOMs intact; Ears: Tm's gray and intact, normal light reflex; Nose: mucosa pink and moist, septum  midline; Throat/Mouth: + PND. Teeth present, mucosa pink and moist, no exudate noted, no lesions or ulcerations noted.  Neck: Neck supple, trachea midline. No massses, lumps or thyromegaly present.  Cardiovascular: Normal rate and rhythm. S1,S2 noted.  No murmur, rubs or gallops noted. No JVD or BLE edema. No carotid bruits noted. Pulmonary/Chest: Normal effort and positive vesicular breath sounds. No respiratory distress. No wheezes, rales or ronchi noted.   Lab Results  Component Value Date   WBC 4.9 08/02/2013   HGB 14.2 08/02/2013   HCT 41.2 08/02/2013   PLT 128* 08/02/2013   GLUCOSE 97 02/06/2014   CHOL 168 04/18/2013   TRIG 197.0* 04/18/2013   HDL 34.00* 04/18/2013   LDLDIRECT 137.9 05/11/2010   LDLCALC  95 04/18/2013   ALT 18 04/18/2013   AST 20 04/18/2013   NA 139 02/06/2014   K 4.1 02/06/2014   CL 106 02/06/2014   CREATININE 1.0 02/06/2014   BUN 20 02/06/2014   CO2 27 02/06/2014   TSH 3.06 02/06/2014   PSA 1.61 10/18/2012   INR 2.3 05/29/2014   HGBA1C 5.8 02/06/2014      Assessment & Plan:

## 2014-06-12 NOTE — Assessment & Plan Note (Signed)
Continue with current prescription therapy as reflected on the Med list.  

## 2014-06-12 NOTE — Progress Notes (Signed)
Pre visit review using our clinic review tool, if applicable. No additional management support is needed unless otherwise documented below in the visit note. 

## 2014-06-16 DIAGNOSIS — N200 Calculus of kidney: Secondary | ICD-10-CM | POA: Diagnosis not present

## 2014-06-16 DIAGNOSIS — N401 Enlarged prostate with lower urinary tract symptoms: Secondary | ICD-10-CM | POA: Diagnosis not present

## 2014-06-16 DIAGNOSIS — R3912 Poor urinary stream: Secondary | ICD-10-CM | POA: Diagnosis not present

## 2014-07-10 ENCOUNTER — Ambulatory Visit (INDEPENDENT_AMBULATORY_CARE_PROVIDER_SITE_OTHER): Payer: Medicare Other | Admitting: *Deleted

## 2014-07-10 DIAGNOSIS — I639 Cerebral infarction, unspecified: Secondary | ICD-10-CM | POA: Diagnosis not present

## 2014-07-10 DIAGNOSIS — Z8679 Personal history of other diseases of the circulatory system: Secondary | ICD-10-CM | POA: Diagnosis not present

## 2014-07-10 DIAGNOSIS — G459 Transient cerebral ischemic attack, unspecified: Secondary | ICD-10-CM | POA: Diagnosis not present

## 2014-07-10 DIAGNOSIS — Z5181 Encounter for therapeutic drug level monitoring: Secondary | ICD-10-CM | POA: Diagnosis not present

## 2014-07-10 LAB — POCT INR: INR: 2.2

## 2014-07-27 ENCOUNTER — Other Ambulatory Visit: Payer: Self-pay | Admitting: Geriatric Medicine

## 2014-07-27 MED ORDER — OMEPRAZOLE 40 MG PO CPDR: DELAYED_RELEASE_CAPSULE | ORAL | Status: DC

## 2014-08-14 ENCOUNTER — Ambulatory Visit (INDEPENDENT_AMBULATORY_CARE_PROVIDER_SITE_OTHER): Payer: Medicare Other

## 2014-08-14 ENCOUNTER — Other Ambulatory Visit: Payer: Self-pay

## 2014-08-14 DIAGNOSIS — G459 Transient cerebral ischemic attack, unspecified: Secondary | ICD-10-CM | POA: Diagnosis not present

## 2014-08-14 DIAGNOSIS — Z8679 Personal history of other diseases of the circulatory system: Secondary | ICD-10-CM

## 2014-08-14 DIAGNOSIS — I639 Cerebral infarction, unspecified: Secondary | ICD-10-CM | POA: Diagnosis not present

## 2014-08-14 DIAGNOSIS — Z5181 Encounter for therapeutic drug level monitoring: Secondary | ICD-10-CM | POA: Diagnosis not present

## 2014-08-14 LAB — POCT INR: INR: 2.3

## 2014-08-14 MED ORDER — VERAPAMIL HCL ER 180 MG PO TBCR: 180.0000 mg | EXTENDED_RELEASE_TABLET | Freq: Every day | ORAL | Status: DC

## 2014-08-19 ENCOUNTER — Other Ambulatory Visit: Payer: Self-pay | Admitting: *Deleted

## 2014-08-19 MED ORDER — OMEPRAZOLE 40 MG PO CPDR: DELAYED_RELEASE_CAPSULE | ORAL | Status: DC

## 2014-09-14 ENCOUNTER — Other Ambulatory Visit: Payer: Self-pay | Admitting: Cardiovascular Disease

## 2014-09-25 ENCOUNTER — Encounter: Payer: Self-pay | Admitting: Neurology

## 2014-09-25 ENCOUNTER — Ambulatory Visit (INDEPENDENT_AMBULATORY_CARE_PROVIDER_SITE_OTHER): Payer: Medicare Other | Admitting: *Deleted

## 2014-09-25 ENCOUNTER — Ambulatory Visit (INDEPENDENT_AMBULATORY_CARE_PROVIDER_SITE_OTHER): Payer: Medicare Other | Admitting: Neurology

## 2014-09-25 VITALS — BP 162/78 | HR 52 | Temp 97.4°F | Ht 65.0 in | Wt 179.0 lb

## 2014-09-25 DIAGNOSIS — Z5181 Encounter for therapeutic drug level monitoring: Secondary | ICD-10-CM | POA: Diagnosis not present

## 2014-09-25 DIAGNOSIS — Z8673 Personal history of transient ischemic attack (TIA), and cerebral infarction without residual deficits: Secondary | ICD-10-CM

## 2014-09-25 DIAGNOSIS — G459 Transient cerebral ischemic attack, unspecified: Secondary | ICD-10-CM | POA: Diagnosis not present

## 2014-09-25 DIAGNOSIS — G309 Alzheimer's disease, unspecified: Secondary | ICD-10-CM | POA: Diagnosis not present

## 2014-09-25 DIAGNOSIS — I639 Cerebral infarction, unspecified: Secondary | ICD-10-CM | POA: Diagnosis not present

## 2014-09-25 DIAGNOSIS — F0280 Dementia in other diseases classified elsewhere without behavioral disturbance: Secondary | ICD-10-CM

## 2014-09-25 DIAGNOSIS — Z8679 Personal history of other diseases of the circulatory system: Secondary | ICD-10-CM

## 2014-09-25 LAB — POCT INR: INR: 2.8

## 2014-09-25 MED ORDER — MEMANTINE HCL-DONEPEZIL HCL ER 28-10 MG PO CP24: 28.0000 mg | ORAL_CAPSULE | Freq: Every day | ORAL | Status: DC

## 2014-09-25 NOTE — Patient Instructions (Signed)
We will switch your Aricept 10 mg and Namenda 28 mg to once daily Namzaric 28/10 mg for convenience.

## 2014-09-25 NOTE — Progress Notes (Signed)
Subjective:    Patient ID: Douglas [name] is a 79 y.o. male.  HPI     Interim history:  Douglas [name] is a very pleasant 79 year old right-handed gentleman with an underlying medical history of hypertension, kidney stones, lupus anticoagulant, hyperlipidemia, left-sided stroke in 2004, vitamin D deficiency, reflux disease, prostate hypertrophy, colonic polyps, right knee pain and status post rotator cuff surgery bilaterally, who presents for followup consultation of his dementia. He is accompanied by his wife Douglas [name] again today. I last saw him on 03/20/14, at which time he reported doing well overall, but his knees were bothering him. He had a steroid injection into the R knee in 4/15, which helped some. He saw Dr. Mardelle Matte. Sadly, his middle daughter (adoptive) died of lung cancer at age 29 in March 2015. On a positive note, his youngest daughter, Douglas [name], was getting married. His oldest Daughter, Douglas [name], lives in [place]. His MMSE was 20/30, CDT 1/4, AFT 11/min in July 2015. We continued Namenda long-acting at 28 mg once daily and Aricept at 10 mg once daily.  Today, he reports having good days and bad days and Douglas [name] says that he is doing fairly.   I saw him on 10/24/2013, at which time I asked him to continue with Namenda long-acting 28 mg daily and Aricept 10 mg daily.  I saw him on 04/18/2013, at which time I increased his Namenda XR to 21 mg daily. However they were not able to fill it because of shortage of the medication and he has been on Namenda XR 28 mg since November 2014, per PCP.   I first met him on 01/10/2013, at which time his MMSE was 21, clock drawing was 2, animal fluency was 12. I felt that he most likely has Alzheimer's dementia. He had some decline in his MMSE score. I asked him to start Namenda XR 7 mg once daily for one month then 14 mg once daily thereafter. I also asked him to see Dr. Valentina Shaggy for neurocognitive testing. He had that consultation in July 2014: In essence,  findings were conclusive with mild Alzheimer's dementia. He did well with the addition of Namenda. He was tolerating it well and his wife was particularly pleased to see that he was thinking more clearly and was less irritable and less angry all the time. It had made a significant difference in his cognitive functioning, they both agreed and he reported no SEs.    He previously followed with Dr. Morene Antu and was last seen by him on 07/19/2012, which time Dr. Erling Cruz felt the patient had mild dementia and discussed dementia related studies. While he did not change his medication he asked the patient to take his generic Aricept during the day rather than later at night.   He has an approximately 6 year history of memory loss, particularly forgetfulness. MRI brain with and without contrast in February 2009 showed an old infarct in the left occipital lobe, mild atrophy, and moderate small vessel disease. CT head without contrast in July 2009 and March 2012 showed no other new abnormalities. Vitamin B12 level and TSH were normal in March 2012. The patient is exercising regularly. He is independent in his daily living and drives a car. She has some depression but is not on an antidepressant. In August 2012 his MMSE was 27, clock drawing was 4, animal fluency was 14. In February 2013 his MMSE was 23, clock drawing was 4, animal fluency was 12. In July 2013 his MMSE was 23, clock  drawing was 3, animal fluency was 8.   His Past Medical History Is Significant For: Past Medical History  Diagnosis Date  . GERD 03/15/2007    erosive esophagitis  . HYPERTENSION 03/15/2007  . HYPERLIPIDEMIA 10/25/2007  . OSTEOARTHRITIS 03/15/2007    right knee  . CVA (cerebral infarction) 10/27/2010  . CEREBROVASCULAR ACCIDENT, HX OF 05/17/2007  . COLONIC POLYPS, HX OF 10/25/2007  . 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 BENIGN PROSTATIC HYPERTROPHY 08/12/2007  . PERIPHERAL VASCULAR DISEASE 10/16/2007    carotids < 70%  . TRANSIENT  ISCHEMIC ATTACK 03/19/2008  . Nephrolithiasis   . Vitamin D deficiency   . Acquired deviated nasal septum   . Chronic maxillary sinusitis   . Chronic ethmoidal sinusitis   . Blood disorder   . Chronic anticoagulation   . Lupus anticoagulant disorder     initially diagnosed in 2004 after CVA  . Hiatal hernia   . Stroke   . Allergy   . Clotting disorder     clotting disorder    His Past Surgical History Is Significant For: Past Surgical History  Procedure Laterality Date  . Lung biopsy  1980    s/p  . Nasal sinus surgery      s/p   . Lithotripsy      s/p  . Rotator cuff repair      x 2  . Nasal septal deviation repair    . Colonoscopy      His Family History Is Significant For: Family History  Problem Relation Age of Onset  . Alzheimer's disease Father   . Heart disease Father   . Hypertension Other   . Allergies    . Hearing loss    . Hypertension    . Rheum arthritis    . Stroke Paternal Grandmother   . Heart attack Maternal Grandfather   . Leukemia Mother   . Colon cancer Neg Hx   . Esophageal cancer Neg Hx   . Stomach cancer Neg Hx   . Rectal cancer Neg Hx     His Social History Is Significant For: History   Social History  . Marital Status: Married    Spouse Name: N/A    Number of Children: N/A  . Years of Education: N/A   Occupational History  . RETIRED DISTRICT [employer]   Social History Main Topics  . Smoking status: Former Smoker -- 0.50 packs/day for 20 years    Types: Cigarettes    Quit date: 08/28/1973  . Smokeless tobacco: Former Systems developer    Types: [place] date: 08/28/1960  . Alcohol Use: 0.0 oz/week    0 Not specified per week     Comment: social  . Drug Use: Yes    Special: Methaqualone  . Sexual Activity: Yes   Other Topics Concern  . None   Social History Narrative    His Allergies Are:  Allergies  Allergen Reactions  . Cefuroxime Axetil Other (See Comments)    bad taste  . Hydrochlorothiazide Other (See  Comments)    unknown  . Iohexol Hives       . Lovastatin Other (See Comments)    cramps  . Niacin Other (See Comments)    burning  . Pseudoeph-Doxylamine-Dm-Apap Other (See Comments)    tremor  . Ramipril Cough  . Tramadol Hcl Other (See Comments)    hallucinating  . Viagra [Sildenafil Citrate]     Feverish feeling  :   His Current Medications Are:  Outpatient Encounter Prescriptions as of 09/25/2014  Medication Sig  . Aclidinium Bromide (TUDORZA PRESSAIR) 400 MCG/ACT AEPB Inhale 1 Act into the lungs 2 (two) times daily.  Marland Kitchen albuterol (PROAIR HFA) 108 (90 BASE) MCG/ACT inhaler Inhale 2 puffs into the lungs 4 (four) times daily as needed. For wheezing  . aspirin EC 81 MG tablet Take 81 mg by mouth daily.  . cholecalciferol (VITAMIN D) 1000 UNITS tablet Take 1,000 Units by mouth daily.    . cloNIDine (CATAPRES) 0.1 MG tablet Take 0.1 mg by mouth 2 (two) times daily.  . diclofenac sodium (VOLTAREN) 1 % GEL Apply 2 g topically 4 (four) times daily as needed. To both knees prn  . donepezil (ARICEPT) 10 MG tablet TAKE 1 TABLET (10 MG TOTAL) BY MOUTH EVERY MORNING.  . fluticasone (FLONASE) 50 MCG/ACT nasal spray Place 2 sprays into the nose daily.  . folic acid (FOLVITE) 749 MCG tablet Take 800 mcg by mouth daily.  Marland Kitchen loratadine (CLARITIN) 10 MG tablet Take 1 tablet (10 mg total) by mouth daily.  Marland Kitchen losartan (COZAAR) 100 MG tablet Take 1 tablet (100 mg total) by mouth daily.  . Memantine HCl ER (NAMENDA XR) 28 MG CP24 Take 28 mg by mouth daily.  Marland Kitchen omeprazole (PRILOSEC) 40 MG capsule TAKE ONE CAPSULE BY MOUTH TWICE DAILY  . tamsulosin (FLOMAX) 0.4 MG CAPS capsule TAKE 1 CAPSULE (0.4 MG TOTAL) BY MOUTH DAILY.  . verapamil (CALAN-SR) 180 MG CR tablet Take 1 tablet (180 mg total) by mouth daily.  Marland Kitchen warfarin (COUMADIN) 2.5 MG tablet TAKE 2 TABLETS BY MOUTH DAILY, EXCEPT 1 TABLET ON MONDAYS, WEDNESDAYS, AND FRIDAYS, OR AS DIRECTED BY COUMADIN CLINIC  . ipratropium (ATROVENT) 0.06 % nasal spray  Place 2 sprays into the nose 4 (four) times daily.  . Memantine HCl-Donepezil HCl (NAMZARIC) 28-10 MG CP24 Take 28 mg by mouth daily.  . [DISCONTINUED] verapamil (CALAN-SR) 180 MG CR tablet TAKE 1 TABLET (180 MG TOTAL) BY MOUTH DAILY. (Patient not taking: Reported on 09/25/2014)  :  Review of Systems:  Out of a complete 14 point review of systems, all are reviewed and negative with the exception of these symptoms as listed below:   Review of Systems  Neurological:       Dreams a lot(4out of 7 nights weekly),trouble dressing himself sometimes.    Objective:  Neurologic Exam  Physical Exam Physical Examination:   Filed Vitals:   09/25/14 1131  BP: 162/78  Pulse: 52  Temp: 97.4 F (36.3 C)    General Examination: The patient is a very pleasant 79 y.o. male in no acute distress. He is calm and cooperative with the exam. He denies Auditory Hallucinations and Visual Hallucinations.   HEENT: Normocephalic, atraumatic, pupils are equal, round and reactive to light and accommodation. Funduscopic exam is normal with sharp disc margins noted. Extraocular tracking shows mild saccadic breakdown without nystagmus noted. Hearing is intact. Face is symmetric with no facial masking and normal facial sensation. There is no lip, neck or jaw tremor. Neck is not rigid with intact passive ROM. There are no carotid bruits on auscultation. Oropharynx exam reveals mild mouth dryness. No significant airway crowding is noted. Mallampati is class II. Tongue protrudes centrally and palate elevates symmetrically.    Chest: is clear to auscultation without wheezing, rhonchi or crackles noted.  Heart: sounds are regular and normal without murmurs, rubs or gallops noted.   Abdomen: is soft, non-tender and non-distended with normal bowel sounds appreciated on auscultation.  Extremities: There is no pitting edema in the distal lower extremities bilaterally.   Skin: is warm and dry with no trophic changes  noted.  Musculoskeletal: exam reveals no obvious joint deformities, tenderness or joint swelling or erythema, except b/l knee pain, more on R.  Neurologically:  Mental status: The patient is awake and alert, paying good  attention. He is able to partially provide the history. His daughter provides details. He is oriented to: person, place, situation and day of week.  His memory, attention, language and knowledge are impaired. MMSE: 21/30, CDT: 1/4, and AFT: 8/min.  There is no aphasia, agnosia, apraxia or anomia. There is a mild degree of bradyphrenia. Speech is not hypophonic with no dysarthria noted. Mood is congruent and affect is normal.   His MMSE score in May 2014 was 21/30, CDT 2/4, AFT (Animal Fluency Test) score was 12.   On 03/20/2014: MMSE: 20/30, CDT: 1/4, AFT: 11 On 09/25/2014: MMSE: 16/30, CDT: 1/4, AFT: 8, GDS: 3/15.  Cranial nerves are as described above under HEENT exam. In addition, shoulder shrug is normal with equal shoulder height noted.  Motor exam: Normal bulk, and strength for age is noted. Tone is not rigid with absence of cogwheeling. There is overall no bradykinesia. There is no drift or rebound. There is no tremor. Romberg is negative. Reflexes are 1+ in the upper extremities and 1+ in the lower extremities. Toes are downgoing bilaterally. Fine motor skills: Finger taps, hand movements, and rapid alternating patting are mildly impaired bilaterally. Foot taps and foot agility are mildly impaired bilaterally.   Cerebellar testing shows no dysmetria or intention tremor on finger to nose testing. Heel to shin is difficult for him, especially on the R. There is no truncal or gait ataxia.   Sensory exam is intact to light touch, pinprick, vibration, temperature sense in the upper and lower extremities.   Gait, station and balance: He stands up from the seated position with no difficulty and needs no assistance. No veering to one side is noted. No leaning to one side. Posture  is mildly stooped in the low back. Stance is wide-based. He turns in 3 steps. Tandem walk is not possible. Balance is mildly impaired. He has a single point cane and has a mild limp on the R.   Assessment and Plan:   In summary, Douglas [name] is a very pleasant 79 year old male with an underlying medical history of hypertension, kidney stones, lupus anticoagulant (on coumadin), hyperlipidemia, left-sided stroke in 2004, vitamin D deficiency, reflux disease, prostate hypertrophy, colonic polyps, right knee pain and status post rotator cuff surgery bilaterally, who presents for followup consultation of his Alzheimer's dementia. His memory numbers have taken a decline over the past years and he continues on dual dementia therapy. He has been tolerating the medications well. He had neuropsychological testing in July 2014, and the conclusion was consistent with mild Alzheimer's dementia. I would like to repeat this sometime this year if possible. At this juncture I think we should continue with Namenda XR 28 mg and continue with Aricept 10 mg daily but I would like to see if he can take the once daily combination pill called Namzaric for convenience. I have asked him to stay active mentally and physically. I have asked him to drink more water. I would like to see him back in 4 to 6 months from now, sooner if the need arises. They were both in agreement. I spent 30 in total face-to-face time with the  patient, more 50% of which was spent in counseling and coordination of care, reviewing test results, reviewing medication and reviewing the diagnosis of AD, its prognosis and treatment options.

## 2014-10-01 ENCOUNTER — Telehealth: Payer: Self-pay | Admitting: *Deleted

## 2014-10-01 DIAGNOSIS — G309 Alzheimer's disease, unspecified: Principal | ICD-10-CM

## 2014-10-01 DIAGNOSIS — F0280 Dementia in other diseases classified elsewhere without behavioral disturbance: Secondary | ICD-10-CM

## 2014-10-01 MED ORDER — MEMANTINE HCL 10 MG PO TABS: 10.0000 mg | ORAL_TABLET | Freq: Two times a day (BID) | ORAL | Status: DC

## 2014-10-01 MED ORDER — DONEPEZIL HCL 10 MG PO TABS: ORAL_TABLET | ORAL | Status: DC

## 2014-10-01 NOTE — Telephone Encounter (Signed)
I called the pharmacy and spoke with Webb Silversmith.  She verified they did get Rx's, but had to order the meds so they could fill 90 day supply.  They will contact patient when Rx's are ready.  I called the patient, got no answer.  Left message.

## 2014-10-01 NOTE — Telephone Encounter (Signed)
Is not financially possible for the patient to be on Namzaric. I will change his medications back to generic Namenda twice daily and generic Aricept once daily.

## 2014-10-01 NOTE — Telephone Encounter (Signed)
Can you look into this, Douglas [name]? I was told by the rep it should not cost the patient that much more to switch to Namaric.

## 2014-10-01 NOTE — Telephone Encounter (Signed)
Patient wife calling stating that the patient Rx Namzaric is way to expensive for one month. It would cost them 95 monthly. The wife would like the patient to go back on Nemenda and Aricept because those were more affordable. Please advise.

## 2014-10-01 NOTE — Telephone Encounter (Signed)
I called the pharmacy.  Spoke with Mickel Baas.  She said if the patient gets generic Namenda BID and generic Donepezil, the co-pays would be lower, (approx $15 each) because both meds are generic.  Since Namzaric is brand name only, the co-pay was higher.  She is unsure if this will decrease once the deductible is met or not.  I called ins.  Was on hold for over 30 minutes.  Finally got an answer and was told they could not assist me, I would need a different dept.  Was then transferred and the line disconnected.  I called back again Spoke with Melissa.  She said Namzaric is a non preferred item on the formulary.  Says it is a tier 4, and patient has met deductible.  Co-pay will be $95 until he reaches the donut hole, at that point it will increase to roughly $116 per month (45% of the cost of this drug).  Namenda XR is also non-formulary, but is a Tier 3 drug.   Hopefully this helps.  Thanks.

## 2014-10-03 ENCOUNTER — Other Ambulatory Visit: Payer: Self-pay | Admitting: Neurology

## 2014-10-07 NOTE — Progress Notes (Signed)
Patient ID: Douglas [name], male   DOB: October 09, 1935, 79 y.o.   MRN: [id number] Douglas [name] is seen back today for a follow up visit.  He has a history of a hypercoagulable state with a positive lupus anticoagulate and is on chronic coumadin. He has had prior CVA, PVD, HTN, HLD and dementia.   He has hypertension and hyperlipidemia. Carotid Dopplers in  5/14  demonstrated 0-39% bilateral ICA stenosis. He was seen in March 2011 with complaints of chest pain. He was enrolled in the Promise trial and randomized to Myoview testing. The nuclear study demonstrated an ejection fraction of 59%, inferior and apical thinning but no ischemia. His last echocardiogram was in February 2009 and demonstrated normal LV function with an ejection fraction of 55-60%.  Renal duplex from 7/13  reviewed No AAA and no RAS  Dementia is progressive Not driving wife much younger than him  Likes to talk about [place] sports    Current outpatient prescriptions:  .  Aclidinium Bromide (TUDORZA PRESSAIR) 400 MCG/ACT AEPB, Inhale 1 Act into the lungs 2 (two) times daily., Disp: 1 each, Rfl: 11 .  albuterol (PROAIR HFA) 108 (90 BASE) MCG/ACT inhaler, Inhale 2 puffs into the lungs 4 (four) times daily as needed. For wheezing, Disp: 8.5 g, Rfl: 3 .  aspirin EC 81 MG tablet, Take 81 mg by mouth daily., Disp: , Rfl:  .  cholecalciferol (VITAMIN D) 1000 UNITS tablet, Take 1,000 Units by mouth daily.  , Disp: , Rfl:  .  cloNIDine (CATAPRES) 0.1 MG tablet, Take 0.1 mg by mouth 2 (two) times daily., Disp: , Rfl:  .  diclofenac sodium (VOLTAREN) 1 % GEL, Apply 2 g topically 4 (four) times daily as needed. To both knees prn, Disp: 100 g, Rfl: 3 .  donepezil (ARICEPT) 10 MG tablet, TAKE 1 TABLET (10 MG TOTAL) BY MOUTH EVERY MORNING., Disp: 90 tablet, Rfl: 3 .  fluticasone (FLONASE) 50 MCG/ACT nasal spray, Place 2 sprays into the nose daily., Disp: 16 g, Rfl: 11 .  folic acid (FOLVITE) 324 MCG tablet, Take 800 mcg by mouth daily., Disp: ,  Rfl:  .  ipratropium (ATROVENT) 0.06 % nasal spray, Place 2 sprays into the nose 4 (four) times daily., Disp: 15 mL, Rfl: 5 .  loratadine (CLARITIN) 10 MG tablet, Take 1 tablet (10 mg total) by mouth daily., Disp: 100 tablet, Rfl: 3 .  losartan (COZAAR) 100 MG tablet, Take 1 tablet (100 mg total) by mouth daily., Disp: 90 tablet, Rfl: 3 .  memantine (NAMENDA) 10 MG tablet, Take 1 tablet (10 mg total) by mouth 2 (two) times daily., Disp: 180 tablet, Rfl: 3 .  Memantine HCl ER (NAMENDA XR) 28 MG CP24, Take 28 mg by mouth daily., Disp: 90 capsule, Rfl: 3 .  Memantine HCl-Donepezil HCl (NAMZARIC) 28-10 MG CP24, Take 28 mg by mouth daily., Disp: 30 capsule, Rfl: 5 .  omeprazole (PRILOSEC) 40 MG capsule, TAKE ONE CAPSULE BY MOUTH TWICE DAILY, Disp: 180 capsule, Rfl: 3 .  tamsulosin (FLOMAX) 0.4 MG CAPS capsule, TAKE 1 CAPSULE (0.4 MG TOTAL) BY MOUTH DAILY., Disp: 90 capsule, Rfl: 2 .  verapamil (CALAN-SR) 180 MG CR tablet, Take 1 tablet (180 mg total) by mouth daily., Disp: 30 tablet, Rfl: 11 .  warfarin (COUMADIN) 2.5 MG tablet, TAKE 2 TABLETS BY MOUTH DAILY, EXCEPT 1 TABLET ON MONDAYS, WEDNESDAYS, AND FRIDAYS, OR AS DIRECTED BY COUMADIN CLINIC, Disp: 145 tablet, Rfl: 0  Current facility-administered medications:  .  methylPREDNISolone acetate (  DEPO-MEDROL) injection 80 mg, 80 mg, Intra-articular, Once, Aleksei Plotnikov V, MD  ROS: Denies fever, malais, weight loss, blurry vision, decreased visual acuity, cough, sputum, SOB, hemoptysis, pleuritic pain, palpitaitons, heartburn, abdominal pain, melena, lower extremity edema, claudication, or rash.  All other systems reviewed and negative  General: Affect appropriate Healthy:  appears stated age 28: normal Neck supple with no adenopathy JVP normal no bruits no thyromegaly Lungs clear with no wheezing and good diaphragmatic motion Heart:  S1/S2 no murmur, no rub, gallop or click PMI normal Abdomen: benighn, BS positve, no tenderness, no AAA no  bruit.  No HSM or HJR Distal pulses intact with no bruits No edema Neuro non-focal Skin warm and dry No muscular weakness     Allergies  Cefuroxime axetil; Hydrochlorothiazide; Iohexol; Lovastatin; Niacin; Pseudoeph-doxylamine-dm-apap; Ramipril; Tramadol hcl; and Viagra  Electrocardiogram:  SR rate 68 normal  1/15  10/09/14  SR rate 73  Normal with artifact  Assessment and Plan

## 2014-10-09 ENCOUNTER — Encounter: Payer: Self-pay | Admitting: Cardiovascular Disease

## 2014-10-09 ENCOUNTER — Ambulatory Visit (INDEPENDENT_AMBULATORY_CARE_PROVIDER_SITE_OTHER): Payer: Medicare Other | Admitting: Cardiovascular Disease

## 2014-10-09 VITALS — BP 130/80 | HR 73 | Resp 11 | Ht 67.0 in | Wt 180.1 lb

## 2014-10-09 DIAGNOSIS — R0989 Other specified symptoms and signs involving the circulatory and respiratory systems: Secondary | ICD-10-CM

## 2014-10-09 DIAGNOSIS — F0391 Unspecified dementia with behavioral disturbance: Secondary | ICD-10-CM | POA: Diagnosis not present

## 2014-10-09 DIAGNOSIS — I6309 Cerebral infarction due to thrombosis of other precerebral artery: Secondary | ICD-10-CM | POA: Diagnosis not present

## 2014-10-09 DIAGNOSIS — I10 Essential (primary) hypertension: Secondary | ICD-10-CM | POA: Diagnosis not present

## 2014-10-09 NOTE — Patient Instructions (Signed)
Your physician wants you to follow-up in:   [name] will receive a reminder letter in the mail two months in advance. If you don't receive a letter, please call our office to schedule the follow-up appointment. Your physician recommends that you continue on your current medications as directed. Please refer to the Current Medication list given to you today. Your physician has requested that you have a carotid duplex. This test is an ultrasound of the carotid arteries in your neck. It looks at blood flow through these arteries that supply the brain with blood. Allow one hour for this exam. There are no restrictions or special instructions. DUE IN MAY

## 2014-10-09 NOTE — Assessment & Plan Note (Signed)
Remote related to hypercoagulability Continue anticoagulation

## 2014-10-09 NOTE — Assessment & Plan Note (Signed)
Stable Wife takes good care of him  F/u neuro

## 2014-10-09 NOTE — Assessment & Plan Note (Signed)
Well controlled.  Continue current medications and low sodium Dash type diet.    

## 2014-10-18 ENCOUNTER — Other Ambulatory Visit: Payer: Self-pay | Admitting: Cardiovascular Disease

## 2014-10-24 ENCOUNTER — Other Ambulatory Visit: Payer: Self-pay | Admitting: Cardiovascular Disease

## 2014-10-26 NOTE — Telephone Encounter (Signed)
Should the patient be taking this bid or tid? Please advise. Thanks, MI

## 2014-11-01 ENCOUNTER — Other Ambulatory Visit: Payer: Self-pay | Admitting: Cardiovascular Disease

## 2014-11-06 ENCOUNTER — Ambulatory Visit: Payer: Medicare Other | Admitting: Internal Medicine

## 2014-11-06 ENCOUNTER — Ambulatory Visit (INDEPENDENT_AMBULATORY_CARE_PROVIDER_SITE_OTHER): Payer: Medicare Other

## 2014-11-06 DIAGNOSIS — G459 Transient cerebral ischemic attack, unspecified: Secondary | ICD-10-CM

## 2014-11-06 DIAGNOSIS — I639 Cerebral infarction, unspecified: Secondary | ICD-10-CM

## 2014-11-06 DIAGNOSIS — Z5181 Encounter for therapeutic drug level monitoring: Secondary | ICD-10-CM

## 2014-11-06 DIAGNOSIS — Z8679 Personal history of other diseases of the circulatory system: Secondary | ICD-10-CM

## 2014-11-06 LAB — POCT INR: INR: 2.3

## 2014-11-13 ENCOUNTER — Other Ambulatory Visit (INDEPENDENT_AMBULATORY_CARE_PROVIDER_SITE_OTHER): Payer: Medicare Other

## 2014-11-13 ENCOUNTER — Ambulatory Visit: Payer: Medicare Other | Admitting: Internal Medicine

## 2014-11-13 ENCOUNTER — Encounter: Payer: Self-pay | Admitting: Internal Medicine

## 2014-11-13 VITALS — BP 128/74 | HR 63 | Temp 98.3°F | Resp 16 | Ht 67.0 in | Wt 178.0 lb

## 2014-11-13 DIAGNOSIS — M25561 Pain in right knee: Secondary | ICD-10-CM

## 2014-11-13 DIAGNOSIS — R202 Paresthesia of skin: Secondary | ICD-10-CM | POA: Diagnosis not present

## 2014-11-13 DIAGNOSIS — R413 Other amnesia: Secondary | ICD-10-CM

## 2014-11-13 DIAGNOSIS — G8929 Other chronic pain: Secondary | ICD-10-CM

## 2014-11-13 DIAGNOSIS — R739 Hyperglycemia, unspecified: Secondary | ICD-10-CM | POA: Diagnosis not present

## 2014-11-13 DIAGNOSIS — I10 Essential (primary) hypertension: Secondary | ICD-10-CM

## 2014-11-13 DIAGNOSIS — E785 Hyperlipidemia, unspecified: Secondary | ICD-10-CM

## 2014-11-13 DIAGNOSIS — I739 Peripheral vascular disease, unspecified: Secondary | ICD-10-CM

## 2014-11-13 DIAGNOSIS — R269 Unspecified abnormalities of gait and mobility: Secondary | ICD-10-CM

## 2014-11-13 DIAGNOSIS — M1711 Unilateral primary osteoarthritis, right knee: Secondary | ICD-10-CM

## 2014-11-13 DIAGNOSIS — Z23 Encounter for immunization: Secondary | ICD-10-CM

## 2014-11-13 LAB — HEMOGLOBIN A1C: Hgb A1c MFr Bld: 5.8 % (ref 4.6–6.5)

## 2014-11-13 MED ORDER — ACLIDINIUM BROMIDE 400 MCG/ACT IN AEPB: 1.0000 | INHALATION_SPRAY | Freq: Two times a day (BID) | RESPIRATORY_TRACT | Status: DC

## 2014-11-13 MED ORDER — METHYLPREDNISOLONE ACETATE 40 MG/ML IJ SUSP: 80.0000 mg | Freq: Once | INTRAMUSCULAR | Status: AC

## 2014-11-13 MED ORDER — FLUTICASONE PROPIONATE 50 MCG/ACT NA SUSP
2.0000 | Freq: Every day | NASAL | Status: DC
Start: 2014-11-13 — End: 2015-12-29

## 2014-11-13 MED ORDER — METHYLPREDNISOLONE ACETATE 80 MG/ML IJ SUSP
80.0000 mg | Freq: Once | INTRAMUSCULAR | Status: AC
  Administered 2014-11-13: 80 mg via INTRA_ARTICULAR

## 2014-11-13 MED ORDER — IPRATROPIUM BROMIDE 0.06 % NA SOLN: 2.0000 | Freq: Four times a day (QID) | NASAL | Status: DC

## 2014-11-13 NOTE — Assessment & Plan Note (Signed)
Chronic Statin, Niacin intolerant

## 2014-11-13 NOTE — Assessment & Plan Note (Signed)
Will inject - risks w/coumadin discussed

## 2014-11-13 NOTE — Assessment & Plan Note (Signed)
Labs

## 2014-11-13 NOTE — Patient Instructions (Addendum)
Try Aspercreme    Postprocedure instructions :    A Band-Aid should be left on for 12 hours. Injection therapy is not a cure itself. It is used in conjunction with other modalities. You can use nonsteroidal anti-inflammatories like ibuprofen , hot and cold compresses. Rest is recommended in the next 24 hours. You need to report immediately  if fever, chills or any signs of infection develop.

## 2014-11-13 NOTE — Assessment & Plan Note (Signed)
Home PT offered Balance exercises

## 2014-11-13 NOTE — Progress Notes (Signed)
Subjective:    Patient ID: Douglas [name], male    DOB: 24-Feb-1936, 79 y.o.   MRN: [id number]  HPI  The patient presents for a follow-up of  chronic hypertension, chronic dyslipidemia, knee OA - worse, memory loss  Wt Readings from Last 3 Encounters:  11/13/14 178 lb (80.74 kg)  10/09/14 180 lb 1.9 oz (81.702 kg)  09/25/14 179 lb (81.194 kg)   BP Readings from Last 3 Encounters:  11/13/14 128/74  10/09/14 130/80  09/25/14 162/78      Review of Systems  Constitutional: Positive for fever. Negative for appetite change, fatigue and unexpected weight change.  HENT: Negative for congestion, nosebleeds, sneezing, sore throat and trouble swallowing.   Eyes: Negative for itching and visual disturbance.  Respiratory: Negative for cough.   Cardiovascular: Negative for chest pain, palpitations and leg swelling.  Gastrointestinal: Negative for nausea, diarrhea, blood in stool, abdominal distention and rectal pain.  Genitourinary: Negative for urgency, frequency, hematuria and flank pain.  Musculoskeletal: Positive for joint swelling, arthralgias and gait problem. Negative for back pain and neck pain.  Skin: Negative for rash and wound.  Allergic/Immunologic: Negative for food allergies.  Neurological: Negative for dizziness, tremors, syncope, speech difficulty, weakness and headaches.  Psychiatric/Behavioral: Positive for confusion. Negative for suicidal ideas, sleep disturbance, dysphoric mood and agitation. The patient is not nervous/anxious.        Objective:   Physical Exam  Constitutional: He is oriented to person, place, and time. He appears well-developed. No distress.  NAD  HENT:  Mouth/Throat: Oropharynx is clear and moist.  Eyes: Conjunctivae are normal. Pupils are equal, round, and reactive to light.  Neck: Normal range of motion. No JVD present. No thyromegaly present.  Cardiovascular: Normal rate, regular rhythm, normal heart sounds and intact distal pulses.  Exam  reveals no gallop and no friction rub.   No murmur heard. Pulmonary/Chest: Effort normal and breath sounds normal. No respiratory distress. He has no wheezes. He has no rales. He exhibits no tenderness.  Abdominal: Soft. Bowel sounds are normal. He exhibits no distension and no mass. There is no tenderness. There is no rebound and no guarding.  Musculoskeletal: Normal range of motion. He exhibits tenderness. He exhibits no edema.  Lymphadenopathy:    He has no cervical adenopathy.  Neurological: He is alert and oriented to person, place, and time. He has normal reflexes. No cranial nerve deficit. He exhibits normal muscle tone. He displays a negative Romberg sign. Coordination abnormal. Gait normal.  Skin: Skin is warm and dry. No rash noted.  Psychiatric: He has a normal mood and affect. His behavior is normal. Thought content normal.  Short term memory loss is apparent  using a cane  R knee is tender Ataxic  Lab Results  Component Value Date   WBC 4.9 08/02/2013   HGB 14.2 08/02/2013   HCT 41.2 08/02/2013   PLT 128* 08/02/2013   GLUCOSE 106* 06/12/2014   CHOL 168 04/18/2013   TRIG 197.0* 04/18/2013   HDL 34.00* 04/18/2013   LDLDIRECT 137.9 05/11/2010   LDLCALC 95 04/18/2013   ALT 18 04/18/2013   AST 20 04/18/2013   NA 140 06/12/2014   K 3.9 06/12/2014   CL 106 06/12/2014   CREATININE 1.1 06/12/2014   BUN 18 06/12/2014   CO2 26 06/12/2014   TSH 3.06 02/06/2014   PSA 1.61 10/18/2012   INR 2.3 11/06/2014   HGBA1C 5.8 02/06/2014     Procedure Note :     Procedure :  Joint Injection, R  knee   Indication:  Joint osteoarthritis with refractory  chronic pain.   Risks including unsuccessful procedure , bleeding on Coumadin, infection, bruising, skin atrophy, "steroid flare-up" and others were explained to the patient in detail as well as the benefits. Informed consent was obtained and signed.   Tthe patient was placed in a comfortable position. Lateral approach was used.  Skin was prepped with Betadine and alcohol  and anesthetized a cooling spray. Then, a 5 cc syringe with a 1.5 inch long 25-gauge needle was used for a joint injection.. The needle was advanced  Into the knee joint cavity. I aspirated a small amount of intra-articular fluid to confirm correct placement of the needle and injected the joint with 5 mL of 2% lidocaine and 40 mg of Depo-Medrol .  Band-Aid was applied.   Tolerated well. Complications: None. Good pain relief following the procedure.   Postprocedure instructions :    A Band-Aid should be left on for 12 hours. Injection therapy is not a cure itself. It is used in conjunction with other modalities. You can use nonsteroidal anti-inflammatories like ibuprofen , hot and cold compresses. Rest is recommended in the next 24 hours. You need to report immediately  if fever, chills or any signs of infection develop.      Assessment & Plan:

## 2014-11-13 NOTE — Assessment & Plan Note (Signed)
On Namenda, Aricept

## 2014-11-13 NOTE — Progress Notes (Signed)
Pre visit review using our clinic review tool, if applicable. No additional management support is needed unless otherwise documented below in the visit note. 

## 2014-11-16 LAB — BASIC METABOLIC PANEL
BUN: 23 mg/dL (ref 6–23)
CALCIUM: 9.1 mg/dL (ref 8.4–10.5)
CHLORIDE: 111 meq/L (ref 96–112)
CO2: 23 mEq/L (ref 19–32)
Creatinine, Ser: 1.37 mg/dL (ref 0.40–1.50)
GFR: 53.65 mL/min — ABNORMAL LOW (ref >60.00)
GLUCOSE: 106 mg/dL — AB (ref 70–99)
Potassium: 4.5 mEq/L (ref 3.5–5.1)
SODIUM: 145 meq/L (ref 135–145)

## 2014-11-16 LAB — TSH: TSH: 2.11 u[IU]/mL (ref 0.35–4.50)

## 2014-11-16 LAB — VITAMIN B12: Vitamin B-12: 302 pg/mL (ref 211–911)

## 2014-11-23 ENCOUNTER — Telehealth: Payer: Self-pay | Admitting: Neurology

## 2014-11-23 DIAGNOSIS — N390 Urinary tract infection, site not specified: Secondary | ICD-10-CM

## 2014-11-23 NOTE — Telephone Encounter (Signed)
Talked with wife, [name]. She stated that patient has had recent on set of confusion starting last week. With further talking with patient, he may have symptoms of a UTI. She stated she will make appt with PCP and will call back if further assistance is needed.

## 2014-11-23 NOTE — Telephone Encounter (Signed)
Ok Thx 

## 2014-11-23 NOTE — Telephone Encounter (Signed)
Patient wife is reqeusting to have UA order placed to come in and take test rather than needing an O/V. Patient was recently seen on 11/13/2014. Please notify her if this is possible.

## 2014-11-23 NOTE — Telephone Encounter (Signed)
Pt's wife is calling stating in the last week the pt has gotten worse with his memory, he has fallen.  Having problems going to the bathroom, not sure if he should sit or stand, not knowing to pull down pants.  Wife is very concerned and wants him to be seen sooner than July.  I offered the next ava in May, but wants sooner.  Please call and advise.

## 2014-11-24 ENCOUNTER — Other Ambulatory Visit: Payer: Self-pay | Admitting: *Deleted

## 2014-11-24 ENCOUNTER — Other Ambulatory Visit (INDEPENDENT_AMBULATORY_CARE_PROVIDER_SITE_OTHER): Payer: Medicare Other

## 2014-11-24 DIAGNOSIS — N390 Urinary tract infection, site not specified: Secondary | ICD-10-CM

## 2014-11-24 LAB — URINALYSIS, ROUTINE W REFLEX MICROSCOPIC
BILIRUBIN URINE: NEGATIVE
Hgb urine dipstick: NEGATIVE
Ketones, ur: NEGATIVE
Leukocytes, UA: NEGATIVE
Nitrite: NEGATIVE
PH: 6 (ref 5.0–8.0)
Specific Gravity, Urine: 1.025 (ref 1.000–1.030)
UROBILINOGEN UA: 0.2 (ref 0.0–1.0)
Urine Glucose: NEGATIVE

## 2014-11-24 NOTE — Addendum Note (Signed)
Addended by: Earnstine Regal on: 11/24/2014 09:04 AM   Modules accepted: Orders

## 2014-11-24 NOTE — Telephone Encounter (Signed)
Notified pt wife md ok UA order has been place in epic...Johny Chess

## 2014-11-26 ENCOUNTER — Ambulatory Visit (INDEPENDENT_AMBULATORY_CARE_PROVIDER_SITE_OTHER): Payer: Medicare Other | Admitting: Internal Medicine

## 2014-11-26 ENCOUNTER — Encounter: Payer: Self-pay | Admitting: Internal Medicine

## 2014-11-26 VITALS — BP 118/80 | HR 72 | Wt 173.0 lb

## 2014-11-26 DIAGNOSIS — M5440 Lumbago with sciatica, unspecified side: Secondary | ICD-10-CM | POA: Diagnosis not present

## 2014-11-26 DIAGNOSIS — G8929 Other chronic pain: Secondary | ICD-10-CM

## 2014-11-26 DIAGNOSIS — M25569 Pain in unspecified knee: Secondary | ICD-10-CM

## 2014-11-26 DIAGNOSIS — M170 Bilateral primary osteoarthritis of knee: Secondary | ICD-10-CM | POA: Diagnosis not present

## 2014-11-26 DIAGNOSIS — Z0000 Encounter for general adult medical examination without abnormal findings: Secondary | ICD-10-CM | POA: Diagnosis not present

## 2014-11-26 NOTE — Assessment & Plan Note (Signed)
Chronic ?spinal stenosis OA

## 2014-11-26 NOTE — Progress Notes (Signed)
Pre visit review using our clinic review tool, if applicable. No additional management support is needed unless otherwise documented below in the visit note. 

## 2014-11-26 NOTE — Progress Notes (Signed)
Subjective:    Patient ID: Douglas [name], male    DOB: May 23, 1936, 79 y.o.   MRN: [id number]  HPI  C/o B knee pain, unsteady gait, falls. LBP is worse R knee pain is better after a shot  Wt Readings from Last 3 Encounters:  11/26/14 173 lb (78.472 kg)  11/13/14 178 lb (80.74 kg)  10/09/14 180 lb 1.9 oz (81.702 kg)   BP Readings from Last 3 Encounters:  11/26/14 118/80  11/13/14 128/74  10/09/14 130/80      Review of Systems  Constitutional: Positive for fever. Negative for appetite change, fatigue and unexpected weight change.  HENT: Negative for congestion, nosebleeds, sneezing, sore throat and trouble swallowing.   Eyes: Negative for itching and visual disturbance.  Respiratory: Negative for cough.   Cardiovascular: Negative for chest pain, palpitations and leg swelling.  Gastrointestinal: Negative for nausea, diarrhea, blood in stool, abdominal distention and rectal pain.  Genitourinary: Negative for urgency, frequency, hematuria and flank pain.  Musculoskeletal: Positive for joint swelling, arthralgias and gait problem. Negative for back pain and neck pain.  Skin: Negative for rash and wound.  Allergic/Immunologic: Negative for food allergies.  Neurological: Negative for dizziness, tremors, syncope, speech difficulty, weakness and headaches.  Psychiatric/Behavioral: Positive for confusion. Negative for suicidal ideas, sleep disturbance, dysphoric mood and agitation. The patient is not nervous/anxious.        Objective:   Physical Exam  Constitutional: He is oriented to person, place, and time. He appears well-developed. No distress.  NAD  HENT:  Mouth/Throat: Oropharynx is clear and moist.  Eyes: Conjunctivae are normal. Pupils are equal, round, and reactive to light.  Neck: Normal range of motion. No JVD present. No thyromegaly present.  Cardiovascular: Normal rate, regular rhythm, normal heart sounds and intact distal pulses.  Exam reveals no gallop and no  friction rub.   No murmur heard. Pulmonary/Chest: Effort normal and breath sounds normal. No respiratory distress. He has no wheezes. He has no rales. He exhibits no tenderness.  Abdominal: Soft. Bowel sounds are normal. He exhibits no distension and no mass. There is no tenderness. There is no rebound and no guarding.  Musculoskeletal: Normal range of motion. He exhibits tenderness. He exhibits no edema.  Lymphadenopathy:    He has no cervical adenopathy.  Neurological: He is alert and oriented to person, place, and time. He has normal reflexes. No cranial nerve deficit. He exhibits normal muscle tone. He displays a negative Romberg sign. Coordination abnormal. Gait normal.  Skin: Skin is warm and dry. No rash noted.  Psychiatric: He has a normal mood and affect. His behavior is normal. Thought content normal.  Short term memory loss is apparent  using a cane LS is tender R knee is less tender Ataxic  Lab Results  Component Value Date   WBC 4.9 08/02/2013   HGB 14.2 08/02/2013   HCT 41.2 08/02/2013   PLT 128* 08/02/2013   GLUCOSE 106* 11/13/2014   CHOL 168 04/18/2013   TRIG 197.0* 04/18/2013   HDL 34.00* 04/18/2013   LDLDIRECT 137.9 05/11/2010   LDLCALC 95 04/18/2013   ALT 18 04/18/2013   AST 20 04/18/2013   NA 145 11/13/2014   K 4.5 11/13/2014   CL 111 11/13/2014   CREATININE 1.37 11/13/2014   BUN 23 11/13/2014   CO2 23 11/13/2014   TSH 2.11 11/13/2014   PSA 1.61 10/18/2012   INR 2.3 11/06/2014   HGBA1C 5.8 11/13/2014        Assessment &  Plan:

## 2014-11-26 NOTE — Assessment & Plan Note (Signed)
Ortho ref

## 2014-11-26 NOTE — Assessment & Plan Note (Addendum)
Appt w/Dr Mardelle Matte

## 2014-11-29 ENCOUNTER — Other Ambulatory Visit: Payer: Self-pay | Admitting: Internal Medicine

## 2014-11-30 ENCOUNTER — Ambulatory Visit (INDEPENDENT_AMBULATORY_CARE_PROVIDER_SITE_OTHER): Payer: Medicare Other | Admitting: Neurology

## 2014-11-30 ENCOUNTER — Encounter: Payer: Self-pay | Admitting: Neurology

## 2014-11-30 VITALS — BP 118/62 | HR 66 | Resp 16 | Ht 67.0 in | Wt 171.0 lb

## 2014-11-30 DIAGNOSIS — Z9181 History of falling: Secondary | ICD-10-CM

## 2014-11-30 DIAGNOSIS — R4182 Altered mental status, unspecified: Secondary | ICD-10-CM | POA: Diagnosis not present

## 2014-11-30 DIAGNOSIS — F0390 Unspecified dementia without behavioral disturbance: Secondary | ICD-10-CM

## 2014-11-30 DIAGNOSIS — R269 Unspecified abnormalities of gait and mobility: Secondary | ICD-10-CM | POA: Diagnosis not present

## 2014-11-30 NOTE — Progress Notes (Signed)
Subjective:    Patient ID: Douglas [name] is a 79 y.o. male.  HPI     Interim history:   Douglas [name] is a very pleasant 79 year old right-handed gentleman with an underlying medical history of hypertension, kidney stones, lupus anticoagulant, hyperlipidemia, left-sided stroke in 2004, vitamin D deficiency, reflux disease, prostate hypertrophy, colonic polyps, right knee pain and status post rotator cuff surgery bilaterally, who presents for followup consultation of his dementia. He is accompanied by his daughter, Douglas [name], today and presents for a sooner than scheduled appointment d/t more confusion, recent falls x 2 some 2 weeks ago and more gait insecurity. I last saw him on 09/25/2014, at which time his MMSE was 16, clock drawing was 1, AFT was 8. He stated he had good and bad days. I changed his medication to Namzaric once daily.  Today, 11/30/2014: he reports having more issues with simple things, such as going to the bathroom. Additional history is provided by his daughter. She states that he had 2 falls within 1 week about 2 weeks ago. Both times he fell in the bathroom but thankfully he did not hurt himself. One time he tripped over a rug. His wife has removed all drugs. She has installed handrails in their bathroom. One time he got confusedgood daysas to where the bathroom actually was. He saw his primary care physician last week and had blood work and a urinalysis. I reviewed those results. Blood work was fine including BMP, TSH, A1c, and B12 level which was on the lower end of the spectrum at 302. His urinalysis was negative. He has been using a cane. He has a walker available and his wife also got him a leg trick scooter. He is trying to drink more water. He tries to exercise. He no longer walks the dog. He has more family come into help out and make sure he does not fall. His last head CT was in 2013 and his last brain MRI was in 2009. Last time we changed his medication to [facility], but  his wife called and reported that they were not able to afford it. We changed him back to generic Aricept and Namenda XR.    Previously:   I saw him on 03/20/14, at which time he reported doing well overall, but his knees were bothering him. He had a steroid injection into the R knee in 4/15, which helped some. He saw Dr. Mardelle [name]. Sadly, his middle daughter (adoptive) died of lung cancer at age 109 in March 2015. On a positive note, his youngest daughter, Douglas [name], was getting married. His oldest Daughter, Douglas [name], lives in [place]. His MMSE was 20/30, CDT 1/4, AFT 11/min in July 2015. We continued Namenda long-acting at 28 mg once daily and Aricept at 10 mg once daily.  I saw him on 10/24/2013, at which time I asked him to continue with Namenda long-acting 28 mg daily and Aricept 10 mg daily.  I saw him on 04/18/2013, at which time I increased his Namenda XR to 21 mg daily. However they were not able to fill it because of shortage of the medication and he has been on Namenda XR 28 mg since November 2014, per PCP.  I first met him on 01/10/2013, at which time his MMSE was 21, clock drawing was 2, animal fluency was 12. I felt that he most likely has Alzheimer's dementia. He had some decline in his MMSE score. I asked him to start Namenda XR 7 mg once daily for one month  then 14 mg once daily thereafter. I also asked him to see Dr. Valentina [name] for neurocognitive testing. He had that consultation in July 2014: In essence, findings were conclusive with mild Alzheimer's dementia. He did well with the addition of Namenda. He was tolerating it well and his wife was particularly pleased to see that he was thinking more clearly and was less irritable and less angry all the time. It had made a significant difference in his cognitive functioning, they both agreed and he reported no SEs.   He previously followed with Dr. Morene [name] and was last seen by him on 07/19/2012, which time Dr. Erling [name] felt the patient had mild  dementia and discussed dementia related studies. While he did not change his medication he asked the patient to take his generic Aricept during the day rather than later at night.  He has an approximately 6 year history of memory loss, particularly forgetfulness. MRI brain with and without contrast in February 2009 showed an old infarct in the left occipital lobe, mild atrophy, and moderate small vessel disease. CT head without contrast in July 2009 and March 2012 showed no other new abnormalities. Vitamin B12 level and TSH were normal in March 2012. The patient is exercising regularly. He is independent in his daily living and drives a car. She has some depression but is not on an antidepressant. In August 2012 his MMSE was 27, clock drawing was 4, animal fluency was 14. In February 2013 his MMSE was 23, clock drawing was 4, animal fluency was 12. In July 2013 his MMSE was 23, clock drawing was 3, animal fluency was 8.   His Past Medical History Is Significant For: Past Medical History  Diagnosis Date  . GERD 03/15/2007    erosive esophagitis  . HYPERTENSION 03/15/2007  . HYPERLIPIDEMIA 10/25/2007  . OSTEOARTHRITIS 03/15/2007    right knee  . CVA (cerebral infarction) 10/27/2010  . CEREBROVASCULAR ACCIDENT, HX OF 05/17/2007  . COLONIC POLYPS, HX OF 10/25/2007  . 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 BENIGN PROSTATIC HYPERTROPHY 08/12/2007  . PERIPHERAL VASCULAR DISEASE 10/16/2007    carotids < 70%  . TRANSIENT ISCHEMIC ATTACK 03/19/2008  . Nephrolithiasis   . Vitamin D deficiency   . Acquired deviated nasal septum   . Chronic maxillary sinusitis   . Chronic ethmoidal sinusitis   . Blood disorder   . Chronic anticoagulation   . Lupus anticoagulant disorder     initially diagnosed in 2004 after CVA  . Hiatal hernia   . Stroke   . Allergy   . Clotting disorder     clotting disorder    His Past Surgical History Is Significant For: Past Surgical History  Procedure Laterality Date  .  Lung biopsy  1980    s/p  . Nasal sinus surgery      s/p   . Lithotripsy      s/p  . Rotator cuff repair      x 2  . Nasal septal deviation repair    . Colonoscopy      His Family History Is Significant For: Family History  Problem Relation Age of Onset  . Alzheimer's disease Father   . Heart disease Father   . Hypertension Other   . Allergies    . Hearing loss    . Hypertension    . Rheum arthritis    . Stroke Paternal Grandmother   . Heart attack Maternal Grandfather   . Leukemia Mother   . Colon cancer Neg  Hx   . Esophageal cancer Neg Hx   . Stomach cancer Neg Hx   . Rectal cancer Neg Hx     His Social History Is Significant For: History   Social History  . Marital Status: Married    Spouse Name: N/A  . Number of Children: N/A  . Years of Education: N/A   Occupational History  . RETIRED [employer]   Social History Main Topics  . Smoking status: Former Smoker -- 0.50 packs/day for 20 years    Types: Cigarettes    Quit date: 08/28/1973  . Smokeless tobacco: Former Neurosurgeon    Types: Chew    Quit date: 08/28/1960  . Alcohol Use: 0.0 oz/week    0 Standard drinks or equivalent per week     Comment: social  . Drug Use: Yes    Special: Methaqualone  . Sexual Activity: Yes   Other Topics Concern  . None   Social History Narrative    His Allergies Are:  Allergies  Allergen Reactions  . Cefuroxime Axetil Other (See Comments)    bad taste  . Hydrochlorothiazide Other (See Comments)    unknown  . Iohexol Hives       . Lovastatin Other (See Comments)    cramps  . Niacin Other (See Comments)    burning  . Pseudoeph-Doxylamine-Dm-Apap Other (See Comments)    tremor  . Ramipril Cough  . Tramadol Hcl Other (See Comments)    hallucinating  . Viagra [Sildenafil Citrate]     Feverish feeling  :   His Current Medications Are:  Outpatient Encounter Prescriptions as of 11/30/2014  Medication Sig  . albuterol (PROAIR HFA) 108 (90 BASE) MCG/ACT  inhaler Inhale 2 puffs into the lungs 4 (four) times daily as needed. For wheezing  . aspirin EC 81 MG tablet Take 81 mg by mouth daily.  . cholecalciferol (VITAMIN D) 1000 UNITS tablet Take 1,000 Units by mouth daily.    . cloNIDine (CATAPRES) 0.1 MG tablet Take 1 tablet (0.1 mg total) by mouth 2 (two) times daily.  Marland Kitchen donepezil (ARICEPT) 10 MG tablet TAKE 1 TABLET (10 MG TOTAL) BY MOUTH EVERY MORNING.  . fluticasone (FLONASE) 50 MCG/ACT nasal spray Place 2 sprays into both nostrils daily.  . folic acid (FOLVITE) 800 MCG tablet Take 800 mcg by mouth daily.  Marland Kitchen ipratropium (ATROVENT) 0.06 % nasal spray Place 2 sprays into the nose 4 (four) times daily.  Marland Kitchen loratadine (CLARITIN) 10 MG tablet Take 1 tablet (10 mg total) by mouth daily.  Marland Kitchen losartan (COZAAR) 100 MG tablet TAKE 1 TABLET (100 MG TOTAL) BY MOUTH DAILY.  Marland Kitchen Memantine HCl ER (NAMENDA XR) 28 MG CP24 Take 28 mg by mouth daily.  Marland Kitchen omeprazole (PRILOSEC) 40 MG capsule TAKE ONE CAPSULE BY MOUTH TWICE DAILY  . verapamil (CALAN-SR) 180 MG CR tablet TAKE 1 TABLET (180 MG TOTAL) BY MOUTH DAILY.  Marland Kitchen warfarin (COUMADIN) 2.5 MG tablet TAKE 2 TABLETS BY MOUTH DAILY, EXCEPT 1 TABLET ON MONDAYS, WEDNESDAYS,AND FRIDAYS, OR AS DIRECTED BY COUMADIN CLINIC  . [DISCONTINUED] tamsulosin (FLOMAX) 0.4 MG CAPS capsule TAKE 1 CAPSULE (0.4 MG TOTAL) BY MOUTH DAILY.  . [DISCONTINUED] Aclidinium Bromide (TUDORZA PRESSAIR) 400 MCG/ACT AEPB Inhale 1 Act into the lungs 2 (two) times daily.  :  Review of Systems:  Out of a complete 14 point review of systems, all are reviewed and negative with the exception of these symptoms as listed below:   Review of Systems  Neurological:  Speech differences, appetite changes, confusion on where bathroom is and how to go, confusion within the house (where to sleep, where 2nd floor is). Increased agitation increased shuffling gait.     Objective:  Neurologic Exam  Physical Exam Physical Examination:   Filed Vitals:    11/30/14 0934  BP: 118/62  Pulse: 66  Resp: 16    General Examination: The patient is a very pleasant 79 y.o. male in no acute distress. He is calm and cooperative with the exam. He denies Auditory Hallucinations and Visual Hallucinations. He does seem a little bit more quiet today.  HEENT: Normocephalic, atraumatic, pupils are equal, round and reactive to light and accommodation. Funduscopic exam is normal with sharp disc margins noted. Extraocular tracking shows mild saccadic breakdown without nystagmus noted. Hearing is intact. Face is symmetric with no facial masking and normal facial sensation. There is no lip, neck or jaw tremor. Neck is not rigid with intact passive ROM. There are no carotid bruits on auscultation. Oropharynx exam reveals mild mouth dryness. No significant airway crowding is noted. Mallampati is class II. Tongue protrudes centrally and palate elevates symmetrically.    Chest: is clear to auscultation without wheezing, rhonchi or crackles noted.  Heart: sounds are regular and normal without murmurs, rubs or gallops noted.   Abdomen: is soft, non-tender and non-distended with normal bowel sounds appreciated on auscultation.  Extremities: There is no pitting edema in the distal lower extremities bilaterally.   Skin: is warm and dry with no trophic changes noted.  Musculoskeletal: exam reveals no obvious joint deformities, tenderness or joint swelling or erythema, except b/l knee pain, more on R.  Neurologically:  Mental status: The patient is awake and alert, paying good  attention. He is able to partially provide the history. His daughter provides details. He is oriented to: person, place, situation and day of week.  His memory, attention, language and knowledge are impaired. MMSE: 21/30, CDT: 1/4, and AFT: 8/min.  There is no aphasia, agnosia, apraxia or anomia. There is a mild degree of bradyphrenia. Speech is not hypophonic with no dysarthria noted. Mood is congruent  and affect is normal. He does seem to be a little bit more quiet today and has difficulty relating his history.  His MMSE score in May 2014 was 21/30, CDT 2/4, AFT (Animal Fluency Test) score was 12.    On 03/20/2014: MMSE: 20/30, CDT: 1/4, AFT: 11  On 09/25/2014: MMSE: 16/30, CDT: 1/4, AFT: 8, GDS: 3/15.  Cranial nerves are as described above under HEENT exam. In addition, shoulder shrug is normal with equal shoulder height noted.  Motor exam: Normal bulk, and strength for age is noted. Tone is not rigid with absence of cogwheeling. There is overall no bradykinesia. There is no drift or rebound. There is no tremor. Romberg is negative. Reflexes are 1+ in the upper extremities and 1+ in the lower extremities. Toes are downgoing bilaterally. Fine motor skills: Finger taps, hand movements, and rapid alternating patting are mildly impaired bilaterally. Foot taps and foot agility are mildly impaired bilaterally.   Cerebellar testing shows no dysmetria or intention tremor on finger to nose testing. Heel to shin is difficult for him, especially on the R. There is no truncal or gait ataxia.   Sensory exam is intact to light touch, pinprick, vibration, temperature sense in the upper and lower extremities.   Gait, station and balance: He stands up from the seated position with no difficulty and needs no assistance. No veering to one  side is noted. No leaning to one side. Posture is mildly stooped in the low back. Stance is wide-based. He turns in 3 steps. Tandem walk is not possible. Balance is mildly impaired. He has a single point cane and has a mild limp on the R. he does seem to walk a little bit more insecurely today.  Assessment and Plan:   In summary, SHAQUAN MISSEY is a very pleasant 79 year old male with an underlying medical history of hypertension, kidney stones, lupus anticoagulant (on coumadin), hyperlipidemia, left-sided stroke in 2004, vitamin D deficiency, reflux disease, prostate  hypertrophy, colonic polyps, right knee pain and status post rotator cuff surgery bilaterally, who presents for followup consultation of his Alzheimer's dementia, and particularly presents today for a sooner than scheduled appointment for more confusion lately and recent falls. His memory numbers have taken a decline over the past years and he continues on dual dementia therapy. I talked to the patient and his daughter at length today. I think we need to continue with the current medication regimen. Aricept at a higher dose of 23 mg daily seems to have more risk for side effect in my experience some we will leave his dose at 10 mg and he is already maxed out on the long-acting Namenda. He's had recent appropriate blood work and urinalysis through his primary care physician which I reviewed. He has not had any recent brain scan we will do this for comparison. He's had some adjustments to help with his ADLs at home. I asked him to stay well-hydrated and use his cane or walker at all times for safety. He had neuropsychological testing in July 2014, and the conclusion was consistent with mild Alzheimer's dementia. I would like to repeat this sometime this year if possible. At this juncture I think we should continue with Namenda XR 28 mg and continue with Aricept 10 mg daily. I would like to see him back for his scheduled appointment in July. In the interim, we will call his wife with his MRI results. They were both in agreement. I spent 30 in total face-to-face time with the patient, more 50% of which was spent in counseling and coordination of care, reviewing test results, reviewing medication and reviewing the diagnosis of AD, its prognosis and treatment options.

## 2014-11-30 NOTE — Patient Instructions (Addendum)
Drink more water, use your cane or walker for safety and change positions slowly.   Your recent blood work and urine test was fine, but Vitamin B12 was on the low end of the spectrum, start an over the counter B12 supplement, about 1000 micrograms should be okay.   We will keep your meds and do a brain MRI and call [name] with the results.

## 2014-12-11 ENCOUNTER — Telehealth: Payer: Self-pay | Admitting: Internal Medicine

## 2014-12-11 NOTE — Telephone Encounter (Signed)
[name] is calling to follow up on [place] referral for ortho placed 11/26/2014. Please call her with an update/ resolution

## 2014-12-12 ENCOUNTER — Ambulatory Visit
Admission: RE | Admit: 2014-12-12 | Discharge: 2014-12-12 | Disposition: A | Discharge status: Home / Self Care | Payer: Medicare Other | Source: Ambulatory Visit | Attending: Neurology | Admitting: Neurology

## 2014-12-12 DIAGNOSIS — F0390 Unspecified dementia without behavioral disturbance: Secondary | ICD-10-CM | POA: Diagnosis not present

## 2014-12-12 DIAGNOSIS — R4182 Altered mental status, unspecified: Secondary | ICD-10-CM | POA: Diagnosis not present

## 2014-12-12 DIAGNOSIS — Z9181 History of falling: Secondary | ICD-10-CM

## 2014-12-17 ENCOUNTER — Telehealth: Payer: Self-pay

## 2014-12-17 NOTE — Telephone Encounter (Signed)
-----   Message from Star Age, MD sent at 12/17/2014  1:25 PM EDT ----- Please be to wife, [name], about his recent brain MRI results. Thankfully there are no acute findings. His atrophy which is brain volume loss is worse from when he had his last imaging test which was a head CT from February 2013. This is in keeping with his worsening memory loss but nonspecific. Overall, there are chronic changes and they have progressed with time. Nothing acute and no immediate action is required other than keeping a healthy lifestyle, management of blood pressure, cholesterol, blood sugar values, and symptomatic treatment of his dementia. Star Age, MD, PhD [facility])

## 2014-12-17 NOTE — Telephone Encounter (Signed)
Called pt's wife, [name], and left a message asking her to call back clinic for results.

## 2014-12-17 NOTE — Progress Notes (Signed)
Quick Note:  Please be to wife, [name], about his recent brain MRI results. Thankfully there are no acute findings. His atrophy which is brain volume loss is worse from when he had his last imaging test which was a head CT from February 2013. This is in keeping with his worsening memory loss but nonspecific. Overall, there are chronic changes and they have progressed with time. Nothing acute and no immediate action is required other than keeping a healthy lifestyle, management of blood pressure, cholesterol, blood sugar values, and symptomatic treatment of his dementia. Star Age, MD, PhD [facility] ([facility])  ______

## 2014-12-18 ENCOUNTER — Ambulatory Visit (INDEPENDENT_AMBULATORY_CARE_PROVIDER_SITE_OTHER): Payer: Medicare Other

## 2014-12-18 DIAGNOSIS — G459 Transient cerebral ischemic attack, unspecified: Secondary | ICD-10-CM

## 2014-12-18 DIAGNOSIS — I639 Cerebral infarction, unspecified: Secondary | ICD-10-CM

## 2014-12-18 DIAGNOSIS — Z5181 Encounter for therapeutic drug level monitoring: Secondary | ICD-10-CM | POA: Diagnosis not present

## 2014-12-18 DIAGNOSIS — Z8679 Personal history of other diseases of the circulatory system: Secondary | ICD-10-CM | POA: Diagnosis not present

## 2014-12-18 LAB — POCT INR: INR: 2.5

## 2014-12-18 NOTE — Telephone Encounter (Signed)
-----   Message from Star Age, MD sent at 12/17/2014  1:25 PM EDT ----- Please be to wife, [name], about his recent brain MRI results. Thankfully there are no acute findings. His atrophy which is brain volume loss is worse from when he had his last imaging test which was a head CT from February 2013. This is in keeping with his worsening memory loss but nonspecific. Overall, there are chronic changes and they have progressed with time. Nothing acute and no immediate action is required other than keeping a healthy lifestyle, management of blood pressure, cholesterol, blood sugar values, and symptomatic treatment of his dementia. Star Age, MD, PhD [facility])

## 2014-12-18 NOTE — Telephone Encounter (Signed)
Patients wife returned call. Please call and advise. DW

## 2014-12-18 NOTE — Telephone Encounter (Signed)
I spoke to wife, she is aware of results and states that she understands.

## 2014-12-21 DIAGNOSIS — M170 Bilateral primary osteoarthritis of knee: Secondary | ICD-10-CM | POA: Diagnosis not present

## 2014-12-25 ENCOUNTER — Telehealth: Payer: Self-pay | Admitting: Internal Medicine

## 2014-12-25 NOTE — Telephone Encounter (Signed)
Received records from [facility]. Sent to Dr. Alain Marion. 12/25/14/ss

## 2015-01-01 ENCOUNTER — Ambulatory Visit (HOSPITAL_COMMUNITY): Payer: Medicare Other | Attending: Cardiology

## 2015-01-01 ENCOUNTER — Encounter (HOSPITAL_COMMUNITY): Payer: Medicare Other

## 2015-01-01 DIAGNOSIS — I10 Essential (primary) hypertension: Secondary | ICD-10-CM | POA: Insufficient documentation

## 2015-01-01 DIAGNOSIS — I739 Peripheral vascular disease, unspecified: Secondary | ICD-10-CM | POA: Insufficient documentation

## 2015-01-01 DIAGNOSIS — R0989 Other specified symptoms and signs involving the circulatory and respiratory systems: Secondary | ICD-10-CM

## 2015-01-01 DIAGNOSIS — I2510 Atherosclerotic heart disease of native coronary artery without angina pectoris: Secondary | ICD-10-CM | POA: Diagnosis not present

## 2015-01-01 DIAGNOSIS — Z87891 Personal history of nicotine dependence: Secondary | ICD-10-CM | POA: Diagnosis not present

## 2015-01-01 DIAGNOSIS — Z8673 Personal history of transient ischemic attack (TIA), and cerebral infarction without residual deficits: Secondary | ICD-10-CM | POA: Diagnosis not present

## 2015-01-01 DIAGNOSIS — E785 Hyperlipidemia, unspecified: Secondary | ICD-10-CM | POA: Diagnosis not present

## 2015-01-21 DIAGNOSIS — M170 Bilateral primary osteoarthritis of knee: Secondary | ICD-10-CM | POA: Diagnosis not present

## 2015-01-29 ENCOUNTER — Ambulatory Visit (INDEPENDENT_AMBULATORY_CARE_PROVIDER_SITE_OTHER): Payer: Medicare Other | Admitting: *Deleted

## 2015-01-29 DIAGNOSIS — I639 Cerebral infarction, unspecified: Secondary | ICD-10-CM | POA: Diagnosis not present

## 2015-01-29 DIAGNOSIS — I6310 Cerebral infarction due to embolism of unspecified precerebral artery: Secondary | ICD-10-CM

## 2015-01-29 DIAGNOSIS — Z8679 Personal history of other diseases of the circulatory system: Secondary | ICD-10-CM

## 2015-01-29 DIAGNOSIS — Z5181 Encounter for therapeutic drug level monitoring: Secondary | ICD-10-CM

## 2015-01-29 DIAGNOSIS — G459 Transient cerebral ischemic attack, unspecified: Secondary | ICD-10-CM

## 2015-01-29 LAB — PROTIME-INR
INR: 6.3 ratio — AB (ref 0.8–1.0)
PROTHROMBIN TIME: 66 s — AB (ref 9.6–13.1)

## 2015-01-29 LAB — POCT INR: INR: 6.6

## 2015-02-04 ENCOUNTER — Ambulatory Visit (INDEPENDENT_AMBULATORY_CARE_PROVIDER_SITE_OTHER): Payer: Medicare Other | Admitting: *Deleted

## 2015-02-04 DIAGNOSIS — Z5181 Encounter for therapeutic drug level monitoring: Secondary | ICD-10-CM

## 2015-02-04 DIAGNOSIS — I6310 Cerebral infarction due to embolism of unspecified precerebral artery: Secondary | ICD-10-CM

## 2015-02-04 DIAGNOSIS — Z8679 Personal history of other diseases of the circulatory system: Secondary | ICD-10-CM | POA: Diagnosis not present

## 2015-02-04 DIAGNOSIS — I639 Cerebral infarction, unspecified: Secondary | ICD-10-CM

## 2015-02-04 DIAGNOSIS — G459 Transient cerebral ischemic attack, unspecified: Secondary | ICD-10-CM

## 2015-02-04 LAB — POCT INR: INR: 3.3

## 2015-02-05 ENCOUNTER — Encounter: Payer: Self-pay | Admitting: Internal Medicine

## 2015-02-05 ENCOUNTER — Ambulatory Visit (INDEPENDENT_AMBULATORY_CARE_PROVIDER_SITE_OTHER): Payer: Medicare Other | Admitting: Internal Medicine

## 2015-02-05 VITALS — BP 148/70 | HR 64 | Ht 67.0 in | Wt 163.8 lb

## 2015-02-05 DIAGNOSIS — R1030 Lower abdominal pain, unspecified: Secondary | ICD-10-CM

## 2015-02-05 DIAGNOSIS — K5900 Constipation, unspecified: Secondary | ICD-10-CM | POA: Diagnosis not present

## 2015-02-05 NOTE — Patient Instructions (Signed)
Please follow up as needed 

## 2015-02-05 NOTE — Progress Notes (Signed)
HISTORY OF PRESENT ILLNESS:  Douglas [name] is a 79 y.o. male with multiple significant medical problems as listed below. He is on chronic anticoagulation. He presents today with complaints of chronic constipation associated with lower abdominal burning discomfort. He is accompanied by his wife. Patient reports similar problems July 2015. See that dictation. He continues to strain with bowel movements. This results in abdominal burning discomfort. He has not had significant or any rectal bleeding. Seems to have more difficulties with his memory. GI review of systems is otherwise negative. For his bowel he takes MiraLAX and milk of magnesia infrequently. He is also tried fiber bar which helps.  REVIEW OF SYSTEMS:  All non-GI ROS negative on review  Past Medical History  Diagnosis Date  . GERD 03/15/2007    erosive esophagitis  . HYPERTENSION 03/15/2007  . HYPERLIPIDEMIA 10/25/2007  . OSTEOARTHRITIS 03/15/2007    right knee  . CVA (cerebral infarction) 10/27/2010  . CEREBROVASCULAR ACCIDENT, HX OF 05/17/2007  . COLONIC POLYPS, HX OF 10/25/2007  . 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 BENIGN PROSTATIC HYPERTROPHY 08/12/2007  . PERIPHERAL VASCULAR DISEASE 10/16/2007    carotids < 70%  . TRANSIENT ISCHEMIC ATTACK 03/19/2008  . Nephrolithiasis   . Vitamin D deficiency   . Acquired deviated nasal septum   . Chronic maxillary sinusitis   . Chronic ethmoidal sinusitis   . Blood disorder   . Chronic anticoagulation   . Lupus anticoagulant disorder     initially diagnosed in 2004 after CVA  . Hiatal hernia   . Stroke   . Allergy   . Clotting disorder     clotting disorder    Past Surgical History  Procedure Laterality Date  . Lung biopsy  1980    s/p  . Nasal sinus surgery      s/p   . Lithotripsy      s/p  . Rotator cuff repair      x 2  . Nasal septal deviation repair    . Colonoscopy      Social History Douglas [name]  reports that he quit smoking about 41 years  ago. His smoking use included Cigarettes. He has a 10 pack-year smoking history. He quit smokeless tobacco use about 54 years ago. His smokeless tobacco use included Chew. He reports that he drinks alcohol. He reports that he uses illicit drugs (Methaqualone).  family history includes Allergies in an other family member; Alzheimer's disease in his father; Hearing loss in an other family member; Heart attack in his maternal grandfather; Heart disease in his father; Hypertension in his other and another family member; Leukemia in his mother; Rheum arthritis in an other family member; Stroke in his paternal grandmother. There is no history of Colon cancer, Esophageal cancer, Stomach cancer, or Rectal cancer.  Allergies  Allergen Reactions  . Cefuroxime Axetil Other (See Comments)    bad taste  . Hydrochlorothiazide Other (See Comments)    unknown  . Iohexol Hives       . Lovastatin Other (See Comments)    cramps  . Niacin Other (See Comments)    burning  . Pseudoeph-Doxylamine-Dm-Apap Other (See Comments)    tremor  . Ramipril Cough  . Tramadol Hcl Other (See Comments)    hallucinating  . Viagra [Sildenafil Citrate]     Feverish feeling       PHYSICAL EXAMINATION: Vital signs: BP 148/70 mmHg  Pulse 64  Ht 5\' 7"  (1.702 m)  Wt 163 lb 12.8 oz (74.299  kg)  BMI 25.65 kg/m2 General: Well-developed, well-nourished, no acute distress HEENT: Sclerae are anicteric, conjunctiva pink. Oral mucosa intact Lungs: Clear Heart: Regular Abdomen: soft, nontender, nondistended, no obvious ascites, no peritoneal signs, normal bowel sounds. No organomegaly. Extremities: No edema Psychiatric: alert and oriented x3. Cooperative     ASSESSMENT:  #1. Function of constipation #2. Burning abdominal discomfort secondary to constipation #3. Complete colonoscopy January 2014 with diminutive polyp and hemorrhoids. Otherwise normal #4. Multiple medical problems   PLAN:  #1. Discussed strategies to  help his bowel movements. Recommended trying MiraLAX more frequently. Adjust dosage or frequency to achieve desired effect. #2. Resume general medical care with Dr. Alain Marion. GI follow-up as needed  15 minutes was spent with the patient and his wife. Greater than 50% of the time was used for counseling

## 2015-02-11 ENCOUNTER — Ambulatory Visit (INDEPENDENT_AMBULATORY_CARE_PROVIDER_SITE_OTHER): Payer: Medicare Other

## 2015-02-11 DIAGNOSIS — Z5181 Encounter for therapeutic drug level monitoring: Secondary | ICD-10-CM

## 2015-02-11 DIAGNOSIS — G459 Transient cerebral ischemic attack, unspecified: Secondary | ICD-10-CM | POA: Diagnosis not present

## 2015-02-11 DIAGNOSIS — I639 Cerebral infarction, unspecified: Secondary | ICD-10-CM

## 2015-02-11 DIAGNOSIS — Z8679 Personal history of other diseases of the circulatory system: Secondary | ICD-10-CM

## 2015-02-11 DIAGNOSIS — I6310 Cerebral infarction due to embolism of unspecified precerebral artery: Secondary | ICD-10-CM

## 2015-02-11 LAB — POCT INR: INR: 2.8

## 2015-02-22 ENCOUNTER — Other Ambulatory Visit: Payer: Self-pay

## 2015-02-25 ENCOUNTER — Ambulatory Visit (INDEPENDENT_AMBULATORY_CARE_PROVIDER_SITE_OTHER): Payer: Medicare Other | Admitting: *Deleted

## 2015-02-25 DIAGNOSIS — I639 Cerebral infarction, unspecified: Secondary | ICD-10-CM | POA: Diagnosis not present

## 2015-02-25 DIAGNOSIS — Z5181 Encounter for therapeutic drug level monitoring: Secondary | ICD-10-CM | POA: Diagnosis not present

## 2015-02-25 DIAGNOSIS — Z8679 Personal history of other diseases of the circulatory system: Secondary | ICD-10-CM | POA: Diagnosis not present

## 2015-02-25 DIAGNOSIS — G459 Transient cerebral ischemic attack, unspecified: Secondary | ICD-10-CM

## 2015-02-25 DIAGNOSIS — I6310 Cerebral infarction due to embolism of unspecified precerebral artery: Secondary | ICD-10-CM

## 2015-02-25 LAB — POCT INR: INR: 3.7

## 2015-03-09 DIAGNOSIS — M170 Bilateral primary osteoarthritis of knee: Secondary | ICD-10-CM | POA: Diagnosis not present

## 2015-03-10 ENCOUNTER — Emergency Department (HOSPITAL_COMMUNITY): Payer: Medicare Other

## 2015-03-10 ENCOUNTER — Emergency Department (HOSPITAL_COMMUNITY)
Admission: EM | Admit: 2015-03-10 | Discharge: 2015-03-10 | Disposition: A | Discharge status: Home / Self Care | Payer: Medicare Other | Attending: Emergency Medicine | Admitting: Emergency Medicine

## 2015-03-10 ENCOUNTER — Telehealth: Payer: Self-pay | Admitting: *Deleted

## 2015-03-10 ENCOUNTER — Encounter (HOSPITAL_COMMUNITY): Payer: Self-pay | Admitting: *Deleted

## 2015-03-10 DIAGNOSIS — E785 Hyperlipidemia, unspecified: Secondary | ICD-10-CM | POA: Diagnosis present

## 2015-03-10 DIAGNOSIS — R079 Chest pain, unspecified: Secondary | ICD-10-CM | POA: Diagnosis not present

## 2015-03-10 DIAGNOSIS — Z862 Personal history of diseases of the blood and blood-forming organs and certain disorders involving the immune mechanism: Secondary | ICD-10-CM | POA: Diagnosis not present

## 2015-03-10 DIAGNOSIS — Z7901 Long term (current) use of anticoagulants: Secondary | ICD-10-CM | POA: Insufficient documentation

## 2015-03-10 DIAGNOSIS — R5383 Other fatigue: Secondary | ICD-10-CM | POA: Insufficient documentation

## 2015-03-10 DIAGNOSIS — Z8673 Personal history of transient ischemic attack (TIA), and cerebral infarction without residual deficits: Secondary | ICD-10-CM | POA: Diagnosis not present

## 2015-03-10 DIAGNOSIS — I739 Peripheral vascular disease, unspecified: Secondary | ICD-10-CM | POA: Diagnosis present

## 2015-03-10 DIAGNOSIS — Z79899 Other long term (current) drug therapy: Secondary | ICD-10-CM | POA: Diagnosis not present

## 2015-03-10 DIAGNOSIS — Z87891 Personal history of nicotine dependence: Secondary | ICD-10-CM | POA: Insufficient documentation

## 2015-03-10 DIAGNOSIS — Z86010 Personal history of colonic polyps: Secondary | ICD-10-CM | POA: Diagnosis not present

## 2015-03-10 DIAGNOSIS — M549 Dorsalgia, unspecified: Secondary | ICD-10-CM | POA: Insufficient documentation

## 2015-03-10 DIAGNOSIS — Z7951 Long term (current) use of inhaled steroids: Secondary | ICD-10-CM | POA: Insufficient documentation

## 2015-03-10 DIAGNOSIS — E559 Vitamin D deficiency, unspecified: Secondary | ICD-10-CM | POA: Insufficient documentation

## 2015-03-10 DIAGNOSIS — I10 Essential (primary) hypertension: Secondary | ICD-10-CM | POA: Insufficient documentation

## 2015-03-10 DIAGNOSIS — D6859 Other primary thrombophilia: Secondary | ICD-10-CM | POA: Diagnosis present

## 2015-03-10 DIAGNOSIS — F0390 Unspecified dementia without behavioral disturbance: Secondary | ICD-10-CM | POA: Diagnosis present

## 2015-03-10 DIAGNOSIS — I209 Angina pectoris, unspecified: Secondary | ICD-10-CM | POA: Diagnosis present

## 2015-03-10 DIAGNOSIS — K219 Gastro-esophageal reflux disease without esophagitis: Secondary | ICD-10-CM | POA: Diagnosis not present

## 2015-03-10 DIAGNOSIS — Z87442 Personal history of urinary calculi: Secondary | ICD-10-CM | POA: Diagnosis not present

## 2015-03-10 DIAGNOSIS — Z7982 Long term (current) use of aspirin: Secondary | ICD-10-CM | POA: Insufficient documentation

## 2015-03-10 DIAGNOSIS — R0789 Other chest pain: Secondary | ICD-10-CM | POA: Diagnosis not present

## 2015-03-10 DIAGNOSIS — M179 Osteoarthritis of knee, unspecified: Secondary | ICD-10-CM | POA: Diagnosis not present

## 2015-03-10 DIAGNOSIS — Z8709 Personal history of other diseases of the respiratory system: Secondary | ICD-10-CM | POA: Insufficient documentation

## 2015-03-10 DIAGNOSIS — N400 Enlarged prostate without lower urinary tract symptoms: Secondary | ICD-10-CM | POA: Diagnosis not present

## 2015-03-10 LAB — BASIC METABOLIC PANEL
ANION GAP: 6 (ref 5–15)
BUN: 13 mg/dL (ref 6–20)
CHLORIDE: 107 mmol/L (ref 101–111)
CO2: 27 mmol/L (ref 22–32)
Calcium: 9.5 mg/dL (ref 8.9–10.3)
Creatinine, Ser: 1.22 mg/dL (ref 0.61–1.24)
GFR calc non Af Amer: 56 mL/min — ABNORMAL LOW (ref >60)
GLUCOSE: 106 mg/dL — AB (ref 65–99)
Potassium: 4.5 mmol/L (ref 3.5–5.1)
SODIUM: 140 mmol/L (ref 135–145)

## 2015-03-10 LAB — CBC
HCT: 39.4 % (ref 39.0–52.0)
Hemoglobin: 13.2 g/dL (ref 13.0–17.0)
MCH: 31.1 pg (ref 26.0–34.0)
MCHC: 33.5 g/dL (ref 30.0–36.0)
MCV: 92.9 fL (ref 78.0–100.0)
Platelets: 135 10*3/uL — ABNORMAL LOW (ref 150–400)
RBC: 4.24 MIL/uL (ref 4.22–5.81)
RDW: 14.9 % (ref 11.5–15.5)
WBC: 5.2 10*3/uL (ref 4.0–10.5)

## 2015-03-10 LAB — PROTIME-INR
INR: 2.2 — ABNORMAL HIGH (ref 0.00–1.49)
PROTHROMBIN TIME: 24.2 s — AB (ref 11.6–15.2)

## 2015-03-10 LAB — TROPONIN I

## 2015-03-10 LAB — I-STAT TROPONIN, ED
Troponin i, poc: 0 ng/mL (ref 0.00–0.08)
Troponin i, poc: 0.01 ng/mL (ref 0.00–0.08)

## 2015-03-10 MED ORDER — NITROGLYCERIN 0.4 MG SL SUBL: 0.4000 mg | SUBLINGUAL_TABLET | SUBLINGUAL | Status: DC | PRN

## 2015-03-10 MED ORDER — ASPIRIN 81 MG PO CHEW
324.0000 mg | CHEWABLE_TABLET | Freq: Once | ORAL | Status: AC
  Administered 2015-03-10: 324 mg via ORAL
  Filled 2015-03-10: qty 4

## 2015-03-10 NOTE — ED Provider Notes (Signed)
CSN: [id number]     Arrival date & time 03/10/15  1129 History   First MD Initiated Contact with Patient 03/10/15 1145     Chief Complaint  Patient presents with  . Chest Pain     (Consider location/radiation/quality/duration/timing/severity/associated sxs/prior Treatment) Patient is a 79 y.o. male presenting with chest pain.  Chest Pain Pain location:  Substernal area (both arms, wife reports he reported heaviness in his chest) Pain quality: burning   Pain quality comment:  Warmth, heavy Pain radiates to:  Mid back Duration:  9 hours (started last night, improved some then happened this AM with fatigue lasting 1hr) Timing:  Constant Progression:  Waxing and waning Chronicity:  New Context: at rest   Relieved by:  Nothing Worsened by:  Nothing tried Ineffective treatments:  None tried Associated symptoms: back pain   Associated symptoms: no abdominal pain, no cough, no diaphoresis, no fever, no headache (felt "funny"), no nausea, no numbness and no shortness of breath   Risk factors: high cholesterol and hypertension   Risk factors: no coronary artery disease, no diabetes mellitus, no prior DVT/PE, no smoking and no surgery   Risk factors comment:  No fam hx CAD, +hx of CVA   Past Medical History  Diagnosis Date  . GERD 03/15/2007    erosive esophagitis  . HYPERTENSION 03/15/2007  . HYPERLIPIDEMIA 10/25/2007  . OSTEOARTHRITIS 03/15/2007    right knee  . CVA (cerebral infarction) 10/27/2010  . CEREBROVASCULAR ACCIDENT, HX OF 05/17/2007  . COLONIC POLYPS, HX OF 10/25/2007  . 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 BENIGN PROSTATIC HYPERTROPHY 08/12/2007  . PERIPHERAL VASCULAR DISEASE 10/16/2007    carotids < 70%  . TRANSIENT ISCHEMIC ATTACK 03/19/2008  . Nephrolithiasis   . Vitamin D deficiency   . Acquired deviated nasal septum   . Chronic maxillary sinusitis   . Chronic ethmoidal sinusitis   . Blood disorder   . Chronic anticoagulation   . Lupus anticoagulant disorder      initially diagnosed in 2004 after CVA  . Hiatal hernia   . Stroke   . Allergy   . Clotting disorder     clotting disorder   Past Surgical History  Procedure Laterality Date  . Lung biopsy  1980    s/p  . Nasal sinus surgery      s/p   . Lithotripsy      s/p  . Rotator cuff repair      x 2  . Nasal septal deviation repair    . Colonoscopy     Family History  Problem Relation Age of Onset  . Alzheimer's disease Father   . Heart disease Father   . Hypertension Other   . Allergies    . Hearing loss    . Hypertension    . Rheum arthritis    . Stroke Paternal Grandmother   . Heart attack Maternal Grandfather   . Leukemia Mother   . Colon cancer Neg Hx   . Esophageal cancer Neg Hx   . Stomach cancer Neg Hx   . Rectal cancer Neg Hx    History  Substance Use Topics  . Smoking status: Former Smoker -- 0.50 packs/day for 20 years    Types: Cigarettes    Quit date: 08/28/1973  . Smokeless tobacco: Former Systems developer    Types: [place] date: 08/28/1960  . Alcohol Use: 0.0 oz/week    0 Standard drinks or equivalent per week     Comment: social  Review of Systems  Constitutional: Negative for fever and diaphoresis.  HENT: Negative for sore throat.   Eyes: Negative for visual disturbance.  Respiratory: Negative for cough and shortness of breath.   Cardiovascular: Positive for chest pain.  Gastrointestinal: Negative for nausea and abdominal pain.  Genitourinary: Negative for difficulty urinating.  Musculoskeletal: Positive for back pain. Negative for neck stiffness.  Skin: Negative for rash.  Neurological: Negative for syncope, numbness and headaches (felt "funny").      Allergies  Cefuroxime axetil; Hydrochlorothiazide; Iohexol; Lovastatin; Niacin; Pseudoeph-doxylamine-dm-apap; Ramipril; Tramadol hcl; and Viagra  Home Medications   Prior to Admission medications   Medication Sig Start Date End Date Taking? Authorizing Provider  albuterol (PROAIR HFA) 108 (90  BASE) MCG/ACT inhaler Inhale 2 puffs into the lungs 4 (four) times daily as needed. For wheezing 04/24/12  Yes Aleksei Plotnikov V, MD  aspirin EC 81 MG tablet Take 81 mg by mouth daily.   Yes Historical Provider, MD  cholecalciferol (VITAMIN D) 1000 UNITS tablet Take 1,000 Units by mouth daily.     Yes Historical Provider, MD  cloNIDine (CATAPRES) 0.1 MG tablet Take 1 tablet (0.1 mg total) by mouth 2 (two) times daily. 10/27/14  Yes Josue Hector, MD  donepezil (ARICEPT) 10 MG tablet TAKE 1 TABLET (10 MG TOTAL) BY MOUTH EVERY MORNING. 10/01/14  Yes Star Age, MD  fluticasone (FLONASE) 50 MCG/ACT nasal spray Place 2 sprays into both nostrils daily. 11/13/14  Yes Aleksei Plotnikov V, MD  folic acid (FOLVITE) 034 MCG tablet Take 800 mcg by mouth daily.   Yes Historical Provider, MD  ipratropium (ATROVENT) 0.06 % nasal spray Place 2 sprays into the nose 4 (four) times daily. 11/13/14 11/13/15 Yes Aleksei Plotnikov V, MD  loratadine (CLARITIN) 10 MG tablet Take 1 tablet (10 mg total) by mouth daily. 06/17/12  Yes Aleksei Plotnikov V, MD  losartan (COZAAR) 100 MG tablet TAKE 1 TABLET (100 MG TOTAL) BY MOUTH DAILY. 10/20/14  Yes Sueanne Margarita, MD  Memantine HCl ER (NAMENDA XR) 28 MG CP24 Take 28 mg by mouth daily. 03/20/14  Yes Star Age, MD  omeprazole (PRILOSEC) 40 MG capsule TAKE ONE CAPSULE BY MOUTH TWICE DAILY 08/19/14  Yes Aleksei Plotnikov V, MD  tamsulosin (FLOMAX) 0.4 MG CAPS capsule TAKE 1 CAPSULE (0.4 MG TOTAL) BY MOUTH DAILY. 11/30/14  Yes Aleksei Plotnikov V, MD  verapamil (CALAN-SR) 180 MG CR tablet TAKE 1 TABLET (180 MG TOTAL) BY MOUTH DAILY. 10/20/14  Yes Sueanne Margarita, MD  warfarin (COUMADIN) 2.5 MG tablet TAKE 2 TABLETS BY MOUTH DAILY, EXCEPT 1 TABLET ON MONDAYS, WEDNESDAYS,AND FRIDAYS, OR AS DIRECTED BY COUMADIN CLINIC Patient taking differently: 2.5mg /day except take 5mg  on Tuesday, Thursday and Saturday 11/02/14  Yes Josue Hector, MD   BP 173/71 mmHg  Pulse 56  Temp(Src) 98.1 F (36.7 C)  (Oral)  Resp 18  SpO2 100% Physical Exam  Constitutional: He is oriented to person, place, and time. He appears well-developed and well-nourished. No distress.  HENT:  Head: Normocephalic and atraumatic.  Eyes: Conjunctivae and EOM are normal.  Neck: Normal range of motion.  Cardiovascular: Normal rate, regular rhythm, normal heart sounds and intact distal pulses.  Exam reveals no gallop and no friction rub.   No murmur heard. Pulmonary/Chest: Effort normal and breath sounds normal. No respiratory distress. He has no wheezes. He has no rales.  Abdominal: Soft. He exhibits no distension. There is no tenderness. There is no guarding.  Musculoskeletal: He exhibits no edema.  Neurological: He is alert and oriented to person, place, and time.  Skin: Skin is warm and dry. He is not diaphoretic.  Nursing note and vitals reviewed.   ED Course  Procedures (including critical care time) Labs Review Labs Reviewed  BASIC METABOLIC PANEL - Abnormal; Notable for the following:    Glucose, Bld 106 (*)    GFR calc non Af Amer 56 (*)    All other components within normal limits  CBC - Abnormal; Notable for the following:    Platelets 135 (*)    All other components within normal limits  PROTIME-INR - Abnormal; Notable for the following:    Prothrombin Time 24.2 (*)    INR 2.20 (*)    All other components within normal limits  TROPONIN I  TROPONIN I  I-STAT TROPOININ, ED  Randolm Idol, ED    Imaging Review Dg Chest 2 View  03/10/2015   CLINICAL DATA:  Chest pain.  Question confusion.  EXAM: CHEST  2 VIEW  COMPARISON:  10/08/2011  FINDINGS: Subtle densities in the left lower lung are nonspecific but could represent chronic changes or atelectasis. No significant airspace disease. Heart and mediastinum are within normal limits. The trachea is midline. Atherosclerotic calcifications at the aortic arch. No large pleural effusions. Degenerative changes in thoracic spine.  IMPRESSION: Subtle  left basilar densities could represent atelectasis or chronic changes. No significant airspace disease.   Electronically Signed   By: Markus Daft M.D.   On: 03/10/2015 12:47     EKG Interpretation   Date/Time:  Wednesday March 10 2015 11:39:03 EDT Ventricular Rate:  62 PR Interval:  164 QRS Duration: 90 QT Interval:  410 QTC Calculation: 416 R Axis:   7 Text Interpretation:  Undetermined rhythm Minimal voltage criteria for  LVH, may be normal variant Borderline ECG No significant change since last  tracing (with exception of artifact limiting rhythm analysis) Confirmed by  [facility] MD, Afra Tricarico ([id number]) on 03/10/2015 11:49:22 AM      MDM   Final diagnoses:  Chest pain, unspecified chest pain type    79 year old male with a history of hypertension, hyperlipidemia, CVA, PVD, carotid artery disease presents with concern of sensation of chest heaviness and bilateral arm heaviness.  Patient without dyspnea or history she suggest pulmonary embolus. An EKG was evaluated by me (initially with artifact) however subsequent and showed normal sinus rhythm without acute ST changes consistent with ischemia or pericarditis.  A chest XR and radiology read was reviewed showing likely chronic changes, no sign of dissection/pneumothorax.  Patient's INR is 2.2. Troponin and delta troponin were both negative. Discussed possibility of admission to hospital with patient and wife given his HEART score of 5 as well as other risk factors including lupus anticoagulate, however patient and wife prefer discharge and understand associated risks including heart attack or death. Discussed with patient's cardiologist Dr. Johnsie Cancel and patient will be called for outpatient stress test. Discussed in detail reasons to return to the emergency department, particularly given pt risk for CAD.  Patient discharged in stable condition with understanding of reasons to return.     Gareth Morgan, MD 03/10/15 [phone]

## 2015-03-10 NOTE — Discharge Instructions (Signed)

## 2015-03-10 NOTE — ED Notes (Signed)
Pt reports onset last night of back pain and chest heaviness. Denies any cp or sob at triage. ekg done. Airway intact.

## 2015-03-10 NOTE — Telephone Encounter (Signed)
Evora, Brighten Orndoff >','<< Less Detail',event)" href="javascript:;">More Detail >>   Josue Hector, MD   Sent: Wed March 10, 2015 2:03 PM    To: Richmond Campbell, LPN        Message     Needs f/u lexiscan myovue and see me or PA in ED with chest pain r/o      Douglas [name]  MRN: [id number]   Department: [place]  Date of Visit: 03/10/2015         Chief Complaint     Chest Pain       ED Provider Notes     ED Provider Note       ED Treatment Team     ED Treatment Team        ED Notes report     ED Notes       ED Chart Summary (Nursing)     ED Chart Summary Report (Nursing)         ED Diagnosis     None       ED Disposition     None       AVS Reports     No AVS Snapshots are available for this encounter.       ED Prescriptions     None       Vitals        First Filed Value    BP  155/73 mmHg filed time 03/10/2015 1151    Pulse Rate   59 filed time 03/10/2015 1151    Resp  16 filed time 03/10/2015 1151    SpO2  100 % filed time 03/10/2015 1151    O2 Flow Rate (L/min)      Temp  98.1 F (36.7 C) filed time 03/10/2015 1151    Temp Source  Oral filed time 03/10/2015 1151       Vitals        Most Recent Value    BP  163/74 mmHg filed time 03/10/2015 1345    Pulse Rate   52 filed time 03/10/2015 1345    Resp  14 filed time 03/10/2015 1345    SpO2  100 % filed time 03/10/2015 1345    O2 Flow Rate (L/min)      Temp  98.1 F (36.7 C) filed time 03/10/2015 1151    Temp Source  Oral filed time 03/10/2015 1151        ED Vitals Extended     ED Vitals       Past Medical/Social History     PMH/Social       ED Micro, Lab, POCT  [employer] Ordered     Status Ordering Provider    03/10/15 1500 03/10/15 1403  Troponin I STAT  Comments: 3hr delta troponin   In process SCHLOSSMAN, ERIN      03/10/15 1153 03/10/15 1153  I-stat troponin, ED Once Completed   Final result SCHLOSSMAN, [place]    03/10/15 1152 03/10/15 1152  I-stat troponin, ED Once Completed   Final result SCHLOSSMAN, ERIN    03/10/15 1151 03/10/15 1151  Protime-INR - (order if Patient is taking Coumadin / Warfarin) STAT Completed   Final result SCHLOSSMAN, ERIN    03/10/15 1151 03/10/15 1151  Troponin I STAT Completed   Final result SCHLOSSMAN, ERIN    03/10/15 1140 03/10/15 4403  Basic metabolic panel STAT Completed   Final result SCHLOSSMAN, ERIN    03/10/15 1140  03/10/15 1139  CBC STAT Completed   Final result Gareth Morgan        ED Imaging Orders  [facility] Ordered     Status Ordering Provider    03/10/15 1140 03/10/15 1139  DG Chest 2 View 1 time imaging Completed   Final result Gareth Morgan        ED Medication Orders  [facility] Ordered     Status Ordering Provider    03/10/15 1200 03/10/15 1151  aspirin chewable tablet 324 mg Once    Last MAR action: Given SCHLOSSMAN, ERIN    03/10/15 1151 03/10/15 1151  nitroGLYCERIN (NITROSTAT) SL tablet 0.4 mg Every 5 min PRN    Acknowledged Gareth Morgan        ED All Other Orders  Hide    Start Ordered     Status Ordering Provider    03/10/15 1334 03/10/15 1333  Consult to hospitalist Once  Provider: (Not yet assigned)   Acknowledged [facility], ERIN    03/10/15 1151 03/10/15 1151  Cardiac monitoring Until discontinued    Acknowledged SCHLOSSMAN, ERIN    03/10/15 1151 03/10/15 1151  Pulse oximetry, continuous Continuous    Acknowledged SCHLOSSMAN, ERIN    03/10/15 1151 03/10/15 1151  [place] IV, Maintain IV access Once Completed   Completed SCHLOSSMAN, [place]    03/10/15 1151 03/10/15 1151  ED EKG within 10 minutes Once    Acknowledged SCHLOSSMAN, ERIN    03/10/15 1140 03/10/15 1139  Cardiac monitoring Until discontinued    Acknowledged SCHLOSSMAN,  ERIN    03/10/15 1140 03/10/15 1139  Pulse oximetry, continuous Continuous    Acknowledged SCHLOSSMAN, ERIN    03/10/15 1140 03/10/15 1139  [place] IV, Maintain IV access Once Completed   Completed SCHLOSSMAN, [place]    03/10/15 1140 03/10/15 1139  ED EKG within 10 minutes Once Completed   Preliminary result [place]    03/10/15 1139 03/10/15 1139  EKG 12-Lead Once Completed   Final result DEFAULT, PROVIDER    03/10/15 1138 03/10/15 1138  EKG 12-Lead Once Completed   Preliminary result DEFAULT, PROVIDER        ED Results Report     ED Results       ED Patient Care Timeline report     Go to ED Patient Care Timeline

## 2015-03-10 NOTE — ED Notes (Signed)
Pt placed in gown and in bed. Pt monitored by pulse ox, bp cuff, and 12-lead. 

## 2015-03-11 ENCOUNTER — Ambulatory Visit (INDEPENDENT_AMBULATORY_CARE_PROVIDER_SITE_OTHER): Payer: Medicare Other | Admitting: *Deleted

## 2015-03-11 DIAGNOSIS — G459 Transient cerebral ischemic attack, unspecified: Secondary | ICD-10-CM | POA: Diagnosis not present

## 2015-03-11 DIAGNOSIS — I639 Cerebral infarction, unspecified: Secondary | ICD-10-CM

## 2015-03-11 DIAGNOSIS — Z8679 Personal history of other diseases of the circulatory system: Secondary | ICD-10-CM

## 2015-03-11 DIAGNOSIS — Z5181 Encounter for therapeutic drug level monitoring: Secondary | ICD-10-CM

## 2015-03-11 DIAGNOSIS — Z8673 Personal history of transient ischemic attack (TIA), and cerebral infarction without residual deficits: Secondary | ICD-10-CM

## 2015-03-11 LAB — POCT INR: INR: 3

## 2015-03-12 ENCOUNTER — Encounter: Payer: Self-pay | Admitting: Internal Medicine

## 2015-03-12 ENCOUNTER — Ambulatory Visit (INDEPENDENT_AMBULATORY_CARE_PROVIDER_SITE_OTHER): Payer: Medicare Other | Admitting: Internal Medicine

## 2015-03-12 VITALS — BP 124/60 | HR 64 | Wt 170.0 lb

## 2015-03-12 DIAGNOSIS — R269 Unspecified abnormalities of gait and mobility: Secondary | ICD-10-CM

## 2015-03-12 DIAGNOSIS — F0390 Unspecified dementia without behavioral disturbance: Secondary | ICD-10-CM | POA: Diagnosis not present

## 2015-03-12 DIAGNOSIS — R079 Chest pain, unspecified: Secondary | ICD-10-CM | POA: Diagnosis not present

## 2015-03-12 DIAGNOSIS — I10 Essential (primary) hypertension: Secondary | ICD-10-CM | POA: Diagnosis not present

## 2015-03-12 NOTE — Assessment & Plan Note (Signed)
Using a cane; has a scooter

## 2015-03-12 NOTE — Assessment & Plan Note (Signed)
CL stress test 

## 2015-03-12 NOTE — Assessment & Plan Note (Signed)
On Namenda Reduce or d/c Clonidine

## 2015-03-12 NOTE — Assessment & Plan Note (Signed)
Losartan, Verapamil, Clonidine

## 2015-03-12 NOTE — Progress Notes (Signed)
Subjective:  Patient ID: Douglas [name], male    DOB: 1936-04-21  Age: 79 y.o. MRN: [id number]  CC: No chief complaint on file.   HPI Douglas [name] presents for dementia, OA, HTN. Ronalee Belts went to ER on 7/13 for CP: "79 year old male with a history of hypertension, hyperlipidemia, CVA, PVD, carotid artery disease presents with concern of sensation of chest heaviness and bilateral arm heaviness. Patient without dyspnea or history she suggest pulmonary embolus. An EKG was evaluated by me (initially with artifact) however subsequent and showed normal sinus rhythm without acute ST changes consistent with ischemia or pericarditis. A chest XR and radiology read was reviewed showing likely chronic changes, no sign of dissection/pneumothorax. Patient's INR is 2.2. Troponin and delta troponin were both negative. Discussed possibility of admission to hospital with patient and wife given his HEART score of 5 as well as other risk factors including lupus anticoagulate, however patient and wife prefer discharge and understand associated risks including heart attack or death. Discussed with patient's cardiologist Dr. Johnsie Cancel and patient will be called for outpatient stress test. Discussed in detail reasons to return to the emergency department, particularly given pt risk for CAD. Patient discharged in stable condition with understanding of reasons to return. "  Outpatient Prescriptions Prior to Visit  Medication Sig Dispense Refill  . albuterol (PROAIR HFA) 108 (90 BASE) MCG/ACT inhaler Inhale 2 puffs into the lungs 4 (four) times daily as needed. For wheezing 8.5 g 3  . aspirin EC 81 MG tablet Take 81 mg by mouth daily.    . cholecalciferol (VITAMIN D) 1000 UNITS tablet Take 1,000 Units by mouth daily.      . cloNIDine (CATAPRES) 0.1 MG tablet Take 1 tablet (0.1 mg total) by mouth 2 (two) times daily. 60 tablet 11  . donepezil (ARICEPT) 10 MG tablet TAKE 1 TABLET (10 MG TOTAL) BY MOUTH EVERY MORNING. 90  tablet 3  . fluticasone (FLONASE) 50 MCG/ACT nasal spray Place 2 sprays into both nostrils daily. 16 g 11  . folic acid (FOLVITE) 174 MCG tablet Take 800 mcg by mouth daily.    Marland Kitchen ipratropium (ATROVENT) 0.06 % nasal spray Place 2 sprays into the nose 4 (four) times daily. 15 mL 11  . loratadine (CLARITIN) 10 MG tablet Take 1 tablet (10 mg total) by mouth daily. 100 tablet 3  . losartan (COZAAR) 100 MG tablet TAKE 1 TABLET (100 MG TOTAL) BY MOUTH DAILY. 30 tablet 6  . Memantine HCl ER (NAMENDA XR) 28 MG CP24 Take 28 mg by mouth daily. 90 capsule 3  . omeprazole (PRILOSEC) 40 MG capsule TAKE ONE CAPSULE BY MOUTH TWICE DAILY 180 capsule 3  . tamsulosin (FLOMAX) 0.4 MG CAPS capsule TAKE 1 CAPSULE (0.4 MG TOTAL) BY MOUTH DAILY. 90 capsule 1  . verapamil (CALAN-SR) 180 MG CR tablet TAKE 1 TABLET (180 MG TOTAL) BY MOUTH DAILY. 30 tablet 6  . warfarin (COUMADIN) 2.5 MG tablet TAKE 2 TABLETS BY MOUTH DAILY, EXCEPT 1 TABLET ON MONDAYS, WEDNESDAYS,AND FRIDAYS, OR AS DIRECTED BY COUMADIN CLINIC (Patient taking differently: 2.5mg /day except take 5mg  on Tuesday, Thursday and Saturday) 145 tablet 1   Facility-Administered Medications Prior to Visit  Medication Dose Route Frequency Provider Last Rate Last Dose  . methylPREDNISolone acetate (DEPO-MEDROL) injection 80 mg  80 mg Intra-articular Once Aleksei Plotnikov V, MD      . methylPREDNISolone acetate (DEPO-MEDROL) injection 80 mg  80 mg Intra-articular Once Cassandria Anger, MD  ROS Review of Systems  Constitutional: Positive for fatigue. Negative for appetite change and unexpected weight change.  HENT: Negative for congestion, nosebleeds, sneezing, sore throat and trouble swallowing.   Eyes: Negative for itching and visual disturbance.  Respiratory: Negative for cough.   Cardiovascular: Negative for chest pain, palpitations and leg swelling.  Gastrointestinal: Negative for nausea, diarrhea, blood in stool and abdominal distention.    Genitourinary: Negative for frequency and hematuria.  Musculoskeletal: Positive for back pain and gait problem. Negative for joint swelling and neck pain.  Skin: Negative for rash.  Neurological: Negative for dizziness, tremors, speech difficulty and weakness.  Psychiatric/Behavioral: Positive for decreased concentration. Negative for suicidal ideas, sleep disturbance, self-injury, dysphoric mood and agitation. The patient is not nervous/anxious.     Objective:  BP 124/60 mmHg  Pulse 64  Wt 170 lb (77.111 kg)  SpO2 95%  BP Readings from Last 3 Encounters:  03/12/15 124/60  03/10/15 173/71  02/05/15 148/70    Wt Readings from Last 3 Encounters:  03/12/15 170 lb (77.111 kg)  02/05/15 163 lb 12.8 oz (74.299 kg)  11/30/14 171 lb (77.565 kg)    Physical Exam  Constitutional: He is oriented to person, place, and time. He appears well-developed. No distress.  NAD  HENT:  Mouth/Throat: Oropharynx is clear and moist.  Eyes: Conjunctivae are normal. Pupils are equal, round, and reactive to light.  Neck: Normal range of motion. No JVD present. No thyromegaly present.  Cardiovascular: Normal rate, regular rhythm, normal heart sounds and intact distal pulses.  Exam reveals no gallop and no friction rub.   No murmur heard. Pulmonary/Chest: Effort normal and breath sounds normal. No respiratory distress. He has no wheezes. He has no rales. He exhibits no tenderness.  Abdominal: Soft. Bowel sounds are normal. He exhibits no distension and no mass. There is no tenderness. There is no rebound and no guarding.  Musculoskeletal: Normal range of motion. He exhibits tenderness. He exhibits no edema.  Lymphadenopathy:    He has no cervical adenopathy.  Neurological: He is alert and oriented to person, place, and time. He has normal reflexes. No cranial nerve deficit. He exhibits normal muscle tone. He displays a negative Romberg sign. Coordination abnormal. Gait normal.  Skin: Skin is warm and  dry. No rash noted.  Psychiatric: He has a normal mood and affect. His behavior is normal. Thought content normal.  Using a cane Alert, cooperative Disoriented to time  Lab Results  Component Value Date   WBC 5.2 03/10/2015   HGB 13.2 03/10/2015   HCT 39.4 03/10/2015   PLT 135* 03/10/2015   GLUCOSE 106* 03/10/2015   CHOL 168 04/18/2013   TRIG 197.0* 04/18/2013   HDL 34.00* 04/18/2013   LDLDIRECT 137.9 05/11/2010   LDLCALC 95 04/18/2013   ALT 18 04/18/2013   AST 20 04/18/2013   NA 140 03/10/2015   K 4.5 03/10/2015   CL 107 03/10/2015   CREATININE 1.22 03/10/2015   BUN 13 03/10/2015   CO2 27 03/10/2015   TSH 2.11 11/13/2014   PSA 1.61 10/18/2012   INR 3.0 03/11/2015   HGBA1C 5.8 11/13/2014    Dg Chest 2 View  03/10/2015   CLINICAL DATA:  Chest pain.  Question confusion.  EXAM: CHEST  2 VIEW  COMPARISON:  10/08/2011  FINDINGS: Subtle densities in the left lower lung are nonspecific but could represent chronic changes or atelectasis. No significant airspace disease. Heart and mediastinum are within normal limits. The trachea is midline. Atherosclerotic calcifications at  the aortic arch. No large pleural effusions. Degenerative changes in thoracic spine.  IMPRESSION: Subtle left basilar densities could represent atelectasis or chronic changes. No significant airspace disease.   Electronically Signed   By: Markus Daft M.D.   On: 03/10/2015 12:47    Assessment & Plan:   There are no diagnoses linked to this encounter. I am having Mr. Kinsel maintain his cholecalciferol, aspirin EC, folic acid, albuterol, loratadine, memantine, omeprazole, donepezil, verapamil, losartan, cloNIDine, warfarin, fluticasone, ipratropium, and tamsulosin. We will continue to administer methylPREDNISolone acetate and methylPREDNISolone acetate.  No orders of the defined types were placed in this encounter.     Follow-up: No Follow-up on file.  Walker Kehr, MD

## 2015-03-12 NOTE — Progress Notes (Signed)
Pre visit review using our clinic review tool, if applicable. No additional management support is needed unless otherwise documented below in the visit note. 

## 2015-03-16 DIAGNOSIS — M170 Bilateral primary osteoarthritis of knee: Secondary | ICD-10-CM | POA: Diagnosis not present

## 2015-03-17 ENCOUNTER — Telehealth: Payer: Self-pay | Admitting: Cardiovascular Disease

## 2015-03-17 DIAGNOSIS — R079 Chest pain, unspecified: Secondary | ICD-10-CM

## 2015-03-17 NOTE — Telephone Encounter (Signed)
Needs f/u lexiscan myovue

## 2015-03-17 NOTE — Telephone Encounter (Signed)
New Message       Pt's wife calling stating that pt was seen at the hospital and told that he needed a stress test, there is no order in Epic. Please call back and advise.

## 2015-03-17 NOTE — Telephone Encounter (Signed)
Pts spouse calling to see if Dr Johnsie Cancel has placed the order for the pt to have an outpatient stress test done, due to recent visit to the ER for chest pain.  Per the pts spouse, she states that the ED MD and Dr Johnsie Cancel spoke about the pt getting an outpatient stress test ordered and done, but the wife has not heard anything from our office about this.  Informed the wife that this it is noted under the pts ED notes from 03/10/15 that Dr Johnsie Cancel does want an outpatient stress test for the patient to have done.  Informed the wife that I will send Dr Johnsie Cancel and his nurse a message requesting an order for an outpatient stress test, and once obtained, someone from our office will follow-up with her thereafter.  Per the pts wife, she request that Dr Johnsie Cancel order a lexiscan vs exercise myoview, due to the pt has difficulty with ambulating.  Wife verbalized understanding and agrees with this plan.

## 2015-03-18 NOTE — Telephone Encounter (Signed)
PT'S SPOUSE AWARE  THAT  SCHEDULERS  WILL BE CALLING   TO  MAKE  APPT  FOR LEXISCAN./CY

## 2015-03-18 NOTE — Telephone Encounter (Signed)
Order entered .Adonis Housekeeper

## 2015-03-18 NOTE — Telephone Encounter (Signed)
LEXISCAN  SCHEDULED  FOR 02-27-15./CY

## 2015-03-19 ENCOUNTER — Ambulatory Visit (INDEPENDENT_AMBULATORY_CARE_PROVIDER_SITE_OTHER): Payer: Medicare Other | Admitting: Neurology

## 2015-03-19 ENCOUNTER — Encounter: Payer: Self-pay | Admitting: Neurology

## 2015-03-19 VITALS — BP 136/69 | HR 53 | Resp 16 | Ht 67.0 in | Wt 171.0 lb

## 2015-03-19 DIAGNOSIS — M25561 Pain in right knee: Secondary | ICD-10-CM

## 2015-03-19 DIAGNOSIS — G309 Alzheimer's disease, unspecified: Secondary | ICD-10-CM | POA: Diagnosis not present

## 2015-03-19 DIAGNOSIS — F0280 Dementia in other diseases classified elsewhere without behavioral disturbance: Secondary | ICD-10-CM

## 2015-03-19 MED ORDER — MEMANTINE HCL ER 28 MG PO CP24: 28.0000 mg | ORAL_CAPSULE | Freq: Every day | ORAL | Status: DC

## 2015-03-19 NOTE — Patient Instructions (Signed)
We will continue with your 2 memory medications. Follow up in 6 months. Drink more water use your cane at all times.

## 2015-03-19 NOTE — Progress Notes (Signed)
Subjective:    Patient ID: Douglas [name] is a 79 y.o. male.  HPI     Interim history:  Douglas [name] is a very pleasant 79 year old right-handed gentleman with an underlying medical history of hypertension, kidney stones, lupus anticoagulant, hyperlipidemia, left-sided stroke in 2004, vitamin D deficiency, reflux disease, prostate hypertrophy, colonic polyps, right knee pain and status post rotator cuff surgery bilaterally, who presents for followup consultation of his dementia. He is accompanied by his daughter, Douglas [name], today and presents for a sooner than scheduled appointment d/t more confusion, recent falls x 2 some 2 weeks ago and more gait insecurity. I last saw him on 11/30/2014, at which time his daughter reported that he had had 2 falls within a week recently. Both times he fell in the bathroom. He did not sustain any injuries thankfully. Since then they made accommodations in their bathrooms and removed all rugs to avoid tripping. He was using a cane more consistently and had a walker available. I asked him to continue with generic Aricept and Namenda XR. His MMSE was 16 out of 30 at the time, clock drawing was 1 out of 4, animal fluency was 8/m. I ordered an MRI brain. We called his wife with the test results.  Today, 03/19/2015: He reports fairly well. He has improved knee pain since he started Synvisc injections. He had 2 injections so far and will get a third one soon. He will then have the option of having another round of injections 6 months later as I understand. Additional information is provided by his daughter. She reports no recent falls. His appetite is okay. He continues to tolerate his memory medications. He had a brain MRI without contrast on 12/12/2014: This is an abnormal MRI of the brain without contrast showing prior strokes in the left occipital and right parieto- occipital lobes. These were present on CT scan dated 10/08/2011. Additionally, there are severe chronic  small vessel ischemic changes in both hemispheres. When compared to a CT scan dated 10/08/2011, there has been some progression of the extent of cortical atrophy. There are no acute findings on the current study. Additional note is made of chronic left maxillary and ethmoid sinusitis, similar to what was seen on the CT scan in 2013. I personally reviewed the images through the PACS system. Of note, he went to the emergency room on 03/10/2015 and I reviewed the records. He presented with chest pain. Workup in the emergency room was negative. He is seeing a cardiologist and is scheduled later this month for a stress test.  Previously:  I saw him on 09/25/2014, at which time his MMSE was 16, clock drawing was 1, AFT was 8. He stated he had good and bad days. I changed his medication to Namzaric once daily.  I saw him on 03/20/14, at which time he reported doing well overall, but his knees were bothering him. He had a steroid injection into the R knee in 4/15, which helped some. He saw Dr. Mardelle Matte. Sadly, his middle daughter (adoptive) died of lung cancer at age 56 in March 2015. On a positive note, his youngest daughter, Douglas [name], was getting married. His oldest Daughter, Douglas [name], lives in [place]. His MMSE was 20/30, CDT 1/4, AFT 11/min in July 2015. We continued Namenda long-acting at 28 mg once daily and Aricept at 10 mg once daily.  I saw him on 10/24/2013, at which time I asked him to continue with Namenda long-acting 28 mg daily and Aricept 10 mg daily.  I saw him on 04/18/2013, at which time I increased his Namenda XR to 21 mg daily. However they were not able to fill it because of shortage of the medication and he has been on Namenda XR 28 mg since November 2014, per PCP.   I first met him on 01/10/2013, at which time his MMSE was 21, clock drawing was 2, animal fluency was 12. I felt that he most likely has Alzheimer's dementia. He had some decline in his MMSE score. I asked him to start Namenda  XR 7 mg once daily for one month then 14 mg once daily thereafter. I also asked him to see Dr. Valentina Shaggy for neurocognitive testing. He had that consultation in July 2014: In essence, findings were conclusive with mild Alzheimer's dementia. He did well with the addition of Namenda. He was tolerating it well and his wife was particularly pleased to see that he was thinking more clearly and was less irritable and less angry all the time. It had made a significant difference in his cognitive functioning, they both agreed and he reported no SEs.    He previously followed with Dr. Morene Antu and was last seen by him on 07/19/2012, which time Dr. Erling Cruz felt the patient had mild dementia and discussed dementia related studies. While he did not change his medication he asked the patient to take his generic Aricept during the day rather than later at night.   He has an approximately 6 year history of memory loss, particularly forgetfulness. MRI brain with and without contrast in February 2009 showed an old infarct in the left occipital lobe, mild atrophy, and moderate small vessel disease. CT head without contrast in July 2009 and March 2012 showed no other new abnormalities. Vitamin B12 level and TSH were normal in March 2012. The patient is exercising regularly. He is independent in his daily living and drives a car. She has some depression but is not on an antidepressant. In August 2012 his MMSE was 27, clock drawing was 4, animal fluency was 14. In February 2013 his MMSE was 23, clock drawing was 4, animal fluency was 12. In July 2013 his MMSE was 23, clock drawing was 3, animal fluency was 8.    His Past Medical History Is Significant For: Past Medical History  Diagnosis Date  . GERD 03/15/2007    erosive esophagitis  . HYPERTENSION 03/15/2007  . HYPERLIPIDEMIA 10/25/2007  . OSTEOARTHRITIS 03/15/2007    right knee  . CVA (cerebral infarction) 10/27/2010  . CEREBROVASCULAR ACCIDENT, HX OF 05/17/2007  . COLONIC  POLYPS, HX OF 10/25/2007  . 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 BENIGN PROSTATIC HYPERTROPHY 08/12/2007  . PERIPHERAL VASCULAR DISEASE 10/16/2007    carotids < 70%  . TRANSIENT ISCHEMIC ATTACK 03/19/2008  . Nephrolithiasis   . Vitamin D deficiency   . Acquired deviated nasal septum   . Chronic maxillary sinusitis   . Chronic ethmoidal sinusitis   . Blood disorder   . Chronic anticoagulation   . Lupus anticoagulant disorder     initially diagnosed in 2004 after CVA  . Hiatal hernia   . Stroke   . Allergy   . Clotting disorder     clotting disorder    His Past Surgical History Is Significant For: Past Surgical History  Procedure Laterality Date  . Lung biopsy  1980    s/p  . Nasal sinus surgery      s/p   . Lithotripsy      s/p  .  Rotator cuff repair      x 2  . Nasal septal deviation repair    . Colonoscopy      His Family History Is Significant For: Family History  Problem Relation Age of Onset  . Alzheimer's disease Father   . Heart disease Father   . Hypertension Other   . Allergies    . Hearing loss    . Hypertension    . Rheum arthritis    . Stroke Paternal Grandmother   . Heart attack Maternal Grandfather   . Leukemia Mother   . Colon cancer Neg Hx   . Esophageal cancer Neg Hx   . Stomach cancer Neg Hx   . Rectal cancer Neg Hx     His Social History Is Significant For: History   Social History  . Marital Status: Married    Spouse Name: N/A  . Number of Children: N/A  . Years of Education: N/A   Occupational History  . [employer]IRED [name]   Social History Main Topics  . Smoking status: Former Smoker -- 0.50 packs/day for 20 years    Types: Cigarettes    Quit date: 08/28/1973  . Smokeless tobacco: Former Systems developer    Types: [place] date: 08/28/1960  . Alcohol Use: 0.0 oz/week    0 Standard drinks or equivalent per week     Comment: social  . Drug Use: Yes    Special: Methaqualone  . Sexual Activity: Yes   Other Topics  Concern  . None   Social History Narrative    His Allergies Are:  Allergies  Allergen Reactions  . Cefuroxime Axetil Other (See Comments)    bad taste  . Hydrochlorothiazide Other (See Comments)    unknown  . Iohexol Hives       . Lovastatin Other (See Comments)    cramps  . Niacin Other (See Comments)    burning  . Pseudoeph-Doxylamine-Dm-Apap Other (See Comments)    tremor  . Ramipril Cough  . Tramadol Hcl Other (See Comments)    hallucinating  . Viagra [Sildenafil Citrate]     Feverish feeling  :   His Current Medications Are:  Outpatient Encounter Prescriptions as of 03/19/2015  Medication Sig  . albuterol (PROAIR HFA) 108 (90 BASE) MCG/ACT inhaler Inhale 2 puffs into the lungs 4 (four) times daily as needed. For wheezing  . aspirin EC 81 MG tablet Take 81 mg by mouth daily.  . cholecalciferol (VITAMIN D) 1000 UNITS tablet Take 1,000 Units by mouth daily.    . cloNIDine (CATAPRES) 0.1 MG tablet Take 1 tablet (0.1 mg total) by mouth 2 (two) times daily.  Marland Kitchen donepezil (ARICEPT) 10 MG tablet TAKE 1 TABLET (10 MG TOTAL) BY MOUTH EVERY MORNING.  . fluticasone (FLONASE) 50 MCG/ACT nasal spray Place 2 sprays into both nostrils daily.  . folic acid (FOLVITE) 366 MCG tablet Take 800 mcg by mouth daily.  Marland Kitchen ipratropium (ATROVENT) 0.06 % nasal spray Place 2 sprays into the nose 4 (four) times daily.  Marland Kitchen loratadine (CLARITIN) 10 MG tablet Take 1 tablet (10 mg total) by mouth daily.  Marland Kitchen losartan (COZAAR) 100 MG tablet TAKE 1 TABLET (100 MG TOTAL) BY MOUTH DAILY.  Marland Kitchen Memantine HCl ER (NAMENDA XR) 28 MG CP24 Take 28 mg by mouth daily.  Marland Kitchen omeprazole (PRILOSEC) 40 MG capsule TAKE ONE CAPSULE BY MOUTH TWICE DAILY  . tamsulosin (FLOMAX) 0.4 MG CAPS capsule TAKE 1 CAPSULE (0.4 MG TOTAL) BY MOUTH DAILY.  Marland Kitchen  verapamil (CALAN-SR) 180 MG CR tablet TAKE 1 TABLET (180 MG TOTAL) BY MOUTH DAILY.  Marland Kitchen warfarin (COUMADIN) 2.5 MG tablet TAKE 2 TABLETS BY MOUTH DAILY, EXCEPT 1 TABLET ON MONDAYS,  WEDNESDAYS,AND FRIDAYS, OR AS DIRECTED BY COUMADIN CLINIC (Patient taking differently: 2.5mg /day except take 5mg  on Tuesday, Thursday and Saturday)   Facility-Administered Encounter Medications as of 03/19/2015  Medication  . methylPREDNISolone acetate (DEPO-MEDROL) injection 80 mg  . methylPREDNISolone acetate (DEPO-MEDROL) injection 80 mg  :  Review of Systems:  Out of a complete 14 point review of systems, all are reviewed and negative with the exception of these symptoms as listed below:   Review of Systems  All other systems reviewed and are negative.   Objective:  Neurologic Exam  Physical Exam Physical Examination:   Filed Vitals:   03/19/15 1053  BP: 136/69  Pulse: 53  Resp: 16    General Examination: The patient is a very pleasant 79 y.o. male in no acute distress. He is calm and cooperative with the exam. He denies Auditory Hallucinations and Visual Hallucinations. He is a little bit more quiet today.  HEENT: Normocephalic, atraumatic, pupils are equal, round and reactive to light and accommodation. Funduscopic exam is normal with sharp disc margins noted. Extraocular tracking shows mild saccadic breakdown without nystagmus noted. Hearing is intact. Face is symmetric with no facial masking and normal facial sensation. There is no lip, neck or jaw tremor. Neck is not rigid with intact passive ROM. There are no carotid bruits on auscultation. Oropharynx exam reveals mild mouth dryness. No significant airway crowding is noted. Mallampati is class II. Tongue protrudes centrally and palate elevates symmetrically.    Chest: is clear to auscultation without wheezing, rhonchi or crackles noted.  Heart: sounds are regular and normal without murmurs, rubs or gallops noted.   Abdomen: is soft, non-tender and non-distended with normal bowel sounds appreciated on auscultation.  Extremities: There is trace pitting edema in the distal lower extremities bilaterally, right more than  left.   Skin: is warm and dry with no trophic changes noted.  Musculoskeletal: exam reveals no obvious joint deformities, tenderness or joint swelling or erythema, except b/l knee pain, more on R.  Neurologically:  Mental status: The patient is awake and alert, paying good  attention. He is able to partially provide the history. His daughter provides details. He is oriented to: person, place, situation and day of week.  His memory, attention, language and knowledge are impaired. MMSE: 21/30, CDT: 1/4, and AFT: 8/min.  There is no aphasia, agnosia, apraxia or anomia. There is a mild degree of bradyphrenia. Speech is not hypophonic with no dysarthria noted. Mood is congruent and affect is normal. He does seem to be a little bit more quiet today and has difficulty relating his history.  His MMSE score in May 2014 was 21/30, CDT 2/4, AFT (Animal Fluency Test) score was 12.    On 03/20/2014: MMSE: 20/30, CDT: 1/4, AFT: 11  On 09/25/2014: MMSE: 16/30, CDT: 1/4, AFT: 8, GDS: 3/15.  On 03/19/2015: MMSE: 17/30, CDT: 1/4, AFT: 7/min.   Cranial nerves are as described above under HEENT exam. In addition, shoulder shrug is normal with equal shoulder height noted.  Motor exam: Normal bulk, and strength for age is noted. Tone is not rigid with absence of cogwheeling. There is overall no bradykinesia. There is no drift or rebound. There is no tremor. Romberg is negative. Reflexes are 1+ in the upper extremities and 1+ in the lower extremities.  Toes are downgoing bilaterally. Fine motor skills: Finger taps, hand movements, and rapid alternating patting are mildly impaired bilaterally. Foot taps and foot agility are mildly impaired bilaterally.   Cerebellar testing shows no dysmetria or intention tremor on finger to nose testing. Heel to shin is difficult for him, especially on the R. There is no truncal or gait ataxia.   Sensory exam is intact to light touch, pinprick, vibration, temperature sense in the upper  and lower extremities.   Gait, station and balance: He stands up from the seated position with no difficulty and needs no assistance. No veering to one side is noted. No leaning to one side. Posture is mildly stooped in the low back. Stance is wide-based. He turns in 3 steps. Tandem walk is not possible. Balance is mildly impaired. He has a single point cane and has a mild limp on the R. He does seem to walk a little bit better today.  Assessment and Plan:   In summary, Douglas [name] is a very pleasant 79 year old male with an underlying medical history of hypertension, kidney stones, lupus anticoagulant (on coumadin), hyperlipidemia, left-sided stroke in 2004, vitamin D deficiency, reflux disease, prostate hypertrophy, colonic polyps, b/l knee pain and status post rotator cuff surgery bilaterally, who presents for followup consultation of his Alzheimer's dementia, on dual dementia medications with generic Aricept at 10 mg [place] and Namenda long-acting at 28 mg daily. He has been able to tolerate medications and scores and clinical presentations are thankfully stable in the last few months. Thankfully he has not fallen again. He uses his cane more consistently. He also feels improved in his knee pain since he started Synvisc injections. He is advised to drink more water. He had a brain MRI on 12/12/2014 which showed no acute findings, prior strokes, more advanced small vessel disease and more advanced cortical atrophy when compared to a prior head CT. He's had some adjustments to help with his ADLs at home. I asked him to stay well-hydrated and use his cane or walker at all times for safety. He had neuropsychological testing in July 2014, and the conclusion was consistent with mild Alzheimer's dementia at the time. While we can consider repeating this later this year or next year, it may not change our management.  I suggested a 6 month follow-up, sooner if needed. I answered all her questions today  and the patient and his daughter were in agreement. I spent 20 in total face-to-face time with the patient, more 50% of which was spent in counseling and coordination of care, reviewing test results, reviewing medication and reviewing the diagnosis of AD, its prognosis and treatment options.

## 2015-03-23 ENCOUNTER — Telehealth (HOSPITAL_COMMUNITY): Payer: Self-pay | Admitting: *Deleted

## 2015-03-23 DIAGNOSIS — M170 Bilateral primary osteoarthritis of knee: Secondary | ICD-10-CM | POA: Diagnosis not present

## 2015-03-23 NOTE — Telephone Encounter (Signed)
Spoke with wife of Patient and she was given detailed instructions per Myocardial Perfusion Study Information Sheet for test on 03/26/15 at 1145. Patient Notified to arrive 15 minutes early, and that it is imperative to arrive on time for appointment to keep from having the test rescheduled. Patient verbalized understanding. Margaree Sandhu, Ranae [place]ms

## 2015-03-26 ENCOUNTER — Ambulatory Visit (HOSPITAL_COMMUNITY): Payer: Medicare Other | Attending: Cardiology

## 2015-03-26 DIAGNOSIS — Z8673 Personal history of transient ischemic attack (TIA), and cerebral infarction without residual deficits: Secondary | ICD-10-CM | POA: Diagnosis not present

## 2015-03-26 DIAGNOSIS — I10 Essential (primary) hypertension: Secondary | ICD-10-CM | POA: Diagnosis not present

## 2015-03-26 DIAGNOSIS — M329 Systemic lupus erythematosus, unspecified: Secondary | ICD-10-CM | POA: Insufficient documentation

## 2015-03-26 DIAGNOSIS — I2510 Atherosclerotic heart disease of native coronary artery without angina pectoris: Secondary | ICD-10-CM | POA: Diagnosis not present

## 2015-03-26 DIAGNOSIS — R079 Chest pain, unspecified: Secondary | ICD-10-CM | POA: Diagnosis not present

## 2015-03-26 LAB — MYOCARDIAL PERFUSION IMAGING
CHL CUP NUCLEAR SDS: 2
CHL CUP NUCLEAR SRS: 4
LV dias vol: 99 mL
LV sys vol: 41 mL
NUC STRESS TID: 0.94
Peak HR: 83 {beats}/min
RATE: 0.31
Rest HR: 55 {beats}/min
SSS: 6

## 2015-03-26 MED ORDER — REGADENOSON 0.4 MG/5ML IV SOLN
0.4000 mg | Freq: Once | INTRAVENOUS | Status: AC
  Administered 2015-03-26: 0.4 mg via INTRAVENOUS

## 2015-03-26 MED ORDER — TECHNETIUM TC 99M SESTAMIBI GENERIC - CARDIOLITE
31.7000 | Freq: Once | INTRAVENOUS | Status: AC | PRN
  Administered 2015-03-26: 31.7 via INTRAVENOUS

## 2015-03-26 MED ORDER — TECHNETIUM TC 99M SESTAMIBI GENERIC - CARDIOLITE
9.6000 | Freq: Once | INTRAVENOUS | Status: AC | PRN
  Administered 2015-03-26: 10 via INTRAVENOUS

## 2015-04-01 ENCOUNTER — Ambulatory Visit (INDEPENDENT_AMBULATORY_CARE_PROVIDER_SITE_OTHER): Payer: Medicare Other | Admitting: *Deleted

## 2015-04-01 DIAGNOSIS — Z5181 Encounter for therapeutic drug level monitoring: Secondary | ICD-10-CM | POA: Diagnosis not present

## 2015-04-01 DIAGNOSIS — I639 Cerebral infarction, unspecified: Secondary | ICD-10-CM | POA: Diagnosis not present

## 2015-04-01 DIAGNOSIS — Z8673 Personal history of transient ischemic attack (TIA), and cerebral infarction without residual deficits: Secondary | ICD-10-CM

## 2015-04-01 DIAGNOSIS — Z8679 Personal history of other diseases of the circulatory system: Secondary | ICD-10-CM | POA: Diagnosis not present

## 2015-04-01 DIAGNOSIS — G459 Transient cerebral ischemic attack, unspecified: Secondary | ICD-10-CM | POA: Diagnosis not present

## 2015-04-01 LAB — POCT INR: INR: 3.4

## 2015-04-10 ENCOUNTER — Other Ambulatory Visit: Payer: Self-pay | Admitting: Cardiology

## 2015-04-15 ENCOUNTER — Ambulatory Visit (INDEPENDENT_AMBULATORY_CARE_PROVIDER_SITE_OTHER): Payer: Medicare Other | Admitting: *Deleted

## 2015-04-15 DIAGNOSIS — Z5181 Encounter for therapeutic drug level monitoring: Secondary | ICD-10-CM

## 2015-04-15 DIAGNOSIS — I639 Cerebral infarction, unspecified: Secondary | ICD-10-CM

## 2015-04-15 DIAGNOSIS — G459 Transient cerebral ischemic attack, unspecified: Secondary | ICD-10-CM | POA: Diagnosis not present

## 2015-04-15 DIAGNOSIS — Z8673 Personal history of transient ischemic attack (TIA), and cerebral infarction without residual deficits: Secondary | ICD-10-CM

## 2015-04-15 DIAGNOSIS — Z8679 Personal history of other diseases of the circulatory system: Secondary | ICD-10-CM

## 2015-04-15 LAB — POCT INR: INR: 2.1

## 2015-04-23 DIAGNOSIS — H2513 Age-related nuclear cataract, bilateral: Secondary | ICD-10-CM | POA: Diagnosis not present

## 2015-05-04 DIAGNOSIS — M170 Bilateral primary osteoarthritis of knee: Secondary | ICD-10-CM | POA: Diagnosis not present

## 2015-05-06 ENCOUNTER — Ambulatory Visit (INDEPENDENT_AMBULATORY_CARE_PROVIDER_SITE_OTHER): Payer: Medicare Other

## 2015-05-06 DIAGNOSIS — G459 Transient cerebral ischemic attack, unspecified: Secondary | ICD-10-CM | POA: Diagnosis not present

## 2015-05-06 DIAGNOSIS — I639 Cerebral infarction, unspecified: Secondary | ICD-10-CM

## 2015-05-06 DIAGNOSIS — Z8679 Personal history of other diseases of the circulatory system: Secondary | ICD-10-CM

## 2015-05-06 DIAGNOSIS — Z5181 Encounter for therapeutic drug level monitoring: Secondary | ICD-10-CM

## 2015-05-06 DIAGNOSIS — Z8673 Personal history of transient ischemic attack (TIA), and cerebral infarction without residual deficits: Secondary | ICD-10-CM

## 2015-05-06 LAB — POCT INR: INR: 2.9

## 2015-05-30 ENCOUNTER — Other Ambulatory Visit: Payer: Self-pay | Admitting: Internal Medicine

## 2015-06-03 ENCOUNTER — Ambulatory Visit (INDEPENDENT_AMBULATORY_CARE_PROVIDER_SITE_OTHER): Payer: Medicare Other | Admitting: *Deleted

## 2015-06-03 ENCOUNTER — Ambulatory Visit (INDEPENDENT_AMBULATORY_CARE_PROVIDER_SITE_OTHER): Payer: Medicare Other

## 2015-06-03 DIAGNOSIS — I639 Cerebral infarction, unspecified: Secondary | ICD-10-CM | POA: Diagnosis not present

## 2015-06-03 DIAGNOSIS — Z5181 Encounter for therapeutic drug level monitoring: Secondary | ICD-10-CM | POA: Diagnosis not present

## 2015-06-03 DIAGNOSIS — G459 Transient cerebral ischemic attack, unspecified: Secondary | ICD-10-CM

## 2015-06-03 DIAGNOSIS — Z8679 Personal history of other diseases of the circulatory system: Secondary | ICD-10-CM | POA: Diagnosis not present

## 2015-06-03 DIAGNOSIS — Z23 Encounter for immunization: Secondary | ICD-10-CM | POA: Diagnosis not present

## 2015-06-03 DIAGNOSIS — Z8673 Personal history of transient ischemic attack (TIA), and cerebral infarction without residual deficits: Secondary | ICD-10-CM

## 2015-06-03 LAB — POCT INR: INR: 2.5

## 2015-06-04 ENCOUNTER — Ambulatory Visit: Payer: Medicare Other

## 2015-06-06 ENCOUNTER — Other Ambulatory Visit: Payer: Self-pay | Admitting: Internal Medicine

## 2015-06-18 ENCOUNTER — Ambulatory Visit: Payer: Medicare Other | Admitting: Internal Medicine

## 2015-06-20 ENCOUNTER — Other Ambulatory Visit: Payer: Self-pay | Admitting: Cardiovascular Disease

## 2015-06-22 DIAGNOSIS — M170 Bilateral primary osteoarthritis of knee: Secondary | ICD-10-CM | POA: Diagnosis not present

## 2015-07-09 ENCOUNTER — Ambulatory Visit (INDEPENDENT_AMBULATORY_CARE_PROVIDER_SITE_OTHER): Payer: Medicare Other | Admitting: Internal Medicine

## 2015-07-09 ENCOUNTER — Encounter: Payer: Self-pay | Admitting: Internal Medicine

## 2015-07-09 ENCOUNTER — Other Ambulatory Visit (INDEPENDENT_AMBULATORY_CARE_PROVIDER_SITE_OTHER): Payer: Medicare Other

## 2015-07-09 VITALS — BP 130/70 | HR 66 | Wt 169.0 lb

## 2015-07-09 DIAGNOSIS — F0391 Unspecified dementia with behavioral disturbance: Secondary | ICD-10-CM

## 2015-07-09 DIAGNOSIS — J4520 Mild intermittent asthma, uncomplicated: Secondary | ICD-10-CM

## 2015-07-09 DIAGNOSIS — Z23 Encounter for immunization: Secondary | ICD-10-CM | POA: Diagnosis not present

## 2015-07-09 LAB — BASIC METABOLIC PANEL
BUN: 18 mg/dL (ref 6–23)
CALCIUM: 9.3 mg/dL (ref 8.4–10.5)
CO2: 28 mEq/L (ref 19–32)
Chloride: 108 mEq/L (ref 96–112)
Creatinine, Ser: 1.11 mg/dL (ref 0.40–1.50)
GFR: 68.28 mL/min (ref >60.00)
Glucose, Bld: 104 mg/dL — ABNORMAL HIGH (ref 70–99)
POTASSIUM: 4.6 meq/L (ref 3.5–5.1)
SODIUM: 142 meq/L (ref 135–145)

## 2015-07-09 MED ORDER — DICLOFENAC SODIUM 1 % TD GEL: TRANSDERMAL | Status: AC

## 2015-07-09 MED ORDER — DICLOFENAC SODIUM 1 % TD GEL: 2.0000 g | Freq: Four times a day (QID) | TRANSDERMAL | Status: DC

## 2015-07-09 MED ORDER — OMEPRAZOLE 40 MG PO CPDR: DELAYED_RELEASE_CAPSULE | ORAL | Status: AC

## 2015-07-09 NOTE — Progress Notes (Signed)
Subjective:  Patient ID: Douglas [name], male    DOB: Dec 24, 1935  Age: 79 y.o. MRN: [id number]  CC: No chief complaint on file.   HPI Douglas [name] presents for dementia: not eating, problems feeding himself, wouldn't drink  Outpatient Prescriptions Prior to Visit  Medication Sig Dispense Refill  . albuterol (PROAIR HFA) 108 (90 BASE) MCG/ACT inhaler Inhale 2 puffs into the lungs 4 (four) times daily as needed. For wheezing 8.5 g 3  . aspirin EC 81 MG tablet Take 81 mg by mouth daily.    . cholecalciferol (VITAMIN D) 1000 UNITS tablet Take 1,000 Units by mouth daily.      . cloNIDine (CATAPRES) 0.1 MG tablet Take 1 tablet (0.1 mg total) by mouth 2 (two) times daily. 60 tablet 11  . donepezil (ARICEPT) 10 MG tablet TAKE 1 TABLET (10 MG TOTAL) BY MOUTH EVERY MORNING. 90 tablet 3  . fluticasone (FLONASE) 50 MCG/ACT nasal spray Place 2 sprays into both nostrils daily. 16 g 11  . folic acid (FOLVITE) [id number] MCG tablet Take 800 mcg by mouth daily.    Marland Kitchen ipratropium (ATROVENT) 0.06 % nasal spray Place 2 sprays into the nose 4 (four) times daily. 15 mL 11  . loratadine (CLARITIN) 10 MG tablet Take 1 tablet (10 mg total) by mouth daily. 100 tablet 3  . losartan (COZAAR) 100 MG tablet TAKE 1 TABLET (100 MG TOTAL) BY MOUTH DAILY. 30 tablet 5  . memantine (NAMENDA XR) 28 MG CP24 24 hr capsule Take 1 capsule (28 mg total) by mouth daily. 90 capsule 3  . tamsulosin (FLOMAX) 0.4 MG CAPS capsule TAKE 1 CAPSULE (0.4 MG TOTAL) BY MOUTH DAILY. 90 capsule 3  . verapamil (CALAN-SR) 180 MG CR tablet TAKE 1 TABLET (180 MG TOTAL) BY MOUTH DAILY. 30 tablet 5  . warfarin (COUMADIN) 2.5 MG tablet TAKE 2 TABLETS BY MOUTH DAILY, EXCEPT 1 TABLET ON MONDAYS, WEDNESDAYS,AND FRIDAYS, OR AS DIRECTED BY COUMADIN CLINIC 120 tablet 0  . omeprazole (PRILOSEC) 40 MG capsule TAKE ONE CAPSULE BY MOUTH TWICE DAILY 180 capsule 3  . VOLTAREN 1 % GEL APPLY 2 GRAMS TOPICALLY 4 TIMES A DAY AS NEEDED TO BOTH KNEES 100 g 1    Facility-Administered Medications Prior to Visit  Medication Dose Route Frequency Provider Last Rate Last Dose  . methylPREDNISolone acetate (DEPO-MEDROL) injection 80 mg  80 mg Intra-articular Once Aleksei Plotnikov V, MD      . methylPREDNISolone acetate (DEPO-MEDROL) injection 80 mg  80 mg Intra-articular Once Aleksei Plotnikov V, MD        ROS Review of Systems  Constitutional: Negative for appetite change, fatigue and unexpected weight change.  HENT: Negative for congestion, nosebleeds, sneezing, sore throat and trouble swallowing.   Eyes: Negative for itching and visual disturbance.  Respiratory: Negative for cough.   Cardiovascular: Negative for chest pain, palpitations and leg swelling.  Gastrointestinal: Negative for nausea, diarrhea, blood in stool and abdominal distention.  Genitourinary: Negative for frequency and hematuria.  Musculoskeletal: Negative for back pain, joint swelling, gait problem and neck pain.  Skin: Negative for rash.  Neurological: Negative for dizziness, tremors, speech difficulty and weakness.  Psychiatric/Behavioral: Positive for confusion and decreased concentration. Negative for sleep disturbance, dysphoric mood and agitation. The patient is not nervous/anxious.     Objective:  BP 130/70 mmHg  Pulse 66  Wt 169 lb (76.658 kg)  SpO2 96%  BP Readings from Last 3 Encounters:  07/09/15 130/70  03/19/15 136/69  03/12/15 124/60  Wt Readings from Last 3 Encounters:  07/09/15 169 lb (76.658 kg)  03/26/15 166 lb (75.297 kg)  03/19/15 171 lb (77.565 kg)    Physical Exam  Constitutional: He is oriented to person, place, and time. He appears well-developed. No distress.  NAD  HENT:  Mouth/Throat: Oropharynx is clear and moist.  Eyes: Conjunctivae are normal. Pupils are equal, round, and reactive to light.  Neck: Normal range of motion. No JVD present. No thyromegaly present.  Cardiovascular: Normal rate, regular rhythm, normal heart sounds and  intact distal pulses.  Exam reveals no gallop and no friction rub.   No murmur heard. Pulmonary/Chest: Effort normal and breath sounds normal. No respiratory distress. He has no wheezes. He has no rales. He exhibits no tenderness.  Abdominal: Soft. Bowel sounds are normal. He exhibits no distension and no mass. There is no tenderness. There is no rebound and no guarding.  Musculoskeletal: Normal range of motion. He exhibits no edema or tenderness.  Lymphadenopathy:    He has no cervical adenopathy.  Neurological: He is alert and oriented to person, place, and time. He has normal reflexes. No cranial nerve deficit. He exhibits normal muscle tone. He displays a negative Romberg sign. Coordination and gait normal.  Skin: Skin is warm and dry. No rash noted.  Psychiatric: He has a normal mood and affect. His behavior is normal.  disoriented, tangential thoghts  Lab Results  Component Value Date   WBC 5.2 03/10/2015   HGB 13.2 03/10/2015   HCT 39.4 03/10/2015   PLT 135* 03/10/2015   GLUCOSE 104* 07/09/2015   CHOL 168 04/18/2013   TRIG 197.0* 04/18/2013   HDL 34.00* 04/18/2013   LDLDIRECT 137.9 05/11/2010   LDLCALC 95 04/18/2013   ALT 18 04/18/2013   AST 20 04/18/2013   NA 142 07/09/2015   K 4.6 07/09/2015   CL 108 07/09/2015   CREATININE 1.11 07/09/2015   BUN 18 07/09/2015   CO2 28 07/09/2015   TSH 2.11 11/13/2014   PSA 1.61 10/18/2012   INR 2.9 07/15/2015   HGBA1C 5.8 11/13/2014    Dg Chest 2 View  03/10/2015  CLINICAL DATA:  Chest pain.  Question confusion. EXAM: CHEST  2 VIEW COMPARISON:  10/08/2011 FINDINGS: Subtle densities in the left lower lung are nonspecific but could represent chronic changes or atelectasis. No significant airspace disease. Heart and mediastinum are within normal limits. The trachea is midline. Atherosclerotic calcifications at the aortic arch. No large pleural effusions. Degenerative changes in thoracic spine. IMPRESSION: Subtle left basilar densities  could represent atelectasis or chronic changes. No significant airspace disease. Electronically Signed   By: Markus Daft M.D.   On: 03/10/2015 12:47    Assessment & Plan:   Diagnoses and all orders for this visit:  Dementia, with behavioral disturbance -     Basic metabolic panel; Future  Asthma, mild intermittent, uncomplicated  Need for prophylactic vaccination against Streptococcus pneumoniae (pneumococcus) -     Pneumococcal conjugate vaccine 13-valent IM  Other orders -     Discontinue: diclofenac sodium (VOLTAREN) 1 % GEL; Apply 2 g topically 4 (four) times daily. -     omeprazole (PRILOSEC) 40 MG capsule; TAKE ONE CAPSULE BY MOUTH TWICE DAILY -     diclofenac sodium (VOLTAREN) 1 % GEL; APPLY 2 GRAMS TOPICALLY 4 TIMES A DAY AS NEEDED TO BOTH KNEES -     diclofenac sodium (VOLTAREN) 1 % GEL; Apply 2 g topically 4 (four) times daily.  I have changed Mr.  [name]'s VOLTAREN to diclofenac sodium. I am also having him maintain his cholecalciferol, aspirin EC, folic acid, albuterol, loratadine, donepezil, cloNIDine, fluticasone, ipratropium, memantine, losartan, verapamil, tamsulosin, warfarin, omeprazole, and diclofenac sodium. We will continue to administer methylPREDNISolone acetate and methylPREDNISolone acetate.  Meds ordered this encounter  Medications  . DISCONTD: diclofenac sodium (VOLTAREN) 1 % GEL    Sig: Apply 2 g topically 4 (four) times daily.    Dispense:  100 g    Refill:  1    CYCLE FILL MEDICATION. Authorization is required for next refill.  Marland Kitchen omeprazole (PRILOSEC) 40 MG capsule    Sig: TAKE ONE CAPSULE BY MOUTH TWICE DAILY    Dispense:  180 capsule    Refill:  3  . diclofenac sodium (VOLTAREN) 1 % GEL    Sig: APPLY 2 GRAMS TOPICALLY 4 TIMES A DAY AS NEEDED TO BOTH KNEES    Dispense:  100 g    Refill:  1    CYCLE FILL MEDICATION. Authorization is required for next refill.  . diclofenac sodium (VOLTAREN) 1 % GEL    Sig: Apply 2 g topically 4 (four) times daily.      Dispense:  100 g    Refill:  1    CYCLE FILL MEDICATION. Authorization is required for next refill.     Follow-up: Return in about 4 months (around 11/06/2015) for a follow-up visit.  Walker Kehr, MD

## 2015-07-09 NOTE — Progress Notes (Signed)
Pre visit review using our clinic review tool, if applicable. No additional management support is needed unless otherwise documented below in the visit note. 

## 2015-07-09 NOTE — Assessment & Plan Note (Signed)
On Namenda, Aricept 

## 2015-07-15 ENCOUNTER — Ambulatory Visit (INDEPENDENT_AMBULATORY_CARE_PROVIDER_SITE_OTHER): Payer: Medicare Other | Admitting: Surgery

## 2015-07-15 DIAGNOSIS — G459 Transient cerebral ischemic attack, unspecified: Secondary | ICD-10-CM | POA: Diagnosis not present

## 2015-07-15 DIAGNOSIS — Z8679 Personal history of other diseases of the circulatory system: Secondary | ICD-10-CM

## 2015-07-15 DIAGNOSIS — I639 Cerebral infarction, unspecified: Secondary | ICD-10-CM

## 2015-07-15 DIAGNOSIS — Z5181 Encounter for therapeutic drug level monitoring: Secondary | ICD-10-CM | POA: Diagnosis not present

## 2015-07-15 DIAGNOSIS — Z8673 Personal history of transient ischemic attack (TIA), and cerebral infarction without residual deficits: Secondary | ICD-10-CM

## 2015-07-15 LAB — POCT INR: INR: 2.9

## 2015-07-26 ENCOUNTER — Telehealth: Payer: Self-pay | Admitting: Internal Medicine

## 2015-07-26 DIAGNOSIS — R296 Repeated falls: Secondary | ICD-10-CM

## 2015-07-26 DIAGNOSIS — F02818 Dementia in other diseases classified elsewhere, unspecified severity, with other behavioral disturbance: Secondary | ICD-10-CM

## 2015-07-26 DIAGNOSIS — F0281 Dementia in other diseases classified elsewhere with behavioral disturbance: Secondary | ICD-10-CM

## 2015-07-26 DIAGNOSIS — G300 Alzheimer's disease with early onset: Secondary | ICD-10-CM

## 2015-07-26 NOTE — Telephone Encounter (Signed)
Pt wife called in an wants to know if Dr Camila Li would put in a referral for home health help for this pt?    Best number [phone]

## 2015-07-27 NOTE — Telephone Encounter (Signed)
Done. Thx.

## 2015-07-28 NOTE — Telephone Encounter (Signed)
Notified pt wife md place order...Johny Chess

## 2015-07-30 ENCOUNTER — Other Ambulatory Visit: Payer: Self-pay | Admitting: Internal Medicine

## 2015-08-15 DIAGNOSIS — F0281 Dementia in other diseases classified elsewhere with behavioral disturbance: Secondary | ICD-10-CM | POA: Diagnosis not present

## 2015-08-15 DIAGNOSIS — M1991 Primary osteoarthritis, unspecified site: Secondary | ICD-10-CM | POA: Diagnosis not present

## 2015-08-15 DIAGNOSIS — G300 Alzheimer's disease with early onset: Secondary | ICD-10-CM | POA: Diagnosis not present

## 2015-08-15 DIAGNOSIS — R2681 Unsteadiness on feet: Secondary | ICD-10-CM | POA: Diagnosis not present

## 2015-08-16 ENCOUNTER — Telehealth: Payer: Self-pay | Admitting: Internal Medicine

## 2015-08-16 NOTE — Telephone Encounter (Signed)
Requesting verbal order for PT for 2 x a week for 8 weeks

## 2015-08-17 DIAGNOSIS — R2681 Unsteadiness on feet: Secondary | ICD-10-CM | POA: Diagnosis not present

## 2015-08-17 DIAGNOSIS — F0281 Dementia in other diseases classified elsewhere with behavioral disturbance: Secondary | ICD-10-CM | POA: Diagnosis not present

## 2015-08-17 DIAGNOSIS — M1991 Primary osteoarthritis, unspecified site: Secondary | ICD-10-CM | POA: Diagnosis not present

## 2015-08-17 DIAGNOSIS — G300 Alzheimer's disease with early onset: Secondary | ICD-10-CM | POA: Diagnosis not present

## 2015-08-17 NOTE — Telephone Encounter (Signed)
Verbal order given  

## 2015-08-24 ENCOUNTER — Telehealth: Payer: Self-pay | Admitting: Internal Medicine

## 2015-08-24 NOTE — Telephone Encounter (Signed)
Please advise patient is stating there could be possible UTI and would like to come in just for labs. Could we please order them?

## 2015-08-24 NOTE — Telephone Encounter (Signed)
Pt has a possible UTI and [facility] recommends he come in for a urinalysis.  Pt is requesting to come in just for labs.

## 2015-08-24 NOTE — Telephone Encounter (Signed)
OK UA and Urine cx Thx

## 2015-08-25 ENCOUNTER — Telehealth: Payer: Self-pay

## 2015-08-25 DIAGNOSIS — R2681 Unsteadiness on feet: Secondary | ICD-10-CM | POA: Diagnosis not present

## 2015-08-25 DIAGNOSIS — F0281 Dementia in other diseases classified elsewhere with behavioral disturbance: Secondary | ICD-10-CM | POA: Diagnosis not present

## 2015-08-25 DIAGNOSIS — G300 Alzheimer's disease with early onset: Secondary | ICD-10-CM | POA: Diagnosis not present

## 2015-08-25 DIAGNOSIS — R300 Dysuria: Secondary | ICD-10-CM

## 2015-08-25 DIAGNOSIS — M1991 Primary osteoarthritis, unspecified site: Secondary | ICD-10-CM | POA: Diagnosis not present

## 2015-08-25 NOTE — Addendum Note (Signed)
Addended by: Della Goo C on: 08/25/2015 03:56 PM   Modules accepted: Orders

## 2015-08-25 NOTE — Telephone Encounter (Signed)
Lab orders entered per PCP

## 2015-08-25 NOTE — Telephone Encounter (Signed)
Called patient to let them know about labs, could not reach patient, left message to call us back.

## 2015-08-26 ENCOUNTER — Ambulatory Visit (INDEPENDENT_AMBULATORY_CARE_PROVIDER_SITE_OTHER): Payer: Medicare Other | Admitting: *Deleted

## 2015-08-26 ENCOUNTER — Other Ambulatory Visit (INDEPENDENT_AMBULATORY_CARE_PROVIDER_SITE_OTHER): Payer: Medicare Other

## 2015-08-26 DIAGNOSIS — R300 Dysuria: Secondary | ICD-10-CM | POA: Diagnosis not present

## 2015-08-26 DIAGNOSIS — Z8679 Personal history of other diseases of the circulatory system: Secondary | ICD-10-CM | POA: Diagnosis not present

## 2015-08-26 DIAGNOSIS — I639 Cerebral infarction, unspecified: Secondary | ICD-10-CM

## 2015-08-26 DIAGNOSIS — Z8673 Personal history of transient ischemic attack (TIA), and cerebral infarction without residual deficits: Secondary | ICD-10-CM

## 2015-08-26 DIAGNOSIS — G459 Transient cerebral ischemic attack, unspecified: Secondary | ICD-10-CM | POA: Diagnosis not present

## 2015-08-26 DIAGNOSIS — Z5181 Encounter for therapeutic drug level monitoring: Secondary | ICD-10-CM | POA: Diagnosis not present

## 2015-08-26 LAB — URINALYSIS, ROUTINE W REFLEX MICROSCOPIC
BILIRUBIN URINE: NEGATIVE
Ketones, ur: NEGATIVE
LEUKOCYTES UA: NEGATIVE
NITRITE: NEGATIVE
Specific Gravity, Urine: 1.02 (ref 1.000–1.030)
Total Protein, Urine: 30 — AB
Urine Glucose: NEGATIVE
Urobilinogen, UA: 0.2 (ref 0.0–1.0)
WBC, UA: NONE SEEN (ref 0–?)
pH: 6.5 (ref 5.0–8.0)

## 2015-08-26 LAB — POCT INR: INR: 3.8

## 2015-08-27 DIAGNOSIS — M1991 Primary osteoarthritis, unspecified site: Secondary | ICD-10-CM | POA: Diagnosis not present

## 2015-08-27 DIAGNOSIS — R2681 Unsteadiness on feet: Secondary | ICD-10-CM | POA: Diagnosis not present

## 2015-08-27 DIAGNOSIS — G300 Alzheimer's disease with early onset: Secondary | ICD-10-CM | POA: Diagnosis not present

## 2015-08-27 DIAGNOSIS — F0281 Dementia in other diseases classified elsewhere with behavioral disturbance: Secondary | ICD-10-CM | POA: Diagnosis not present

## 2015-08-29 LAB — CULTURE, URINE COMPREHENSIVE

## 2015-08-31 DIAGNOSIS — G300 Alzheimer's disease with early onset: Secondary | ICD-10-CM | POA: Diagnosis not present

## 2015-08-31 DIAGNOSIS — F0281 Dementia in other diseases classified elsewhere with behavioral disturbance: Secondary | ICD-10-CM | POA: Diagnosis not present

## 2015-08-31 DIAGNOSIS — M1991 Primary osteoarthritis, unspecified site: Secondary | ICD-10-CM | POA: Diagnosis not present

## 2015-08-31 DIAGNOSIS — R2681 Unsteadiness on feet: Secondary | ICD-10-CM | POA: Diagnosis not present

## 2015-09-07 DIAGNOSIS — F0281 Dementia in other diseases classified elsewhere with behavioral disturbance: Secondary | ICD-10-CM | POA: Diagnosis not present

## 2015-09-07 DIAGNOSIS — M1991 Primary osteoarthritis, unspecified site: Secondary | ICD-10-CM | POA: Diagnosis not present

## 2015-09-07 DIAGNOSIS — G300 Alzheimer's disease with early onset: Secondary | ICD-10-CM | POA: Diagnosis not present

## 2015-09-07 DIAGNOSIS — R2681 Unsteadiness on feet: Secondary | ICD-10-CM | POA: Diagnosis not present

## 2015-09-09 DIAGNOSIS — R2681 Unsteadiness on feet: Secondary | ICD-10-CM | POA: Diagnosis not present

## 2015-09-09 DIAGNOSIS — M1991 Primary osteoarthritis, unspecified site: Secondary | ICD-10-CM | POA: Diagnosis not present

## 2015-09-09 DIAGNOSIS — G300 Alzheimer's disease with early onset: Secondary | ICD-10-CM | POA: Diagnosis not present

## 2015-09-09 DIAGNOSIS — F0281 Dementia in other diseases classified elsewhere with behavioral disturbance: Secondary | ICD-10-CM | POA: Diagnosis not present

## 2015-09-14 DIAGNOSIS — F0281 Dementia in other diseases classified elsewhere with behavioral disturbance: Secondary | ICD-10-CM | POA: Diagnosis not present

## 2015-09-14 DIAGNOSIS — R2681 Unsteadiness on feet: Secondary | ICD-10-CM | POA: Diagnosis not present

## 2015-09-14 DIAGNOSIS — G300 Alzheimer's disease with early onset: Secondary | ICD-10-CM | POA: Diagnosis not present

## 2015-09-14 DIAGNOSIS — J45909 Unspecified asthma, uncomplicated: Secondary | ICD-10-CM

## 2015-09-14 DIAGNOSIS — M1991 Primary osteoarthritis, unspecified site: Secondary | ICD-10-CM | POA: Diagnosis not present

## 2015-09-14 DIAGNOSIS — I10 Essential (primary) hypertension: Secondary | ICD-10-CM

## 2015-09-15 DIAGNOSIS — N400 Enlarged prostate without lower urinary tract symptoms: Secondary | ICD-10-CM | POA: Diagnosis not present

## 2015-09-15 DIAGNOSIS — J45909 Unspecified asthma, uncomplicated: Secondary | ICD-10-CM | POA: Diagnosis not present

## 2015-09-15 DIAGNOSIS — Z8673 Personal history of transient ischemic attack (TIA), and cerebral infarction without residual deficits: Secondary | ICD-10-CM | POA: Diagnosis not present

## 2015-09-15 DIAGNOSIS — Z7982 Long term (current) use of aspirin: Secondary | ICD-10-CM | POA: Diagnosis not present

## 2015-09-15 DIAGNOSIS — R2689 Other abnormalities of gait and mobility: Secondary | ICD-10-CM | POA: Diagnosis not present

## 2015-09-15 DIAGNOSIS — Z7901 Long term (current) use of anticoagulants: Secondary | ICD-10-CM | POA: Diagnosis not present

## 2015-09-15 DIAGNOSIS — I739 Peripheral vascular disease, unspecified: Secondary | ICD-10-CM | POA: Diagnosis not present

## 2015-09-15 DIAGNOSIS — I10 Essential (primary) hypertension: Secondary | ICD-10-CM | POA: Diagnosis not present

## 2015-09-15 DIAGNOSIS — G300 Alzheimer's disease with early onset: Secondary | ICD-10-CM | POA: Diagnosis not present

## 2015-09-15 DIAGNOSIS — Z87891 Personal history of nicotine dependence: Secondary | ICD-10-CM | POA: Diagnosis not present

## 2015-09-15 DIAGNOSIS — Z9181 History of falling: Secondary | ICD-10-CM | POA: Diagnosis not present

## 2015-09-15 DIAGNOSIS — M1991 Primary osteoarthritis, unspecified site: Secondary | ICD-10-CM | POA: Diagnosis not present

## 2015-09-16 ENCOUNTER — Encounter: Payer: Self-pay | Admitting: Neurology

## 2015-09-16 ENCOUNTER — Ambulatory Visit (INDEPENDENT_AMBULATORY_CARE_PROVIDER_SITE_OTHER): Payer: Medicare Other | Admitting: Neurology

## 2015-09-16 VITALS — BP 138/82 | HR 68 | Resp 16 | Ht 67.0 in | Wt 167.0 lb

## 2015-09-16 DIAGNOSIS — F0280 Dementia in other diseases classified elsewhere without behavioral disturbance: Secondary | ICD-10-CM

## 2015-09-16 DIAGNOSIS — J45909 Unspecified asthma, uncomplicated: Secondary | ICD-10-CM | POA: Diagnosis not present

## 2015-09-16 DIAGNOSIS — I10 Essential (primary) hypertension: Secondary | ICD-10-CM | POA: Diagnosis not present

## 2015-09-16 DIAGNOSIS — G309 Alzheimer's disease, unspecified: Secondary | ICD-10-CM | POA: Diagnosis not present

## 2015-09-16 DIAGNOSIS — G300 Alzheimer's disease with early onset: Secondary | ICD-10-CM | POA: Diagnosis not present

## 2015-09-16 DIAGNOSIS — Z9181 History of falling: Secondary | ICD-10-CM | POA: Diagnosis not present

## 2015-09-16 DIAGNOSIS — Z7901 Long term (current) use of anticoagulants: Secondary | ICD-10-CM | POA: Diagnosis not present

## 2015-09-16 DIAGNOSIS — I739 Peripheral vascular disease, unspecified: Secondary | ICD-10-CM | POA: Diagnosis not present

## 2015-09-16 DIAGNOSIS — Z8673 Personal history of transient ischemic attack (TIA), and cerebral infarction without residual deficits: Secondary | ICD-10-CM | POA: Diagnosis not present

## 2015-09-16 DIAGNOSIS — Z87891 Personal history of nicotine dependence: Secondary | ICD-10-CM | POA: Diagnosis not present

## 2015-09-16 DIAGNOSIS — Z7982 Long term (current) use of aspirin: Secondary | ICD-10-CM | POA: Diagnosis not present

## 2015-09-16 DIAGNOSIS — F418 Other specified anxiety disorders: Secondary | ICD-10-CM | POA: Diagnosis not present

## 2015-09-16 DIAGNOSIS — R2689 Other abnormalities of gait and mobility: Secondary | ICD-10-CM | POA: Diagnosis not present

## 2015-09-16 DIAGNOSIS — N400 Enlarged prostate without lower urinary tract symptoms: Secondary | ICD-10-CM | POA: Diagnosis not present

## 2015-09-16 DIAGNOSIS — M1991 Primary osteoarthritis, unspecified site: Secondary | ICD-10-CM | POA: Diagnosis not present

## 2015-09-16 MED ORDER — MEMANTINE HCL ER 28 MG PO CP24: 28.0000 mg | ORAL_CAPSULE | Freq: Every day | ORAL | Status: AC

## 2015-09-16 MED ORDER — DONEPEZIL HCL 10 MG PO TABS: ORAL_TABLET | ORAL | Status: AC

## 2015-09-16 MED ORDER — ESCITALOPRAM OXALATE 5 MG PO TABS: 5.0000 mg | ORAL_TABLET | Freq: Every day | ORAL | Status: AC

## 2015-09-16 NOTE — Progress Notes (Signed)
Subjective:    Patient ID: Douglas [name] is a 80 y.o. male.  HPI     Interim history:   Mr. Douglas [name] is a very pleasant 80 year old right-handed gentleman with an underlying medical history of hypertension, kidney stones, lupus anticoagulant, hyperlipidemia, left-sided stroke in 2004, vitamin D deficiency, reflux disease, prostate hypertrophy, colonic polyps, right knee pain and status post rotator cuff surgery bilaterally, who presents for followup consultation of his dementia. He is accompanied by his daughter, Douglas [name], again today. I last saw him on 03/19/2015 for a sooner than scheduled appointment secondary to more confusion reported, recent falls twice a few weeks prior and more gait instability. He reported improved knee pain since he started Synvisc injections. He had 2 injections thus far and was supposed to have a third injection soon. He was able to tolerate his memory medications. His last brain MRI without contrast on 12/12/2014 showed: This is an abnormal MRI of the brain without contrast showing prior strokes in the left occipital and right parieto-occipital lobes.   These were present on CT scan dated 10/08/2011. Additionally, there are severe chronic small vessel ischemic changes in both hemispheres.   When compared to a CT scan dated 10/08/2011, there has been some progression of the extent of cortical atrophy. There are no acute findings on the current study.  Additional note is made of chronic left maxillary and ethmoid sinusitis, similar to what was seen on the CT scan in 2013. He presented to the emergency room on 03/10/2015 with chest pain. Workup in the emergency room was negative. He had seen a cardiologist and was scheduled for stress test. His MMSE was 17/30, CDT: 1/4, AFT: 7/min at the time and I suggested we continue with his dual memory medication regimen.   Today, 09/16/2015: He reports "feeling dumb". His daughter adds that he has been more depressed and anxious  lately. The patient and his wife have recently moved into a townhouse in [place] to be closer to [place]. His wife, Douglas [name], has been back and forth the hospital visiting her critically ill mother. Things have been stressful lately. Transitioning to a new place was difficult for him. He has had more issues sleeping. They got some melatonin but didn't try it yet. He had a recent stress test. I reviewed his SPECT myocardial perfusion stress test from 03/26/2015, which showed benign findings. He has a family history of dementia. His daughter adds that he talks about end-of-life quite a bit. He is not acutely suicidal. His knee pain is still ongoing. Ultimately, his daughter believes that the injections were not successful.  Previously:  I saw him on 11/30/2014, at which time his daughter reported that he had had 2 falls within a week recently. Both times he fell in the bathroom. He did not sustain any injuries thankfully. Since then they made accommodations in their bathrooms and removed all rugs to avoid tripping. He was using a cane more consistently and had a walker available. I asked him to continue with generic Aricept and Namenda XR. His MMSE was 16 out of 30 at the time, clock drawing was 1 out of 4, animal fluency was 8/m. I ordered an MRI brain. We called his wife with the test results.  I saw him on 09/25/2014, at which time his MMSE was 16, clock drawing was 1, AFT was 8. He stated he had good and bad days. I changed his medication to Namzaric once daily.  I saw him on 03/20/14, at which time he  reported doing well overall, but his knees were bothering him. He had a steroid injection into the R knee in 4/15, which helped some. He saw Dr. Mardelle Matte. Sadly, his middle daughter (adoptive) died of lung cancer at age 54 in March 2015. On a positive note, his youngest daughter, Douglas [name], was getting married. His oldest Daughter, Douglas [name], lives in [place]. His MMSE was 20/30, CDT 1/4, AFT 11/min in July  2015. We continued Namenda long-acting at 28 mg once daily and Aricept at 10 mg once daily.  I saw him on 10/24/2013, at which time I asked him to continue with Namenda long-acting 28 mg daily and Aricept 10 mg daily.  I saw him on 04/18/2013, at which time I increased his Namenda XR to 21 mg daily. However they were not able to fill it because of shortage of the medication and he has been on Namenda XR 28 mg since November 2014, per PCP.   I first met him on 01/10/2013, at which time his MMSE was 21, clock drawing was 2, animal fluency was 12. I felt that he most likely has Alzheimer's dementia. He had some decline in his MMSE score. I asked him to start Namenda XR 7 mg once daily for one month then 14 mg once daily thereafter. I also asked him to see Dr. Valentina Shaggy for neurocognitive testing. He had that consultation in July 2014: In essence, findings were conclusive with mild Alzheimer's dementia. He did well with the addition of Namenda. He was tolerating it well and his wife was particularly pleased to see that he was thinking more clearly and was less irritable and less angry all the time. It had made a significant difference in his cognitive functioning, they both agreed and he reported no SEs.    He previously followed with Dr. Morene Antu and was last seen by him on 07/19/2012, which time Dr. Erling Cruz felt the patient had mild dementia and discussed dementia related studies. While he did not change his medication he asked the patient to take his generic Aricept during the day rather than later at night.   He has an approximately 6 year history of memory loss, particularly forgetfulness. MRI brain with and without contrast in February 2009 showed an old infarct in the left occipital lobe, mild atrophy, and moderate small vessel disease. CT head without contrast in July 2009 and March 2012 showed no other new abnormalities. Vitamin B12 level and TSH were normal in March 2012. The patient is exercising  regularly. He is independent in his daily living and drives a car. She has some depression but is not on an antidepressant. In August 2012 his MMSE was 27, clock drawing was 4, animal fluency was 14. In February 2013 his MMSE was 23, clock drawing was 4, animal fluency was 12. In July 2013 his MMSE was 23, clock drawing was 3, animal fluency was 8.    His Past Medical History Is Significant For: Past Medical History  Diagnosis Date  . GERD 03/15/2007    erosive esophagitis  . HYPERTENSION 03/15/2007  . HYPERLIPIDEMIA 10/25/2007  . OSTEOARTHRITIS 03/15/2007    right knee  . CVA (cerebral infarction) 10/27/2010  . CEREBROVASCULAR ACCIDENT, HX OF 05/17/2007  . COLONIC POLYPS, HX OF 10/25/2007  . 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 BENIGN PROSTATIC HYPERTROPHY 08/12/2007  . PERIPHERAL VASCULAR DISEASE 10/16/2007    carotids < 70%  . TRANSIENT ISCHEMIC ATTACK 03/19/2008  . Nephrolithiasis   . Vitamin D deficiency   . Acquired  deviated nasal septum   . Chronic maxillary sinusitis   . Chronic ethmoidal sinusitis   . Blood disorder   . Chronic anticoagulation   . Lupus anticoagulant disorder (HCC)     initially diagnosed in 2004 after CVA  . Hiatal hernia   . Stroke (HCC)   . Allergy   . Clotting disorder (HCC)     clotting disorder    His Past Surgical History Is Significant For: Past Surgical History  Procedure Laterality Date  . Lung biopsy  1980    s/p  . Nasal sinus surgery      s/p   . Lithotripsy      s/p  . Rotator cuff repair      x 2  . Nasal septal deviation repair    . Colonoscopy      His Family History Is Significant For: Family History  Problem Relation Age of Onset  . Alzheimer's disease Father   . Heart disease Father   . Hypertension Other   . Allergies    . Hearing loss    . Hypertension    . Rheum arthritis    . Stroke Paternal Grandmother   . Heart attack Maternal Grandfather   . Leukemia Mother   . Colon cancer Neg Hx   . Esophageal cancer  Neg Hx   . Stomach cancer Neg Hx   . Rectal cancer Neg Hx     His Social History Is Significant For: Social History   Social History  . Marital Status: Married    Spouse Name: N/A  . Number of Children: N/A  . Years of Education: N/A   Occupational History  . RETIRED [employer]   Social History Main Topics  . Smoking status: Former Smoker -- 0.50 packs/day for 20 years    Types: Cigarettes    Quit date: 08/28/1973  . Smokeless tobacco: Former Neurosurgeon    Types: Chew    Quit date: 08/28/1960  . Alcohol Use: 0.0 oz/week    0 Standard drinks or equivalent per week     Comment: social  . Drug Use: Yes    Special: Methaqualone  . Sexual Activity: Yes   Other Topics Concern  . None   Social History Narrative    His Allergies Are:  Allergies  Allergen Reactions  . Cefuroxime Axetil Other (See Comments)    bad taste  . Hydrochlorothiazide Other (See Comments)    unknown  . Iohexol Hives       . Lovastatin Other (See Comments)    cramps  . Niacin Other (See Comments)    burning  . Pseudoeph-Doxylamine-Dm-Apap Other (See Comments)    tremor  . Ramipril Cough  . Tramadol Hcl Other (See Comments)    hallucinating  . Viagra [Sildenafil Citrate]     Feverish feeling  :   His Current Medications Are:  Outpatient Encounter Prescriptions as of 09/16/2015  Medication Sig  . albuterol (PROAIR HFA) 108 (90 BASE) MCG/ACT inhaler Inhale 2 puffs into the lungs 4 (four) times daily as needed. For wheezing  . aspirin EC 81 MG tablet Take 81 mg by mouth daily.  . cholecalciferol (VITAMIN D) 1000 UNITS tablet Take 1,000 Units by mouth daily.    . cloNIDine (CATAPRES) 0.1 MG tablet Take 1 tablet (0.1 mg total) by mouth 2 (two) times daily.  . diclofenac sodium (VOLTAREN) 1 % GEL APPLY 2 GRAMS TOPICALLY 4 TIMES A DAY AS NEEDED TO BOTH KNEES  . donepezil (ARICEPT)  10 MG tablet TAKE 1 TABLET (10 MG TOTAL) BY MOUTH EVERY MORNING.  . fluticasone (FLONASE) 50 MCG/ACT nasal  spray Place 2 sprays into both nostrils daily.  . folic acid (FOLVITE) 751 MCG tablet Take 800 mcg by mouth daily.  Marland Kitchen ipratropium (ATROVENT) 0.06 % nasal spray Place 2 sprays into the nose 4 (four) times daily.  Marland Kitchen loratadine (CLARITIN) 10 MG tablet Take 1 tablet (10 mg total) by mouth daily.  Marland Kitchen losartan (COZAAR) 100 MG tablet TAKE 1 TABLET (100 MG TOTAL) BY MOUTH DAILY.  . memantine (NAMENDA XR) 28 MG CP24 24 hr capsule Take 1 capsule (28 mg total) by mouth daily.  Marland Kitchen omeprazole (PRILOSEC) 40 MG capsule TAKE ONE CAPSULE BY MOUTH TWICE DAILY  . tamsulosin (FLOMAX) 0.4 MG CAPS capsule TAKE 1 CAPSULE (0.4 MG TOTAL) BY MOUTH DAILY.  . verapamil (CALAN-SR) 180 MG CR tablet TAKE 1 TABLET (180 MG TOTAL) BY MOUTH DAILY.  . VOLTAREN 1 % GEL APPLY 2 GRAMS TOPICALLY 4 TIMES A DAY AS NEEDED TO BOTH KNEES  . warfarin (COUMADIN) 2.5 MG tablet TAKE 2 TABLETS BY MOUTH DAILY, EXCEPT 1 TABLET ON MONDAYS, WEDNESDAYS,AND FRIDAYS, OR AS DIRECTED BY COUMADIN CLINIC  . [DISCONTINUED] donepezil (ARICEPT) 10 MG tablet TAKE 1 TABLET (10 MG TOTAL) BY MOUTH EVERY MORNING.  . [DISCONTINUED] memantine (NAMENDA XR) 28 MG CP24 24 hr capsule Take 1 capsule (28 mg total) by mouth daily.  . [DISCONTINUED] diclofenac sodium (VOLTAREN) 1 % GEL Apply 2 g topically 4 (four) times daily.   Facility-Administered Encounter Medications as of 09/16/2015  Medication  . methylPREDNISolone acetate (DEPO-MEDROL) injection 80 mg  . methylPREDNISolone acetate (DEPO-MEDROL) injection 80 mg  :  Review of Systems:  Out of a complete 14 point review of systems, all are reviewed and negative with the exception of these symptoms as listed below:   Review of Systems  Neurological:       Daughter reports that patient is having some trouble with sleeping, depression and anxiety.     Objective:  Neurologic Exam  Physical Exam Physical Examination:   Filed Vitals:   09/16/15 1127  BP: 138/82  Pulse: 68  Resp: 16    General Examination:  The patient is a very pleasant 80 y.o. male in no acute distress. He is calm and cooperative with the exam. He denies Auditory Hallucinations and Visual Hallucinations. He is more talkative and upbeat today than last time.   HEENT: Normocephalic, atraumatic, pupils are equal, round and reactive to light and accommodation. Funduscopic exam is normal with sharp disc margins noted. Extraocular tracking shows mild saccadic breakdown without nystagmus noted. Hearing is intact. Face is symmetric with no facial masking and normal facial sensation. There is no lip, neck or jaw tremor. Neck is not rigid with intact passive ROM. There are no carotid bruits on auscultation. Oropharynx exam reveals mild mouth dryness. No significant airway crowding is noted. Mallampati is class II. Tongue protrudes centrally and palate elevates symmetrically.    Chest: is clear to auscultation without wheezing, rhonchi or crackles noted.  Heart: sounds are regular and normal without murmurs, rubs or gallops noted.   Abdomen: is soft, non-tender and non-distended with normal bowel sounds appreciated on auscultation.  Extremities: There is trace pitting edema in the distal lower extremities bilaterally, right more than left.   Skin: is warm and dry with no trophic changes noted.  Musculoskeletal: exam reveals no obvious joint deformities, tenderness or joint swelling or erythema, except b/l knee pain,  more on R. He has more lumbar kyphosis.   Neurologically:  Mental status: The patient is awake and alert, paying good  attention. He is able to partially provide the history. His daughter provides details. He is oriented to: person, place, situation and day of week.  His memory, attention, language and knowledge are impaired.   MMSE: 21/30, CDT: 1/4, and AFT: 8/min.  There is no aphasia, agnosia, apraxia or anomia. There is a mild degree of bradyphrenia. Speech is not hypophonic with no dysarthria noted. Mood is congruent and  affect is normal. He has more word finding difficult.   His MMSE score in May 2014 was 21/30, CDT 2/4, AFT (Animal Fluency Test) score was 12.    On 03/20/2014: MMSE: 20/30, CDT: 1/4, AFT: 11  On 09/25/2014: MMSE: 16/30, CDT: 1/4, AFT: 8, GDS: 3/15.  On 03/19/2015: MMSE: 17/30, CDT: 1/4, AFT: 7/min.   On 09/16/2015: MMSE: 11/30, CDT: 0/4, AFT: 6/min.   Cranial nerves are as described above under HEENT exam. In addition, shoulder shrug is normal with equal shoulder height noted.  Motor exam: Normal bulk, and strength for age is noted. Tone is not rigid with absence of cogwheeling. There is overall no bradykinesia. There is no drift or rebound. There is no tremor. Romberg is negative. Reflexes are 1+ in the upper extremities and 1+ in the lower extremities. Toes are downgoing bilaterally. Fine motor skills: Finger taps, hand movements, and rapid alternating patting are mildly impaired bilaterally. Foot taps and foot agility are mildly impaired bilaterally.   Cerebellar testing shows no dysmetria or intention tremor on finger to nose testing. Heel to shin is difficult for him, especially on the R. There is no truncal or gait ataxia.   Sensory exam is intact to light touch in the upper and lower extremities.   Gait, station and balance: He stands up from the seated position with no difficulty and needs no assistance. No veering to one side is noted. No leaning to one side. Posture is mild to moderately stooped in the low back. Stance is wide-based. He turns in 3 steps. Tandem walk is not possible. Balance is mild to moderately impaired. He has a single point cane and has a mild limp on the R.   Assessment and Plan:   In summary, AKBAR SACRA is a very pleasant 80 year old male with an underlying medical history of hypertension, kidney stones, lupus anticoagulant (on coumadin), hyperlipidemia, left-sided stroke in 2004, vitamin D deficiency, reflux disease, prostate hypertrophy, colonic polyps,  b/l knee pain and status post rotator cuff surgery bilaterally, who presents for followup consultation of his Alzheimer's dementia, on dual dementia medications with generic Aricept at 10 mg daily and Namenda long-acting at 28 mg daily. He has been able to tolerate medications. However, in the past year, memory scores have declined. He is indeed more depressed appearing, he has also lost some weight, he appears more anxious and has had more confusion and sometimes disorientation even around his living situation. He has had a recent move and transitioning to new living place is always difficult. I talked to the patient and his daughter at length today. We will continue with his memory medications at the same doses. I would like to suggest a trial of low-dose antidepressant in the form of generic Lexapro 5 mg once daily. I provided a new prescription and written instructions and we talked about potential side effects including rare side effects such as suicidal ideation and worsening depression. He is  advised to stay active physically as best as possible but use his cane at all times for safety. He is advised to eat right and drink plenty of water. His wife has had more stress. He has a family history of dementia and worries about it. He had a brain MRI on 12/12/2014 which showed no acute findings, prior strokes, more advanced small vessel disease and more advanced cortical atrophy when compared to a prior head CT. He had neuropsychological testing in July 2014 with findings in keeping with mild Alzheimer's dementia at the time. While we can consider repeating this, it may not change our management.  I will see him back routinely in a few months. I asked his daughter to give [place] a call if she has any new concerns or if he needs to be seen sooner than scheduled. We will certainly be happy to accommodate. I renewed his memory medication prescriptions and provided a new prescription for Lexapro generic 5 mg once daily. I  answered all their questions today and the patient and his daughter were in agreement. I spent 25 in total face-to-face time with the patient, more 50% of which was spent in counseling and coordination of care, reviewing test results, reviewing medication and reviewing the diagnosis of AD, its prognosis and treatment options.

## 2015-09-16 NOTE — Patient Instructions (Addendum)
We will continue with both your memory medications.  Try to stay active mentally and physically, use your cane for safety.  You can try Melatonin at night for sleep: take 3 to 5 mg one hour before your bedtime.   For your depression and anxiety, we will try you on low dose Lexapro generic, 5 mg: take 1 pill daily. Side effects reported are: mouth dryness, drowsiness, confusion, dizziness, rarely suicidal thoughts and worsening depression.

## 2015-09-17 ENCOUNTER — Ambulatory Visit: Payer: Medicare Other | Admitting: Neurology

## 2015-09-21 ENCOUNTER — Other Ambulatory Visit (INDEPENDENT_AMBULATORY_CARE_PROVIDER_SITE_OTHER): Payer: Medicare Other

## 2015-09-21 ENCOUNTER — Encounter: Payer: Self-pay | Admitting: Internal Medicine

## 2015-09-21 ENCOUNTER — Ambulatory Visit (INDEPENDENT_AMBULATORY_CARE_PROVIDER_SITE_OTHER): Payer: Medicare Other | Admitting: Internal Medicine

## 2015-09-21 VITALS — BP 130/64 | HR 57 | Temp 98.1°F | Wt 163.0 lb

## 2015-09-21 DIAGNOSIS — G8929 Other chronic pain: Secondary | ICD-10-CM | POA: Insufficient documentation

## 2015-09-21 DIAGNOSIS — R1013 Epigastric pain: Secondary | ICD-10-CM

## 2015-09-21 LAB — CBC WITH DIFFERENTIAL/PLATELET
BASOS PCT: 0.5 % (ref 0.0–3.0)
Basophils Absolute: 0 10*3/uL (ref 0.0–0.1)
EOS PCT: 2.6 % (ref 0.0–5.0)
Eosinophils Absolute: 0.2 10*3/uL (ref 0.0–0.7)
HCT: 41.2 % (ref 39.0–52.0)
HEMOGLOBIN: 13.7 g/dL (ref 13.0–17.0)
Lymphocytes Relative: 22.1 % (ref 12.0–46.0)
Lymphs Abs: 1.5 10*3/uL (ref 0.7–4.0)
MCHC: 33.2 g/dL (ref 30.0–36.0)
MCV: 94 fl (ref 78.0–100.0)
MONO ABS: 0.5 10*3/uL (ref 0.1–1.0)
MONOS PCT: 7.2 % (ref 3.0–12.0)
Neutro Abs: 4.5 10*3/uL (ref 1.4–7.7)
Neutrophils Relative %: 67.6 % (ref 43.0–77.0)
Platelets: 146 10*3/uL — ABNORMAL LOW (ref 150.0–400.0)
RBC: 4.38 Mil/uL (ref 4.22–5.81)
RDW: 14 % (ref 11.5–15.5)
WBC: 6.6 10*3/uL (ref 4.0–10.5)

## 2015-09-21 LAB — HEPATIC FUNCTION PANEL
ALBUMIN: 3.8 g/dL (ref 3.5–5.2)
ALK PHOS: 62 U/L (ref 39–117)
ALT: 13 U/L (ref 0–53)
AST: 16 U/L (ref 0–37)
Bilirubin, Direct: 0.1 mg/dL (ref 0.0–0.3)
TOTAL PROTEIN: 6.4 g/dL (ref 6.0–8.3)
Total Bilirubin: 0.6 mg/dL (ref 0.2–1.2)

## 2015-09-21 LAB — BASIC METABOLIC PANEL
BUN: 19 mg/dL (ref 6–23)
CALCIUM: 9 mg/dL (ref 8.4–10.5)
CO2: 30 mEq/L (ref 19–32)
CREATININE: 1.16 mg/dL (ref 0.40–1.50)
Chloride: 108 mEq/L (ref 96–112)
GFR: 64.86 mL/min (ref >60.00)
Glucose, Bld: 74 mg/dL (ref 70–99)
Potassium: 4.4 mEq/L (ref 3.5–5.1)
Sodium: 143 mEq/L (ref 135–145)

## 2015-09-21 LAB — SEDIMENTATION RATE: Sed Rate: 16 mm/hr (ref 0–22)

## 2015-09-21 NOTE — Progress Notes (Signed)
Subjective:  Patient ID: Douglas [name], male    DOB: [birth year]  Age: 80 y.o. MRN: [id number]  CC: No chief complaint on file.   HPI Douglas [name] presents for R abd pain x 1 mo  Outpatient Prescriptions Prior to Visit  Medication Sig Dispense Refill  . albuterol (PROAIR HFA) 108 (90 BASE) MCG/ACT inhaler Inhale 2 puffs into the lungs 4 (four) times daily as needed. For wheezing 8.5 g 3  . aspirin EC 81 MG tablet Take 81 mg by mouth daily.    . cholecalciferol (VITAMIN D) 1000 UNITS tablet Take 1,000 Units by mouth daily.      . cloNIDine (CATAPRES) 0.1 MG tablet Take 1 tablet (0.1 mg total) by mouth 2 (two) times daily. 60 tablet 11  . diclofenac sodium (VOLTAREN) 1 % GEL APPLY 2 GRAMS TOPICALLY 4 TIMES A DAY AS NEEDED TO BOTH KNEES 100 g 1  . donepezil (ARICEPT) 10 MG tablet TAKE 1 TABLET (10 MG TOTAL) BY MOUTH EVERY MORNING. 90 tablet 3  . escitalopram (LEXAPRO) 5 MG tablet Take 1 tablet (5 mg total) by mouth daily. 30 tablet 5  . fluticasone (FLONASE) 50 MCG/ACT nasal spray Place 2 sprays into both nostrils daily. 16 g 11  . folic acid (FOLVITE) Q000111Q MCG tablet Take 800 mcg by mouth daily.    Marland Kitchen ipratropium (ATROVENT) 0.06 % nasal spray Place 2 sprays into the nose 4 (four) times daily. 15 mL 11  . loratadine (CLARITIN) 10 MG tablet Take 1 tablet (10 mg total) by mouth daily. 100 tablet 3  . losartan (COZAAR) 100 MG tablet TAKE 1 TABLET (100 MG TOTAL) BY MOUTH DAILY. 30 tablet 5  . memantine (NAMENDA XR) 28 MG CP24 24 hr capsule Take 1 capsule (28 mg total) by mouth daily. 90 capsule 3  . omeprazole (PRILOSEC) 40 MG capsule TAKE ONE CAPSULE BY MOUTH TWICE DAILY 180 capsule 3  . tamsulosin (FLOMAX) 0.4 MG CAPS capsule TAKE 1 CAPSULE (0.4 MG TOTAL) BY MOUTH DAILY. 90 capsule 3  . verapamil (CALAN-SR) 180 MG CR tablet TAKE 1 TABLET (180 MG TOTAL) BY MOUTH DAILY. 30 tablet 5  . VOLTAREN 1 % GEL APPLY 2 GRAMS TOPICALLY 4 TIMES A DAY AS NEEDED TO BOTH KNEES 300 g 0  . warfarin  (COUMADIN) 2.5 MG tablet TAKE 2 TABLETS BY MOUTH DAILY, EXCEPT 1 TABLET ON MONDAYS, WEDNESDAYS,AND FRIDAYS, OR AS DIRECTED BY COUMADIN CLINIC 120 tablet 0   Facility-Administered Medications Prior to Visit  Medication Dose Route Frequency Provider Last Rate Last Dose  . methylPREDNISolone acetate (DEPO-MEDROL) injection 80 mg  80 mg Intra-articular Once Chrishana Spargur V, MD      . methylPREDNISolone acetate (DEPO-MEDROL) injection 80 mg  80 mg Intra-articular Once Lashone Stauber V, MD        ROS Review of Systems  Constitutional: Negative for appetite change, fatigue and unexpected weight change.  HENT: Negative for congestion, nosebleeds, sneezing, sore throat and trouble swallowing.   Eyes: Negative for itching and visual disturbance.  Respiratory: Negative for cough.   Cardiovascular: Negative for chest pain, palpitations and leg swelling.  Gastrointestinal: Positive for abdominal pain. Negative for nausea, diarrhea, blood in stool and abdominal distention.  Genitourinary: Negative for frequency, hematuria and decreased urine volume.  Musculoskeletal: Positive for arthralgias and gait problem. Negative for back pain, joint swelling and neck pain.  Skin: Negative for rash.  Neurological: Negative for dizziness, tremors, speech difficulty and weakness.  Psychiatric/Behavioral: Positive for behavioral problems,  confusion and decreased concentration. Negative for sleep disturbance, dysphoric mood and agitation. The patient is not nervous/anxious.     Objective:  BP 130/64 mmHg  Pulse 57  Temp(Src) 98.1 F (36.7 C) (Oral)  Wt 163 lb (73.936 kg)  SpO2 97%  BP Readings from Last 3 Encounters:  09/21/15 130/64  09/16/15 138/82  07/09/15 130/70    Wt Readings from Last 3 Encounters:  09/21/15 163 lb (73.936 kg)  09/16/15 167 lb (75.751 kg)  07/09/15 169 lb (76.658 kg)    Physical Exam  Constitutional: He is oriented to person, place, and time. He appears well-developed. No  distress.  NAD  HENT:  Mouth/Throat: Oropharynx is clear and moist.  Eyes: Conjunctivae are normal. Pupils are equal, round, and reactive to light.  Neck: Normal range of motion. No JVD present. No thyromegaly present.  Cardiovascular: Normal rate, regular rhythm, normal heart sounds and intact distal pulses.  Exam reveals no gallop and no friction rub.   No murmur heard. Pulmonary/Chest: Effort normal and breath sounds normal. No respiratory distress. He has no wheezes. He has no rales. He exhibits no tenderness.  Abdominal: Soft. Bowel sounds are normal. He exhibits no distension and no mass. There is no tenderness. There is no rebound and no guarding.  Musculoskeletal: Normal range of motion. He exhibits no edema or tenderness.  Lymphadenopathy:    He has no cervical adenopathy.  Neurological: He is alert and oriented to person, place, and time. He has normal reflexes. No cranial nerve deficit. He exhibits normal muscle tone. He displays a negative Romberg sign. Coordination abnormal. Gait normal.  Skin: Skin is warm and dry. No rash noted.  Psychiatric: He has a normal mood and affect.  cane  Lab Results  Component Value Date   WBC 6.6 09/21/2015   HGB 13.7 09/21/2015   HCT 41.2 09/21/2015   PLT 146.0* 09/21/2015   GLUCOSE 74 09/21/2015   CHOL 168 04/18/2013   TRIG 197.0* 04/18/2013   HDL 34.00* 04/18/2013   LDLDIRECT 137.9 05/11/2010   LDLCALC 95 04/18/2013   ALT 13 09/21/2015   AST 16 09/21/2015   NA 143 09/21/2015   K 4.4 09/21/2015   CL 108 09/21/2015   CREATININE 1.16 09/21/2015   BUN 19 09/21/2015   CO2 30 09/21/2015   TSH 2.11 11/13/2014   PSA 1.61 10/18/2012   INR 3.2 09/23/2015   HGBA1C 5.8 11/13/2014    Dg Chest 2 View  03/10/2015  CLINICAL DATA:  Chest pain.  Question confusion. EXAM: CHEST  2 VIEW COMPARISON:  10/08/2011 FINDINGS: Subtle densities in the left lower lung are nonspecific but could represent chronic changes or atelectasis. No significant  airspace disease. Heart and mediastinum are within normal limits. The trachea is midline. Atherosclerotic calcifications at the aortic arch. No large pleural effusions. Degenerative changes in thoracic spine. IMPRESSION: Subtle left basilar densities could represent atelectasis or chronic changes. No significant airspace disease. Electronically Signed   By: Markus Daft M.D.   On: 03/10/2015 12:47    Assessment & Plan:   Diagnoses and all orders for this visit:  Abdominal pain, chronic, epigastric -     CBC with Differential/Platelet; Future -     Urinalysis; Future -     Sedimentation rate; Future -     Hepatic function panel; Future -     Basic metabolic panel; Future  I am having Douglas [name] maintain his cholecalciferol, aspirin EC, folic acid, albuterol, loratadine, cloNIDine, fluticasone, ipratropium, losartan, verapamil, tamsulosin,  warfarin, omeprazole, diclofenac sodium, VOLTAREN, donepezil, memantine, and escitalopram. We will continue to administer methylPREDNISolone acetate and methylPREDNISolone acetate.  No orders of the defined types were placed in this encounter.     Follow-up: Return in about 3 months (around 12/20/2015) for a follow-up visit.  Walker Kehr, MD

## 2015-09-21 NOTE — Progress Notes (Signed)
Pre visit review using our clinic review tool, if applicable. No additional management support is needed unless otherwise documented below in the visit note. 

## 2015-09-21 NOTE — Assessment & Plan Note (Signed)
1/17 chronic, vague Labs Abd CT if not better

## 2015-09-23 ENCOUNTER — Ambulatory Visit (INDEPENDENT_AMBULATORY_CARE_PROVIDER_SITE_OTHER): Payer: Medicare Other | Admitting: *Deleted

## 2015-09-23 DIAGNOSIS — Z8679 Personal history of other diseases of the circulatory system: Secondary | ICD-10-CM | POA: Diagnosis not present

## 2015-09-23 DIAGNOSIS — Z5181 Encounter for therapeutic drug level monitoring: Secondary | ICD-10-CM | POA: Diagnosis not present

## 2015-09-23 DIAGNOSIS — G459 Transient cerebral ischemic attack, unspecified: Secondary | ICD-10-CM

## 2015-09-23 DIAGNOSIS — I639 Cerebral infarction, unspecified: Secondary | ICD-10-CM

## 2015-09-23 DIAGNOSIS — Z8673 Personal history of transient ischemic attack (TIA), and cerebral infarction without residual deficits: Secondary | ICD-10-CM

## 2015-09-23 LAB — URINALYSIS, ROUTINE W REFLEX MICROSCOPIC
Bilirubin Urine: NEGATIVE
HGB URINE DIPSTICK: NEGATIVE
Ketones, ur: NEGATIVE
Leukocytes, UA: NEGATIVE
Nitrite: NEGATIVE
PH: 6.5 (ref 5.0–8.0)
RBC / HPF: NONE SEEN (ref 0–?)
Specific Gravity, Urine: 1.02 (ref 1.000–1.030)
TOTAL PROTEIN, URINE-UPE24: 30 — AB
Urine Glucose: NEGATIVE
Urobilinogen, UA: 1 (ref 0.0–1.0)

## 2015-09-23 LAB — POCT INR: INR: 3.2

## 2015-09-29 DIAGNOSIS — M1991 Primary osteoarthritis, unspecified site: Secondary | ICD-10-CM | POA: Diagnosis not present

## 2015-09-29 DIAGNOSIS — I739 Peripheral vascular disease, unspecified: Secondary | ICD-10-CM | POA: Diagnosis not present

## 2015-09-29 DIAGNOSIS — J45909 Unspecified asthma, uncomplicated: Secondary | ICD-10-CM | POA: Diagnosis not present

## 2015-09-29 DIAGNOSIS — Z7982 Long term (current) use of aspirin: Secondary | ICD-10-CM | POA: Diagnosis not present

## 2015-09-29 DIAGNOSIS — Z87891 Personal history of nicotine dependence: Secondary | ICD-10-CM | POA: Diagnosis not present

## 2015-09-29 DIAGNOSIS — G300 Alzheimer's disease with early onset: Secondary | ICD-10-CM | POA: Diagnosis not present

## 2015-09-29 DIAGNOSIS — Z8673 Personal history of transient ischemic attack (TIA), and cerebral infarction without residual deficits: Secondary | ICD-10-CM | POA: Diagnosis not present

## 2015-09-29 DIAGNOSIS — Z9181 History of falling: Secondary | ICD-10-CM | POA: Diagnosis not present

## 2015-09-29 DIAGNOSIS — N400 Enlarged prostate without lower urinary tract symptoms: Secondary | ICD-10-CM | POA: Diagnosis not present

## 2015-09-29 DIAGNOSIS — R2689 Other abnormalities of gait and mobility: Secondary | ICD-10-CM | POA: Diagnosis not present

## 2015-09-29 DIAGNOSIS — I10 Essential (primary) hypertension: Secondary | ICD-10-CM | POA: Diagnosis not present

## 2015-09-29 DIAGNOSIS — Z7901 Long term (current) use of anticoagulants: Secondary | ICD-10-CM | POA: Diagnosis not present

## 2015-09-30 ENCOUNTER — Telehealth: Payer: Self-pay

## 2015-09-30 DIAGNOSIS — N400 Enlarged prostate without lower urinary tract symptoms: Secondary | ICD-10-CM | POA: Diagnosis not present

## 2015-09-30 DIAGNOSIS — J45909 Unspecified asthma, uncomplicated: Secondary | ICD-10-CM

## 2015-09-30 DIAGNOSIS — Z8673 Personal history of transient ischemic attack (TIA), and cerebral infarction without residual deficits: Secondary | ICD-10-CM | POA: Diagnosis not present

## 2015-09-30 DIAGNOSIS — F0281 Dementia in other diseases classified elsewhere with behavioral disturbance: Secondary | ICD-10-CM | POA: Diagnosis not present

## 2015-09-30 DIAGNOSIS — Z7982 Long term (current) use of aspirin: Secondary | ICD-10-CM | POA: Diagnosis not present

## 2015-09-30 DIAGNOSIS — Z87891 Personal history of nicotine dependence: Secondary | ICD-10-CM | POA: Diagnosis not present

## 2015-09-30 DIAGNOSIS — Z9181 History of falling: Secondary | ICD-10-CM | POA: Diagnosis not present

## 2015-09-30 DIAGNOSIS — I739 Peripheral vascular disease, unspecified: Secondary | ICD-10-CM | POA: Diagnosis not present

## 2015-09-30 DIAGNOSIS — Z7901 Long term (current) use of anticoagulants: Secondary | ICD-10-CM | POA: Diagnosis not present

## 2015-09-30 DIAGNOSIS — M1991 Primary osteoarthritis, unspecified site: Secondary | ICD-10-CM | POA: Diagnosis not present

## 2015-09-30 DIAGNOSIS — I10 Essential (primary) hypertension: Secondary | ICD-10-CM

## 2015-09-30 DIAGNOSIS — R2689 Other abnormalities of gait and mobility: Secondary | ICD-10-CM | POA: Diagnosis not present

## 2015-09-30 DIAGNOSIS — R2681 Unsteadiness on feet: Secondary | ICD-10-CM | POA: Diagnosis not present

## 2015-09-30 DIAGNOSIS — G300 Alzheimer's disease with early onset: Secondary | ICD-10-CM | POA: Diagnosis not present

## 2015-09-30 NOTE — Telephone Encounter (Signed)
Home Health Cert/Plan of Care received completed from MD. Faxed, copy sent to scan

## 2015-10-03 ENCOUNTER — Other Ambulatory Visit: Payer: Self-pay | Admitting: Cardiovascular Disease

## 2015-10-05 DIAGNOSIS — R2689 Other abnormalities of gait and mobility: Secondary | ICD-10-CM | POA: Diagnosis not present

## 2015-10-05 DIAGNOSIS — J45909 Unspecified asthma, uncomplicated: Secondary | ICD-10-CM | POA: Diagnosis not present

## 2015-10-05 DIAGNOSIS — G300 Alzheimer's disease with early onset: Secondary | ICD-10-CM | POA: Diagnosis not present

## 2015-10-05 DIAGNOSIS — M1991 Primary osteoarthritis, unspecified site: Secondary | ICD-10-CM | POA: Diagnosis not present

## 2015-10-05 DIAGNOSIS — Z8673 Personal history of transient ischemic attack (TIA), and cerebral infarction without residual deficits: Secondary | ICD-10-CM | POA: Diagnosis not present

## 2015-10-05 DIAGNOSIS — Z87891 Personal history of nicotine dependence: Secondary | ICD-10-CM | POA: Diagnosis not present

## 2015-10-05 DIAGNOSIS — Z7901 Long term (current) use of anticoagulants: Secondary | ICD-10-CM | POA: Diagnosis not present

## 2015-10-05 DIAGNOSIS — N400 Enlarged prostate without lower urinary tract symptoms: Secondary | ICD-10-CM | POA: Diagnosis not present

## 2015-10-05 DIAGNOSIS — I10 Essential (primary) hypertension: Secondary | ICD-10-CM | POA: Diagnosis not present

## 2015-10-05 DIAGNOSIS — Z9181 History of falling: Secondary | ICD-10-CM | POA: Diagnosis not present

## 2015-10-05 DIAGNOSIS — I739 Peripheral vascular disease, unspecified: Secondary | ICD-10-CM | POA: Diagnosis not present

## 2015-10-05 DIAGNOSIS — Z7982 Long term (current) use of aspirin: Secondary | ICD-10-CM | POA: Diagnosis not present

## 2015-10-07 DIAGNOSIS — Z7982 Long term (current) use of aspirin: Secondary | ICD-10-CM | POA: Diagnosis not present

## 2015-10-07 DIAGNOSIS — M1991 Primary osteoarthritis, unspecified site: Secondary | ICD-10-CM | POA: Diagnosis not present

## 2015-10-07 DIAGNOSIS — Z8673 Personal history of transient ischemic attack (TIA), and cerebral infarction without residual deficits: Secondary | ICD-10-CM | POA: Diagnosis not present

## 2015-10-07 DIAGNOSIS — I10 Essential (primary) hypertension: Secondary | ICD-10-CM | POA: Diagnosis not present

## 2015-10-07 DIAGNOSIS — Z7901 Long term (current) use of anticoagulants: Secondary | ICD-10-CM | POA: Diagnosis not present

## 2015-10-07 DIAGNOSIS — G300 Alzheimer's disease with early onset: Secondary | ICD-10-CM | POA: Diagnosis not present

## 2015-10-07 DIAGNOSIS — R2689 Other abnormalities of gait and mobility: Secondary | ICD-10-CM | POA: Diagnosis not present

## 2015-10-07 DIAGNOSIS — J45909 Unspecified asthma, uncomplicated: Secondary | ICD-10-CM | POA: Diagnosis not present

## 2015-10-07 DIAGNOSIS — Z9181 History of falling: Secondary | ICD-10-CM | POA: Diagnosis not present

## 2015-10-07 DIAGNOSIS — I739 Peripheral vascular disease, unspecified: Secondary | ICD-10-CM | POA: Diagnosis not present

## 2015-10-07 DIAGNOSIS — Z87891 Personal history of nicotine dependence: Secondary | ICD-10-CM | POA: Diagnosis not present

## 2015-10-07 DIAGNOSIS — N400 Enlarged prostate without lower urinary tract symptoms: Secondary | ICD-10-CM | POA: Diagnosis not present

## 2015-10-12 ENCOUNTER — Ambulatory Visit (INDEPENDENT_AMBULATORY_CARE_PROVIDER_SITE_OTHER): Payer: Medicare Other | Admitting: *Deleted

## 2015-10-12 ENCOUNTER — Telehealth: Payer: Self-pay | Admitting: *Deleted

## 2015-10-12 DIAGNOSIS — G459 Transient cerebral ischemic attack, unspecified: Secondary | ICD-10-CM

## 2015-10-12 DIAGNOSIS — Z8679 Personal history of other diseases of the circulatory system: Secondary | ICD-10-CM | POA: Diagnosis not present

## 2015-10-12 DIAGNOSIS — Z8673 Personal history of transient ischemic attack (TIA), and cerebral infarction without residual deficits: Secondary | ICD-10-CM

## 2015-10-12 DIAGNOSIS — I639 Cerebral infarction, unspecified: Secondary | ICD-10-CM

## 2015-10-12 DIAGNOSIS — Z5181 Encounter for therapeutic drug level monitoring: Secondary | ICD-10-CM | POA: Diagnosis not present

## 2015-10-12 LAB — POCT INR: INR: 2

## 2015-10-12 NOTE — Telephone Encounter (Signed)
Douglas [name], PT with [facility] is requesting verbal orders for PT 2 x wk for 9 weeks and Speech therapy. Verbal orders given.

## 2015-10-14 NOTE — Progress Notes (Signed)
Patient ID: Douglas [name], male   DOB: 1936/04/02, 80 y.o.   MRN: [id number]   Douglas [name] is seen back today for a follow up visit.  He has a history of a hypercoagulable state with a positive lupus anticoagulant and is on chronic coumadin. He has had prior CVA with resultant vascular dementia  Dementia is progressive Not driving wife much younger than him  Likes to talk about [employer]   CRF: HTN and elevated lipids on Rx   03/26/15  Had normal myovue done for chest pain   The left ventricular ejection fraction is normal (55-65%).  Nuclear stress EF: 58%.  There was no ST segment deviation noted during stress.  The study is normal.  This is a low risk study.  No evidence of ischemia or scar.  Namenda helping with anger issues but no memory.  Weight going down but good appetite No chest pain   Current Outpatient Prescriptions on File Prior to Visit  Medication Sig Dispense Refill  . albuterol (PROAIR HFA) 108 (90 BASE) MCG/ACT inhaler Inhale 2 puffs into the lungs 4 (four) times daily as needed. For wheezing 8.5 g 3  . aspirin EC 81 MG tablet Take 81 mg by mouth daily.    . cholecalciferol (VITAMIN D) 1000 UNITS tablet Take 1,000 Units by mouth daily.      . cloNIDine (CATAPRES) 0.1 MG tablet TAKE 1 TABLET (0.1 MG TOTAL) BY MOUTH 2 (TWO) TIMES DAILY. 60 tablet 0  . diclofenac sodium (VOLTAREN) 1 % GEL APPLY 2 GRAMS TOPICALLY 4 TIMES A DAY AS NEEDED TO BOTH KNEES 100 g 1  . donepezil (ARICEPT) 10 MG tablet TAKE 1 TABLET (10 MG TOTAL) BY MOUTH EVERY MORNING. 90 tablet 3  . escitalopram (LEXAPRO) 5 MG tablet Take 1 tablet (5 mg total) by mouth daily. 30 tablet 5  . fluticasone (FLONASE) 50 MCG/ACT nasal spray Place 2 sprays into both nostrils daily. 16 g 11  . folic acid (FOLVITE) Q000111Q MCG tablet Take 800 mcg by mouth daily.    Marland Kitchen ipratropium (ATROVENT) 0.06 % nasal spray Place 2 sprays into the nose 4 (four) times daily. 15 mL 11  . loratadine (CLARITIN) 10 MG tablet Take  1 tablet (10 mg total) by mouth daily. 100 tablet 3  . losartan (COZAAR) 100 MG tablet TAKE 1 TABLET (100 MG TOTAL) BY MOUTH DAILY. 30 tablet 5  . memantine (NAMENDA XR) 28 MG CP24 24 hr capsule Take 1 capsule (28 mg total) by mouth daily. 90 capsule 3  . omeprazole (PRILOSEC) 40 MG capsule TAKE ONE CAPSULE BY MOUTH TWICE DAILY 180 capsule 3  . tamsulosin (FLOMAX) 0.4 MG CAPS capsule TAKE 1 CAPSULE (0.4 MG TOTAL) BY MOUTH DAILY. 90 capsule 3  . verapamil (CALAN-SR) 180 MG CR tablet TAKE 1 TABLET (180 MG TOTAL) BY MOUTH DAILY. 30 tablet 5  . warfarin (COUMADIN) 2.5 MG tablet TAKE 2 TABLETS BY MOUTH DAILY, EXCEPT 1 TABLET ON MONDAYS, WEDNESDAYS,AND FRIDAYS, OR AS DIRECTED BY COUMADIN CLINIC 120 tablet 1   Current Facility-Administered Medications on File Prior to Visit  Medication Dose Route Frequency Provider Last Rate Last Dose  . methylPREDNISolone acetate (DEPO-MEDROL) injection 80 mg  80 mg Intra-articular Once Aleksei Plotnikov V, MD      . methylPREDNISolone acetate (DEPO-MEDROL) injection 80 mg  80 mg Intra-articular Once Aleksei Plotnikov V, MD        ROS: Denies fever, malais, weight loss, blurry vision, decreased visual acuity, cough, sputum, SOB,  hemoptysis, pleuritic pain, palpitaitons, heartburn, abdominal pain, melena, lower extremity edema, claudication, or rash.  All other systems reviewed and negative  General: Affect appropriate Healthy:  appears stated age 39: normal Neck supple with no adenopathy JVP normal no bruits no thyromegaly Lungs clear with no wheezing and good diaphragmatic motion Heart:  S1/S2 no murmur, no rub, gallop or click PMI normal Abdomen: benighn, BS positve, no tenderness, no AAA no bruit.  No HSM or HJR Distal pulses intact with no bruits No edema Neuro non-focal Skin warm and dry No muscular weakness   Allergies  Cefuroxime axetil; Hydrochlorothiazide; Iohexol; Lovastatin; Niacin; Pseudoeph-doxylamine-dm-apap; Ramipril; Tramadol hcl; and  Viagra  Electrocardiogram:  January 2015  SR rate 68 normal   10/09/14  SR rate 73  Normal with artifact  Assessment and Plan Hypercoagulable: on coumadin no recurrent clotting events Dementia: previous CVA on namenda f/u neurology HTN: Well controlled.  Continue current medications and low sodium Dash type diet.   Chol:  Cholesterol is at goal.  Continue current dose of statin and diet Rx.  No myalgias or side effects.  F/U  LFT's in 6 months. Lab Results  Component Value Date   LDLCALC 95 04/18/2013            Chest pain: resolved normal myovue 03/26/15  ECG normal Observe    Jenkins Rouge

## 2015-10-15 DIAGNOSIS — I739 Peripheral vascular disease, unspecified: Secondary | ICD-10-CM | POA: Diagnosis not present

## 2015-10-15 DIAGNOSIS — I10 Essential (primary) hypertension: Secondary | ICD-10-CM | POA: Diagnosis not present

## 2015-10-15 DIAGNOSIS — G300 Alzheimer's disease with early onset: Secondary | ICD-10-CM | POA: Diagnosis not present

## 2015-10-15 DIAGNOSIS — J45909 Unspecified asthma, uncomplicated: Secondary | ICD-10-CM | POA: Diagnosis not present

## 2015-10-15 DIAGNOSIS — Z87891 Personal history of nicotine dependence: Secondary | ICD-10-CM | POA: Diagnosis not present

## 2015-10-15 DIAGNOSIS — Z7901 Long term (current) use of anticoagulants: Secondary | ICD-10-CM | POA: Diagnosis not present

## 2015-10-15 DIAGNOSIS — Z9181 History of falling: Secondary | ICD-10-CM | POA: Diagnosis not present

## 2015-10-15 DIAGNOSIS — Z7982 Long term (current) use of aspirin: Secondary | ICD-10-CM | POA: Diagnosis not present

## 2015-10-15 DIAGNOSIS — M1991 Primary osteoarthritis, unspecified site: Secondary | ICD-10-CM | POA: Diagnosis not present

## 2015-10-15 DIAGNOSIS — R2689 Other abnormalities of gait and mobility: Secondary | ICD-10-CM | POA: Diagnosis not present

## 2015-10-15 DIAGNOSIS — Z8673 Personal history of transient ischemic attack (TIA), and cerebral infarction without residual deficits: Secondary | ICD-10-CM | POA: Diagnosis not present

## 2015-10-15 DIAGNOSIS — N400 Enlarged prostate without lower urinary tract symptoms: Secondary | ICD-10-CM | POA: Diagnosis not present

## 2015-10-16 DIAGNOSIS — J45909 Unspecified asthma, uncomplicated: Secondary | ICD-10-CM | POA: Diagnosis not present

## 2015-10-16 DIAGNOSIS — Z7901 Long term (current) use of anticoagulants: Secondary | ICD-10-CM | POA: Diagnosis not present

## 2015-10-16 DIAGNOSIS — Z87891 Personal history of nicotine dependence: Secondary | ICD-10-CM | POA: Diagnosis not present

## 2015-10-16 DIAGNOSIS — I10 Essential (primary) hypertension: Secondary | ICD-10-CM | POA: Diagnosis not present

## 2015-10-16 DIAGNOSIS — G300 Alzheimer's disease with early onset: Secondary | ICD-10-CM | POA: Diagnosis not present

## 2015-10-16 DIAGNOSIS — Z9181 History of falling: Secondary | ICD-10-CM | POA: Diagnosis not present

## 2015-10-16 DIAGNOSIS — N400 Enlarged prostate without lower urinary tract symptoms: Secondary | ICD-10-CM | POA: Diagnosis not present

## 2015-10-16 DIAGNOSIS — Z7982 Long term (current) use of aspirin: Secondary | ICD-10-CM | POA: Diagnosis not present

## 2015-10-16 DIAGNOSIS — R2689 Other abnormalities of gait and mobility: Secondary | ICD-10-CM | POA: Diagnosis not present

## 2015-10-16 DIAGNOSIS — M1991 Primary osteoarthritis, unspecified site: Secondary | ICD-10-CM | POA: Diagnosis not present

## 2015-10-16 DIAGNOSIS — Z8673 Personal history of transient ischemic attack (TIA), and cerebral infarction without residual deficits: Secondary | ICD-10-CM | POA: Diagnosis not present

## 2015-10-16 DIAGNOSIS — I739 Peripheral vascular disease, unspecified: Secondary | ICD-10-CM | POA: Diagnosis not present

## 2015-10-19 DIAGNOSIS — Z8673 Personal history of transient ischemic attack (TIA), and cerebral infarction without residual deficits: Secondary | ICD-10-CM | POA: Diagnosis not present

## 2015-10-19 DIAGNOSIS — Z9181 History of falling: Secondary | ICD-10-CM | POA: Diagnosis not present

## 2015-10-19 DIAGNOSIS — Z87891 Personal history of nicotine dependence: Secondary | ICD-10-CM | POA: Diagnosis not present

## 2015-10-19 DIAGNOSIS — N400 Enlarged prostate without lower urinary tract symptoms: Secondary | ICD-10-CM | POA: Diagnosis not present

## 2015-10-19 DIAGNOSIS — M1991 Primary osteoarthritis, unspecified site: Secondary | ICD-10-CM | POA: Diagnosis not present

## 2015-10-19 DIAGNOSIS — Z7901 Long term (current) use of anticoagulants: Secondary | ICD-10-CM | POA: Diagnosis not present

## 2015-10-19 DIAGNOSIS — G300 Alzheimer's disease with early onset: Secondary | ICD-10-CM | POA: Diagnosis not present

## 2015-10-19 DIAGNOSIS — I10 Essential (primary) hypertension: Secondary | ICD-10-CM | POA: Diagnosis not present

## 2015-10-19 DIAGNOSIS — I739 Peripheral vascular disease, unspecified: Secondary | ICD-10-CM | POA: Diagnosis not present

## 2015-10-19 DIAGNOSIS — Z7982 Long term (current) use of aspirin: Secondary | ICD-10-CM | POA: Diagnosis not present

## 2015-10-19 DIAGNOSIS — R2689 Other abnormalities of gait and mobility: Secondary | ICD-10-CM | POA: Diagnosis not present

## 2015-10-19 DIAGNOSIS — J45909 Unspecified asthma, uncomplicated: Secondary | ICD-10-CM | POA: Diagnosis not present

## 2015-10-21 DIAGNOSIS — Z87891 Personal history of nicotine dependence: Secondary | ICD-10-CM | POA: Diagnosis not present

## 2015-10-21 DIAGNOSIS — Z7901 Long term (current) use of anticoagulants: Secondary | ICD-10-CM | POA: Diagnosis not present

## 2015-10-21 DIAGNOSIS — Z7982 Long term (current) use of aspirin: Secondary | ICD-10-CM | POA: Diagnosis not present

## 2015-10-21 DIAGNOSIS — N400 Enlarged prostate without lower urinary tract symptoms: Secondary | ICD-10-CM | POA: Diagnosis not present

## 2015-10-21 DIAGNOSIS — I10 Essential (primary) hypertension: Secondary | ICD-10-CM | POA: Diagnosis not present

## 2015-10-21 DIAGNOSIS — I739 Peripheral vascular disease, unspecified: Secondary | ICD-10-CM | POA: Diagnosis not present

## 2015-10-21 DIAGNOSIS — Z9181 History of falling: Secondary | ICD-10-CM | POA: Diagnosis not present

## 2015-10-21 DIAGNOSIS — G300 Alzheimer's disease with early onset: Secondary | ICD-10-CM | POA: Diagnosis not present

## 2015-10-21 DIAGNOSIS — J45909 Unspecified asthma, uncomplicated: Secondary | ICD-10-CM | POA: Diagnosis not present

## 2015-10-21 DIAGNOSIS — Z8673 Personal history of transient ischemic attack (TIA), and cerebral infarction without residual deficits: Secondary | ICD-10-CM | POA: Diagnosis not present

## 2015-10-21 DIAGNOSIS — M1991 Primary osteoarthritis, unspecified site: Secondary | ICD-10-CM | POA: Diagnosis not present

## 2015-10-21 DIAGNOSIS — R2689 Other abnormalities of gait and mobility: Secondary | ICD-10-CM | POA: Diagnosis not present

## 2015-10-22 ENCOUNTER — Encounter: Payer: Self-pay | Admitting: Cardiovascular Disease

## 2015-10-22 ENCOUNTER — Ambulatory Visit (INDEPENDENT_AMBULATORY_CARE_PROVIDER_SITE_OTHER): Payer: Medicare Other | Admitting: Cardiovascular Disease

## 2015-10-22 VITALS — BP 111/58 | HR 58 | Ht 64.0 in | Wt 161.8 lb

## 2015-10-22 DIAGNOSIS — I10 Essential (primary) hypertension: Secondary | ICD-10-CM | POA: Diagnosis not present

## 2015-10-22 NOTE — Patient Instructions (Addendum)

## 2015-10-27 DIAGNOSIS — G300 Alzheimer's disease with early onset: Secondary | ICD-10-CM | POA: Diagnosis not present

## 2015-10-27 DIAGNOSIS — Z9181 History of falling: Secondary | ICD-10-CM | POA: Diagnosis not present

## 2015-10-27 DIAGNOSIS — J45909 Unspecified asthma, uncomplicated: Secondary | ICD-10-CM | POA: Diagnosis not present

## 2015-10-27 DIAGNOSIS — Z8673 Personal history of transient ischemic attack (TIA), and cerebral infarction without residual deficits: Secondary | ICD-10-CM | POA: Diagnosis not present

## 2015-10-27 DIAGNOSIS — Z7901 Long term (current) use of anticoagulants: Secondary | ICD-10-CM | POA: Diagnosis not present

## 2015-10-27 DIAGNOSIS — M1991 Primary osteoarthritis, unspecified site: Secondary | ICD-10-CM | POA: Diagnosis not present

## 2015-10-27 DIAGNOSIS — N400 Enlarged prostate without lower urinary tract symptoms: Secondary | ICD-10-CM | POA: Diagnosis not present

## 2015-10-27 DIAGNOSIS — I10 Essential (primary) hypertension: Secondary | ICD-10-CM | POA: Diagnosis not present

## 2015-10-27 DIAGNOSIS — Z7982 Long term (current) use of aspirin: Secondary | ICD-10-CM | POA: Diagnosis not present

## 2015-10-27 DIAGNOSIS — I739 Peripheral vascular disease, unspecified: Secondary | ICD-10-CM | POA: Diagnosis not present

## 2015-10-27 DIAGNOSIS — Z87891 Personal history of nicotine dependence: Secondary | ICD-10-CM | POA: Diagnosis not present

## 2015-10-27 DIAGNOSIS — R2689 Other abnormalities of gait and mobility: Secondary | ICD-10-CM | POA: Diagnosis not present

## 2015-10-28 DIAGNOSIS — I10 Essential (primary) hypertension: Secondary | ICD-10-CM | POA: Diagnosis not present

## 2015-10-28 DIAGNOSIS — I739 Peripheral vascular disease, unspecified: Secondary | ICD-10-CM | POA: Diagnosis not present

## 2015-10-28 DIAGNOSIS — M1991 Primary osteoarthritis, unspecified site: Secondary | ICD-10-CM | POA: Diagnosis not present

## 2015-10-28 DIAGNOSIS — R2689 Other abnormalities of gait and mobility: Secondary | ICD-10-CM | POA: Diagnosis not present

## 2015-10-28 DIAGNOSIS — Z8673 Personal history of transient ischemic attack (TIA), and cerebral infarction without residual deficits: Secondary | ICD-10-CM | POA: Diagnosis not present

## 2015-10-28 DIAGNOSIS — J45909 Unspecified asthma, uncomplicated: Secondary | ICD-10-CM | POA: Diagnosis not present

## 2015-10-28 DIAGNOSIS — Z7982 Long term (current) use of aspirin: Secondary | ICD-10-CM | POA: Diagnosis not present

## 2015-10-28 DIAGNOSIS — Z87891 Personal history of nicotine dependence: Secondary | ICD-10-CM | POA: Diagnosis not present

## 2015-10-28 DIAGNOSIS — Z7901 Long term (current) use of anticoagulants: Secondary | ICD-10-CM | POA: Diagnosis not present

## 2015-10-28 DIAGNOSIS — G300 Alzheimer's disease with early onset: Secondary | ICD-10-CM | POA: Diagnosis not present

## 2015-10-28 DIAGNOSIS — N400 Enlarged prostate without lower urinary tract symptoms: Secondary | ICD-10-CM | POA: Diagnosis not present

## 2015-10-28 DIAGNOSIS — Z9181 History of falling: Secondary | ICD-10-CM | POA: Diagnosis not present

## 2015-10-31 ENCOUNTER — Other Ambulatory Visit: Payer: Self-pay | Admitting: Cardiovascular Disease

## 2015-11-02 ENCOUNTER — Telehealth: Payer: Self-pay

## 2015-11-02 DIAGNOSIS — I10 Essential (primary) hypertension: Secondary | ICD-10-CM | POA: Diagnosis not present

## 2015-11-02 DIAGNOSIS — Z9181 History of falling: Secondary | ICD-10-CM | POA: Diagnosis not present

## 2015-11-02 DIAGNOSIS — R2689 Other abnormalities of gait and mobility: Secondary | ICD-10-CM | POA: Diagnosis not present

## 2015-11-02 DIAGNOSIS — Z7901 Long term (current) use of anticoagulants: Secondary | ICD-10-CM | POA: Diagnosis not present

## 2015-11-02 DIAGNOSIS — G300 Alzheimer's disease with early onset: Secondary | ICD-10-CM | POA: Diagnosis not present

## 2015-11-02 DIAGNOSIS — Z8673 Personal history of transient ischemic attack (TIA), and cerebral infarction without residual deficits: Secondary | ICD-10-CM | POA: Diagnosis not present

## 2015-11-02 DIAGNOSIS — Z87891 Personal history of nicotine dependence: Secondary | ICD-10-CM | POA: Diagnosis not present

## 2015-11-02 DIAGNOSIS — I739 Peripheral vascular disease, unspecified: Secondary | ICD-10-CM | POA: Diagnosis not present

## 2015-11-02 DIAGNOSIS — M1991 Primary osteoarthritis, unspecified site: Secondary | ICD-10-CM | POA: Diagnosis not present

## 2015-11-02 DIAGNOSIS — Z7982 Long term (current) use of aspirin: Secondary | ICD-10-CM | POA: Diagnosis not present

## 2015-11-02 DIAGNOSIS — J45909 Unspecified asthma, uncomplicated: Secondary | ICD-10-CM | POA: Diagnosis not present

## 2015-11-02 DIAGNOSIS — N400 Enlarged prostate without lower urinary tract symptoms: Secondary | ICD-10-CM | POA: Diagnosis not present

## 2015-11-02 NOTE — Telephone Encounter (Signed)
Home Health Certification and Plan of Care sent by Laurena Spies for 10/14/2015 to 12/12/2015.   Placed in PCP box to sign. PCP will be out until Monday 11/08/15.

## 2015-11-04 DIAGNOSIS — N400 Enlarged prostate without lower urinary tract symptoms: Secondary | ICD-10-CM | POA: Diagnosis not present

## 2015-11-04 DIAGNOSIS — I10 Essential (primary) hypertension: Secondary | ICD-10-CM | POA: Diagnosis not present

## 2015-11-04 DIAGNOSIS — Z7901 Long term (current) use of anticoagulants: Secondary | ICD-10-CM | POA: Diagnosis not present

## 2015-11-04 DIAGNOSIS — Z87891 Personal history of nicotine dependence: Secondary | ICD-10-CM | POA: Diagnosis not present

## 2015-11-04 DIAGNOSIS — J45909 Unspecified asthma, uncomplicated: Secondary | ICD-10-CM | POA: Diagnosis not present

## 2015-11-04 DIAGNOSIS — Z7982 Long term (current) use of aspirin: Secondary | ICD-10-CM | POA: Diagnosis not present

## 2015-11-04 DIAGNOSIS — I739 Peripheral vascular disease, unspecified: Secondary | ICD-10-CM | POA: Diagnosis not present

## 2015-11-04 DIAGNOSIS — Z9181 History of falling: Secondary | ICD-10-CM | POA: Diagnosis not present

## 2015-11-04 DIAGNOSIS — R2689 Other abnormalities of gait and mobility: Secondary | ICD-10-CM | POA: Diagnosis not present

## 2015-11-04 DIAGNOSIS — G300 Alzheimer's disease with early onset: Secondary | ICD-10-CM | POA: Diagnosis not present

## 2015-11-04 DIAGNOSIS — Z8673 Personal history of transient ischemic attack (TIA), and cerebral infarction without residual deficits: Secondary | ICD-10-CM | POA: Diagnosis not present

## 2015-11-04 DIAGNOSIS — M1991 Primary osteoarthritis, unspecified site: Secondary | ICD-10-CM | POA: Diagnosis not present

## 2015-11-05 ENCOUNTER — Ambulatory Visit: Payer: Medicare Other | Admitting: Internal Medicine

## 2015-11-06 ENCOUNTER — Other Ambulatory Visit: Payer: Self-pay | Admitting: Cardiology

## 2015-11-08 DIAGNOSIS — G300 Alzheimer's disease with early onset: Secondary | ICD-10-CM | POA: Diagnosis not present

## 2015-11-08 NOTE — Telephone Encounter (Signed)
Paperwork signed, faxed, copy sent to scan 

## 2015-11-09 ENCOUNTER — Ambulatory Visit (INDEPENDENT_AMBULATORY_CARE_PROVIDER_SITE_OTHER): Payer: Medicare Other | Admitting: Pharmacist

## 2015-11-09 DIAGNOSIS — Z5181 Encounter for therapeutic drug level monitoring: Secondary | ICD-10-CM

## 2015-11-09 DIAGNOSIS — Z8679 Personal history of other diseases of the circulatory system: Secondary | ICD-10-CM | POA: Diagnosis not present

## 2015-11-09 DIAGNOSIS — Z8673 Personal history of transient ischemic attack (TIA), and cerebral infarction without residual deficits: Secondary | ICD-10-CM

## 2015-11-09 DIAGNOSIS — I639 Cerebral infarction, unspecified: Secondary | ICD-10-CM

## 2015-11-09 DIAGNOSIS — G459 Transient cerebral ischemic attack, unspecified: Secondary | ICD-10-CM | POA: Diagnosis not present

## 2015-11-09 LAB — POCT INR: INR: 1.9

## 2015-11-11 DIAGNOSIS — Z8673 Personal history of transient ischemic attack (TIA), and cerebral infarction without residual deficits: Secondary | ICD-10-CM | POA: Diagnosis not present

## 2015-11-11 DIAGNOSIS — Z9181 History of falling: Secondary | ICD-10-CM | POA: Diagnosis not present

## 2015-11-11 DIAGNOSIS — Z7901 Long term (current) use of anticoagulants: Secondary | ICD-10-CM | POA: Diagnosis not present

## 2015-11-11 DIAGNOSIS — N400 Enlarged prostate without lower urinary tract symptoms: Secondary | ICD-10-CM | POA: Diagnosis not present

## 2015-11-11 DIAGNOSIS — J45909 Unspecified asthma, uncomplicated: Secondary | ICD-10-CM | POA: Diagnosis not present

## 2015-11-11 DIAGNOSIS — Z87891 Personal history of nicotine dependence: Secondary | ICD-10-CM | POA: Diagnosis not present

## 2015-11-11 DIAGNOSIS — I10 Essential (primary) hypertension: Secondary | ICD-10-CM | POA: Diagnosis not present

## 2015-11-11 DIAGNOSIS — G300 Alzheimer's disease with early onset: Secondary | ICD-10-CM | POA: Diagnosis not present

## 2015-11-11 DIAGNOSIS — I739 Peripheral vascular disease, unspecified: Secondary | ICD-10-CM | POA: Diagnosis not present

## 2015-11-11 DIAGNOSIS — Z7982 Long term (current) use of aspirin: Secondary | ICD-10-CM | POA: Diagnosis not present

## 2015-11-11 DIAGNOSIS — M1991 Primary osteoarthritis, unspecified site: Secondary | ICD-10-CM | POA: Diagnosis not present

## 2015-11-11 DIAGNOSIS — R2689 Other abnormalities of gait and mobility: Secondary | ICD-10-CM | POA: Diagnosis not present

## 2015-11-12 DIAGNOSIS — Z7982 Long term (current) use of aspirin: Secondary | ICD-10-CM | POA: Diagnosis not present

## 2015-11-12 DIAGNOSIS — G300 Alzheimer's disease with early onset: Secondary | ICD-10-CM | POA: Diagnosis not present

## 2015-11-12 DIAGNOSIS — Z87891 Personal history of nicotine dependence: Secondary | ICD-10-CM | POA: Diagnosis not present

## 2015-11-12 DIAGNOSIS — Z9181 History of falling: Secondary | ICD-10-CM | POA: Diagnosis not present

## 2015-11-12 DIAGNOSIS — I739 Peripheral vascular disease, unspecified: Secondary | ICD-10-CM | POA: Diagnosis not present

## 2015-11-12 DIAGNOSIS — I10 Essential (primary) hypertension: Secondary | ICD-10-CM | POA: Diagnosis not present

## 2015-11-12 DIAGNOSIS — R2689 Other abnormalities of gait and mobility: Secondary | ICD-10-CM | POA: Diagnosis not present

## 2015-11-12 DIAGNOSIS — N400 Enlarged prostate without lower urinary tract symptoms: Secondary | ICD-10-CM | POA: Diagnosis not present

## 2015-11-12 DIAGNOSIS — Z8673 Personal history of transient ischemic attack (TIA), and cerebral infarction without residual deficits: Secondary | ICD-10-CM | POA: Diagnosis not present

## 2015-11-12 DIAGNOSIS — Z7901 Long term (current) use of anticoagulants: Secondary | ICD-10-CM | POA: Diagnosis not present

## 2015-11-12 DIAGNOSIS — J45909 Unspecified asthma, uncomplicated: Secondary | ICD-10-CM | POA: Diagnosis not present

## 2015-11-12 DIAGNOSIS — M1991 Primary osteoarthritis, unspecified site: Secondary | ICD-10-CM | POA: Diagnosis not present

## 2015-11-16 ENCOUNTER — Ambulatory Visit (INDEPENDENT_AMBULATORY_CARE_PROVIDER_SITE_OTHER): Payer: Medicare Other | Admitting: Internal Medicine

## 2015-11-16 ENCOUNTER — Encounter: Payer: Self-pay | Admitting: Internal Medicine

## 2015-11-16 VITALS — BP 130/70 | HR 56 | Wt 163.0 lb

## 2015-11-16 DIAGNOSIS — I10 Essential (primary) hypertension: Secondary | ICD-10-CM

## 2015-11-16 DIAGNOSIS — H109 Unspecified conjunctivitis: Secondary | ICD-10-CM

## 2015-11-16 DIAGNOSIS — R269 Unspecified abnormalities of gait and mobility: Secondary | ICD-10-CM | POA: Diagnosis not present

## 2015-11-16 DIAGNOSIS — R531 Weakness: Secondary | ICD-10-CM

## 2015-11-16 DIAGNOSIS — R413 Other amnesia: Secondary | ICD-10-CM

## 2015-11-16 DIAGNOSIS — G459 Transient cerebral ischemic attack, unspecified: Secondary | ICD-10-CM

## 2015-11-16 MED ORDER — ERYTHROMYCIN 5 MG/GM OP OINT: 1.0000 "application " | TOPICAL_OINTMENT | Freq: Three times a day (TID) | OPHTHALMIC | Status: DC

## 2015-11-16 NOTE — Progress Notes (Signed)
Pre visit review using our clinic review tool, if applicable. No additional management support is needed unless otherwise documented below in the visit note. 

## 2015-11-16 NOTE — Assessment & Plan Note (Signed)
No relapse 

## 2015-11-16 NOTE — Assessment & Plan Note (Signed)
Losartan, Verapamil, Clonidine 

## 2015-11-16 NOTE — Assessment & Plan Note (Signed)
OA, LBP, h/o CVA

## 2015-11-16 NOTE — Assessment & Plan Note (Signed)
On Namenda, Aricept 

## 2015-11-16 NOTE — Progress Notes (Signed)
Subjective:  Patient ID: Douglas [name], male    DOB: [birth year]  Age: 80 y.o. MRN: [id number]  CC: No chief complaint on file.   HPI Douglas [name] presents for Alzheimer's, HTN, gait disorder f/u  Outpatient Prescriptions Prior to Visit  Medication Sig Dispense Refill  . albuterol (PROAIR HFA) 108 (90 BASE) MCG/ACT inhaler Inhale 2 puffs into the lungs 4 (four) times daily as needed. For wheezing 8.5 g 3  . aspirin EC 81 MG tablet Take 81 mg by mouth daily.    . cholecalciferol (VITAMIN D) 1000 UNITS tablet Take 1,000 Units by mouth daily.      . cloNIDine (CATAPRES) 0.1 MG tablet TAKE 1 TABLET (0.1 MG TOTAL) BY MOUTH 2 (TWO) TIMES DAILY. 60 tablet 0  . diclofenac sodium (VOLTAREN) 1 % GEL APPLY 2 GRAMS TOPICALLY 4 TIMES A DAY AS NEEDED TO BOTH KNEES 100 g 1  . donepezil (ARICEPT) 10 MG tablet TAKE 1 TABLET (10 MG TOTAL) BY MOUTH EVERY MORNING. 90 tablet 3  . escitalopram (LEXAPRO) 5 MG tablet Take 1 tablet (5 mg total) by mouth daily. 30 tablet 5  . fluticasone (FLONASE) 50 MCG/ACT nasal spray Place 2 sprays into both nostrils daily. 16 g 11  . folic acid (FOLVITE) Q000111Q MCG tablet Take 800 mcg by mouth daily.    Marland Kitchen loratadine (CLARITIN) 10 MG tablet Take 1 tablet (10 mg total) by mouth daily. 100 tablet 3  . losartan (COZAAR) 100 MG tablet TAKE 1 TABLET (100 MG TOTAL) BY MOUTH DAILY. 30 tablet 4  . memantine (NAMENDA XR) 28 MG CP24 24 hr capsule Take 1 capsule (28 mg total) by mouth daily. 90 capsule 3  . omeprazole (PRILOSEC) 40 MG capsule TAKE ONE CAPSULE BY MOUTH TWICE DAILY 180 capsule 3  . tamsulosin (FLOMAX) 0.4 MG CAPS capsule TAKE 1 CAPSULE (0.4 MG TOTAL) BY MOUTH DAILY. 90 capsule 3  . verapamil (CALAN-SR) 180 MG CR tablet TAKE 1 TABLET (180 MG TOTAL) BY MOUTH DAILY. 30 tablet 4  . warfarin (COUMADIN) 2.5 MG tablet TAKE 2 TABLETS BY MOUTH DAILY, EXCEPT 1 TABLET ON MONDAYS, WEDNESDAYS,AND FRIDAYS, OR AS DIRECTED BY COUMADIN CLINIC 120 tablet 1  . ipratropium (ATROVENT)  0.06 % nasal spray Place 2 sprays into the nose 4 (four) times daily. 15 mL 11  . cloNIDine (CATAPRES) 0.1 MG tablet TAKE 1 TABLET (0.1 MG TOTAL) BY MOUTH 2 (TWO) TIMES DAILY. (Patient not taking: Reported on 11/16/2015) 60 tablet 11   Facility-Administered Medications Prior to Visit  Medication Dose Route Frequency Provider Last Rate Last Dose  . methylPREDNISolone acetate (DEPO-MEDROL) injection 80 mg  80 mg Intra-articular Once Chavela Justiniano V, MD      . methylPREDNISolone acetate (DEPO-MEDROL) injection 80 mg  80 mg Intra-articular Once Srija Southard V, MD        ROS Review of Systems  Constitutional: Negative for appetite change, fatigue and unexpected weight change.  HENT: Negative for congestion, nosebleeds, sneezing, sore throat and trouble swallowing.   Eyes: Negative for itching and visual disturbance.  Respiratory: Negative for cough.   Cardiovascular: Negative for chest pain, palpitations and leg swelling.  Gastrointestinal: Negative for nausea, diarrhea, blood in stool and abdominal distention.  Genitourinary: Negative for urgency, frequency and hematuria.  Musculoskeletal: Positive for back pain and arthralgias. Negative for joint swelling, gait problem and neck pain.  Skin: Negative for rash.  Neurological: Positive for weakness. Negative for dizziness, tremors and speech difficulty.  Psychiatric/Behavioral: Positive for confusion,  dysphoric mood and decreased concentration. Negative for suicidal ideas, sleep disturbance and agitation. The patient is nervous/anxious.     Objective:  BP 130/70 mmHg  Pulse 56  Wt 163 lb (73.936 kg)  SpO2 97%  BP Readings from Last 3 Encounters:  11/16/15 130/70  10/22/15 111/58  09/21/15 130/64    Wt Readings from Last 3 Encounters:  11/16/15 163 lb (73.936 kg)  10/22/15 161 lb 12.8 oz (73.392 kg)  09/21/15 163 lb (73.936 kg)    Physical Exam  Constitutional: He is oriented to person, place, and time. He appears  well-developed. No distress.  NAD  HENT:  Mouth/Throat: Oropharynx is clear and moist.  Eyes: Conjunctivae are normal. Pupils are equal, round, and reactive to light.  Neck: Normal range of motion. No JVD present. No thyromegaly present.  Cardiovascular: Normal rate, regular rhythm, normal heart sounds and intact distal pulses.  Exam reveals no gallop and no friction rub.   No murmur heard. Pulmonary/Chest: Effort normal and breath sounds normal. No respiratory distress. He has no wheezes. He has no rales. He exhibits no tenderness.  Abdominal: Soft. Bowel sounds are normal. He exhibits no distension and no mass. There is no tenderness. There is no rebound and no guarding.  Musculoskeletal: Normal range of motion. He exhibits no edema or tenderness.  Lymphadenopathy:    He has no cervical adenopathy.  Neurological: He is alert and oriented to person, place, and time. He has normal reflexes. No cranial nerve deficit. He exhibits normal muscle tone. He displays a negative Romberg sign. Coordination abnormal. Gait normal.  Skin: Skin is warm and dry. No rash noted.  Psychiatric: Judgment and thought content normal.  Cane  Disoriented  Lab Results  Component Value Date   WBC 6.6 09/21/2015   HGB 13.7 09/21/2015   HCT 41.2 09/21/2015   PLT 146.0* 09/21/2015   GLUCOSE 74 09/21/2015   CHOL 168 04/18/2013   TRIG 197.0* 04/18/2013   HDL 34.00* 04/18/2013   LDLDIRECT 137.9 05/11/2010   LDLCALC 95 04/18/2013   ALT 13 09/21/2015   AST 16 09/21/2015   NA 143 09/21/2015   K 4.4 09/21/2015   CL 108 09/21/2015   CREATININE 1.16 09/21/2015   BUN 19 09/21/2015   CO2 30 09/21/2015   TSH 2.11 11/13/2014   PSA 1.61 10/18/2012   INR 1.9 11/09/2015   HGBA1C 5.8 11/13/2014    Dg Chest 2 View  03/10/2015  CLINICAL DATA:  Chest pain.  Question confusion. EXAM: CHEST  2 VIEW COMPARISON:  10/08/2011 FINDINGS: Subtle densities in the left lower lung are nonspecific but could represent chronic  changes or atelectasis. No significant airspace disease. Heart and mediastinum are within normal limits. The trachea is midline. Atherosclerotic calcifications at the aortic arch. No large pleural effusions. Degenerative changes in thoracic spine. IMPRESSION: Subtle left basilar densities could represent atelectasis or chronic changes. No significant airspace disease. Electronically Signed   By: Markus Daft M.D.   On: 03/10/2015 12:47    Assessment & Plan:   There are no diagnoses linked to this encounter. I am having Douglas [name] maintain his cholecalciferol, aspirin EC, folic acid, albuterol, loratadine, fluticasone, ipratropium, tamsulosin, omeprazole, diclofenac sodium, donepezil, memantine, escitalopram, cloNIDine, warfarin, verapamil, and losartan. We will continue to administer methylPREDNISolone acetate and methylPREDNISolone acetate.  No orders of the defined types were placed in this encounter.     Follow-up: No Follow-up on file.  Walker Kehr, MD

## 2015-11-16 NOTE — Assessment & Plan Note (Signed)
3/17 Erythro oint

## 2015-11-16 NOTE — Assessment & Plan Note (Signed)
Stable no change

## 2015-11-22 DIAGNOSIS — N400 Enlarged prostate without lower urinary tract symptoms: Secondary | ICD-10-CM | POA: Diagnosis not present

## 2015-11-22 DIAGNOSIS — Z7982 Long term (current) use of aspirin: Secondary | ICD-10-CM | POA: Diagnosis not present

## 2015-11-22 DIAGNOSIS — Z9181 History of falling: Secondary | ICD-10-CM | POA: Diagnosis not present

## 2015-11-22 DIAGNOSIS — Z8673 Personal history of transient ischemic attack (TIA), and cerebral infarction without residual deficits: Secondary | ICD-10-CM | POA: Diagnosis not present

## 2015-11-22 DIAGNOSIS — I10 Essential (primary) hypertension: Secondary | ICD-10-CM | POA: Diagnosis not present

## 2015-11-22 DIAGNOSIS — Z87891 Personal history of nicotine dependence: Secondary | ICD-10-CM | POA: Diagnosis not present

## 2015-11-22 DIAGNOSIS — J45909 Unspecified asthma, uncomplicated: Secondary | ICD-10-CM | POA: Diagnosis not present

## 2015-11-22 DIAGNOSIS — Z7901 Long term (current) use of anticoagulants: Secondary | ICD-10-CM | POA: Diagnosis not present

## 2015-11-22 DIAGNOSIS — R2689 Other abnormalities of gait and mobility: Secondary | ICD-10-CM | POA: Diagnosis not present

## 2015-11-22 DIAGNOSIS — G300 Alzheimer's disease with early onset: Secondary | ICD-10-CM | POA: Diagnosis not present

## 2015-11-22 DIAGNOSIS — M1991 Primary osteoarthritis, unspecified site: Secondary | ICD-10-CM | POA: Diagnosis not present

## 2015-11-22 DIAGNOSIS — I739 Peripheral vascular disease, unspecified: Secondary | ICD-10-CM | POA: Diagnosis not present

## 2015-11-24 DIAGNOSIS — N400 Enlarged prostate without lower urinary tract symptoms: Secondary | ICD-10-CM | POA: Diagnosis not present

## 2015-11-24 DIAGNOSIS — Z7982 Long term (current) use of aspirin: Secondary | ICD-10-CM | POA: Diagnosis not present

## 2015-11-24 DIAGNOSIS — G300 Alzheimer's disease with early onset: Secondary | ICD-10-CM | POA: Diagnosis not present

## 2015-11-24 DIAGNOSIS — R2689 Other abnormalities of gait and mobility: Secondary | ICD-10-CM | POA: Diagnosis not present

## 2015-11-24 DIAGNOSIS — Z87891 Personal history of nicotine dependence: Secondary | ICD-10-CM | POA: Diagnosis not present

## 2015-11-24 DIAGNOSIS — M1991 Primary osteoarthritis, unspecified site: Secondary | ICD-10-CM | POA: Diagnosis not present

## 2015-11-24 DIAGNOSIS — Z7901 Long term (current) use of anticoagulants: Secondary | ICD-10-CM | POA: Diagnosis not present

## 2015-11-24 DIAGNOSIS — Z8673 Personal history of transient ischemic attack (TIA), and cerebral infarction without residual deficits: Secondary | ICD-10-CM | POA: Diagnosis not present

## 2015-11-24 DIAGNOSIS — J45909 Unspecified asthma, uncomplicated: Secondary | ICD-10-CM | POA: Diagnosis not present

## 2015-11-24 DIAGNOSIS — I10 Essential (primary) hypertension: Secondary | ICD-10-CM | POA: Diagnosis not present

## 2015-11-24 DIAGNOSIS — I739 Peripheral vascular disease, unspecified: Secondary | ICD-10-CM | POA: Diagnosis not present

## 2015-11-24 DIAGNOSIS — Z9181 History of falling: Secondary | ICD-10-CM | POA: Diagnosis not present

## 2015-11-29 DIAGNOSIS — J45909 Unspecified asthma, uncomplicated: Secondary | ICD-10-CM | POA: Diagnosis not present

## 2015-11-29 DIAGNOSIS — R2689 Other abnormalities of gait and mobility: Secondary | ICD-10-CM | POA: Diagnosis not present

## 2015-11-29 DIAGNOSIS — Z7982 Long term (current) use of aspirin: Secondary | ICD-10-CM | POA: Diagnosis not present

## 2015-11-29 DIAGNOSIS — M1991 Primary osteoarthritis, unspecified site: Secondary | ICD-10-CM | POA: Diagnosis not present

## 2015-11-29 DIAGNOSIS — Z9181 History of falling: Secondary | ICD-10-CM | POA: Diagnosis not present

## 2015-11-29 DIAGNOSIS — Z7901 Long term (current) use of anticoagulants: Secondary | ICD-10-CM | POA: Diagnosis not present

## 2015-11-29 DIAGNOSIS — Z8673 Personal history of transient ischemic attack (TIA), and cerebral infarction without residual deficits: Secondary | ICD-10-CM | POA: Diagnosis not present

## 2015-11-29 DIAGNOSIS — I10 Essential (primary) hypertension: Secondary | ICD-10-CM | POA: Diagnosis not present

## 2015-11-29 DIAGNOSIS — Z87891 Personal history of nicotine dependence: Secondary | ICD-10-CM | POA: Diagnosis not present

## 2015-11-29 DIAGNOSIS — I739 Peripheral vascular disease, unspecified: Secondary | ICD-10-CM | POA: Diagnosis not present

## 2015-11-29 DIAGNOSIS — G300 Alzheimer's disease with early onset: Secondary | ICD-10-CM | POA: Diagnosis not present

## 2015-11-29 DIAGNOSIS — N400 Enlarged prostate without lower urinary tract symptoms: Secondary | ICD-10-CM | POA: Diagnosis not present

## 2015-12-01 DIAGNOSIS — J45909 Unspecified asthma, uncomplicated: Secondary | ICD-10-CM | POA: Diagnosis not present

## 2015-12-01 DIAGNOSIS — N400 Enlarged prostate without lower urinary tract symptoms: Secondary | ICD-10-CM | POA: Diagnosis not present

## 2015-12-01 DIAGNOSIS — I10 Essential (primary) hypertension: Secondary | ICD-10-CM | POA: Diagnosis not present

## 2015-12-01 DIAGNOSIS — M1991 Primary osteoarthritis, unspecified site: Secondary | ICD-10-CM | POA: Diagnosis not present

## 2015-12-01 DIAGNOSIS — Z7982 Long term (current) use of aspirin: Secondary | ICD-10-CM | POA: Diagnosis not present

## 2015-12-01 DIAGNOSIS — G300 Alzheimer's disease with early onset: Secondary | ICD-10-CM | POA: Diagnosis not present

## 2015-12-01 DIAGNOSIS — Z7901 Long term (current) use of anticoagulants: Secondary | ICD-10-CM | POA: Diagnosis not present

## 2015-12-01 DIAGNOSIS — Z87891 Personal history of nicotine dependence: Secondary | ICD-10-CM | POA: Diagnosis not present

## 2015-12-01 DIAGNOSIS — R2689 Other abnormalities of gait and mobility: Secondary | ICD-10-CM | POA: Diagnosis not present

## 2015-12-01 DIAGNOSIS — Z8673 Personal history of transient ischemic attack (TIA), and cerebral infarction without residual deficits: Secondary | ICD-10-CM | POA: Diagnosis not present

## 2015-12-01 DIAGNOSIS — I739 Peripheral vascular disease, unspecified: Secondary | ICD-10-CM | POA: Diagnosis not present

## 2015-12-01 DIAGNOSIS — Z9181 History of falling: Secondary | ICD-10-CM | POA: Diagnosis not present

## 2015-12-06 DIAGNOSIS — G300 Alzheimer's disease with early onset: Secondary | ICD-10-CM | POA: Diagnosis not present

## 2015-12-06 DIAGNOSIS — Z8673 Personal history of transient ischemic attack (TIA), and cerebral infarction without residual deficits: Secondary | ICD-10-CM | POA: Diagnosis not present

## 2015-12-06 DIAGNOSIS — R2689 Other abnormalities of gait and mobility: Secondary | ICD-10-CM | POA: Diagnosis not present

## 2015-12-06 DIAGNOSIS — Z7901 Long term (current) use of anticoagulants: Secondary | ICD-10-CM | POA: Diagnosis not present

## 2015-12-06 DIAGNOSIS — Z87891 Personal history of nicotine dependence: Secondary | ICD-10-CM | POA: Diagnosis not present

## 2015-12-06 DIAGNOSIS — J45909 Unspecified asthma, uncomplicated: Secondary | ICD-10-CM | POA: Diagnosis not present

## 2015-12-06 DIAGNOSIS — I10 Essential (primary) hypertension: Secondary | ICD-10-CM | POA: Diagnosis not present

## 2015-12-06 DIAGNOSIS — Z7982 Long term (current) use of aspirin: Secondary | ICD-10-CM | POA: Diagnosis not present

## 2015-12-06 DIAGNOSIS — Z9181 History of falling: Secondary | ICD-10-CM | POA: Diagnosis not present

## 2015-12-06 DIAGNOSIS — N400 Enlarged prostate without lower urinary tract symptoms: Secondary | ICD-10-CM | POA: Diagnosis not present

## 2015-12-06 DIAGNOSIS — M1991 Primary osteoarthritis, unspecified site: Secondary | ICD-10-CM | POA: Diagnosis not present

## 2015-12-06 DIAGNOSIS — I739 Peripheral vascular disease, unspecified: Secondary | ICD-10-CM | POA: Diagnosis not present

## 2015-12-07 ENCOUNTER — Ambulatory Visit (INDEPENDENT_AMBULATORY_CARE_PROVIDER_SITE_OTHER): Payer: Medicare Other | Admitting: Surgery

## 2015-12-07 DIAGNOSIS — G459 Transient cerebral ischemic attack, unspecified: Secondary | ICD-10-CM

## 2015-12-07 DIAGNOSIS — I639 Cerebral infarction, unspecified: Secondary | ICD-10-CM | POA: Diagnosis not present

## 2015-12-07 DIAGNOSIS — Z5181 Encounter for therapeutic drug level monitoring: Secondary | ICD-10-CM | POA: Diagnosis not present

## 2015-12-07 DIAGNOSIS — Z8673 Personal history of transient ischemic attack (TIA), and cerebral infarction without residual deficits: Secondary | ICD-10-CM

## 2015-12-07 DIAGNOSIS — Z8679 Personal history of other diseases of the circulatory system: Secondary | ICD-10-CM | POA: Diagnosis not present

## 2015-12-07 LAB — POCT INR: INR: 1.7

## 2015-12-08 DIAGNOSIS — Z9181 History of falling: Secondary | ICD-10-CM | POA: Diagnosis not present

## 2015-12-08 DIAGNOSIS — M1991 Primary osteoarthritis, unspecified site: Secondary | ICD-10-CM | POA: Diagnosis not present

## 2015-12-08 DIAGNOSIS — Z7982 Long term (current) use of aspirin: Secondary | ICD-10-CM | POA: Diagnosis not present

## 2015-12-08 DIAGNOSIS — R2689 Other abnormalities of gait and mobility: Secondary | ICD-10-CM | POA: Diagnosis not present

## 2015-12-08 DIAGNOSIS — I10 Essential (primary) hypertension: Secondary | ICD-10-CM | POA: Diagnosis not present

## 2015-12-08 DIAGNOSIS — J45909 Unspecified asthma, uncomplicated: Secondary | ICD-10-CM | POA: Diagnosis not present

## 2015-12-08 DIAGNOSIS — I739 Peripheral vascular disease, unspecified: Secondary | ICD-10-CM | POA: Diagnosis not present

## 2015-12-08 DIAGNOSIS — G300 Alzheimer's disease with early onset: Secondary | ICD-10-CM | POA: Diagnosis not present

## 2015-12-08 DIAGNOSIS — Z87891 Personal history of nicotine dependence: Secondary | ICD-10-CM | POA: Diagnosis not present

## 2015-12-08 DIAGNOSIS — Z7901 Long term (current) use of anticoagulants: Secondary | ICD-10-CM | POA: Diagnosis not present

## 2015-12-08 DIAGNOSIS — Z8673 Personal history of transient ischemic attack (TIA), and cerebral infarction without residual deficits: Secondary | ICD-10-CM | POA: Diagnosis not present

## 2015-12-08 DIAGNOSIS — N400 Enlarged prostate without lower urinary tract symptoms: Secondary | ICD-10-CM | POA: Diagnosis not present

## 2015-12-16 ENCOUNTER — Encounter: Payer: Self-pay | Admitting: Neurology

## 2015-12-16 ENCOUNTER — Ambulatory Visit (INDEPENDENT_AMBULATORY_CARE_PROVIDER_SITE_OTHER): Payer: Medicare Other | Admitting: Neurology

## 2015-12-16 VITALS — BP 170/82 | HR 78 | Resp 16 | Ht 64.0 in | Wt 163.0 lb

## 2015-12-16 DIAGNOSIS — R930 Abnormal findings on diagnostic imaging of skull and head, not elsewhere classified: Secondary | ICD-10-CM | POA: Diagnosis not present

## 2015-12-16 DIAGNOSIS — G309 Alzheimer's disease, unspecified: Secondary | ICD-10-CM | POA: Diagnosis not present

## 2015-12-16 DIAGNOSIS — M898X9 Other specified disorders of bone, unspecified site: Secondary | ICD-10-CM

## 2015-12-16 DIAGNOSIS — F0280 Dementia in other diseases classified elsewhere without behavioral disturbance: Secondary | ICD-10-CM

## 2015-12-16 DIAGNOSIS — R6889 Other general symptoms and signs: Secondary | ICD-10-CM

## 2015-12-16 NOTE — Patient Instructions (Signed)
We will do a CT of the head for comparison from 2013.   We will call you with the results.

## 2015-12-16 NOTE — Progress Notes (Signed)
Subjective:    Patient ID: Douglas [name] is a 81 y.o. male.  HPI     Interim history:   Douglas [name] is a very pleasant 80 year old right-handed gentleman with an underlying medical history of hypertension, kidney stones, lupus anticoagulant, hyperlipidemia, left-sided stroke in 2004, vitamin D deficiency, reflux disease, prostate hypertrophy, colonic polyps, right knee pain and status post rotator cuff surgery bilaterally, who presents for followup consultation of his dementia. He is accompanied by his daughter, Douglas [name], and wife, Douglas [name], today. He presents for a sooner than scheduled appointment secondary to a "bump noted" in his head, per wife. I last saw him on 09/16/2015, at which time the patient reported that his memory was getting worse and that he was more depressed. His daughter felt that his depression and anxiety were worse. He and his wife moved into a townhouse in [place] to be closer to Douglas [name], his daughter. His wife was having additional stress because of her mother's illness and having to go back and forth between the hospital and home, taking care of her critically ill mother. Transitioning to new living environment was difficult for him. He had more issues sleeping. He tried melatonin. He had a recent stress test which I reviewed at the time, which showed benign findings. He has a family history of dementia and his daughter added that he was talking about end-of-life quite a bit. He was not suicidal however. He had knee pain. His memory scores had been declining, in January 2017 his MMSE was 11 out of 30. I suggested we try him on low-dose Lexapro for his mood disorder, we continued Namenda long-acting at the maximum dose and Aricept 10 mg once daily generic.  Today, 12/16/2015: He reports occasional headaches. He has not had a recent fall per family. Sadly, Douglas [name] lost her mom after prolonged illness in January 2017. She has noticed a bony prominence in the back of his  right head. He is not tender on palpation there but she has not noticed this asymmetry before. He has some hallucinations at night and is more restless at night. He does not sleep very well. We mutually reviewed his CT head from 2013 and I reviewed his brain MRI from April 2016 again today.  Previously:  I saw him on 03/19/2015 for a sooner than scheduled appointment secondary to more confusion reported, recent falls twice a few weeks prior and more gait instability. He reported improved knee pain since he started Synvisc injections. He had 2 injections thus far and was supposed to have a third injection soon. He was able to tolerate his memory medications. His last brain MRI without contrast on 12/12/2014 showed: This is an abnormal MRI of the brain without contrast showing prior strokes in the left occipital and right parieto-occipital lobes.   These were present on CT scan dated 10/08/2011. Additionally, there are severe chronic small vessel ischemic changes in both hemispheres.   When compared to a CT scan dated 10/08/2011, there has been some progression of the extent of cortical atrophy. There are no acute findings on the current study.  Additional note is made of chronic left maxillary and ethmoid sinusitis, similar to what was seen on the CT scan in 2013. He presented to the emergency room on 03/10/2015 with chest pain. Workup in the emergency room was negative. He had seen a cardiologist and was scheduled for stress test. His MMSE was 17/30, CDT: 1/4, AFT: 7/min at the time and I suggested we continue with his dual  memory medication regimen.   I saw him on 11/30/2014, at which time his daughter reported that he had had 2 falls within a week recently. Both times he fell in the bathroom. He did not sustain any injuries thankfully. Since then they made accommodations in their bathrooms and removed all rugs to avoid tripping. He was using a cane more consistently and had a walker available. I asked him  to continue with generic Aricept and Namenda XR. His MMSE was 16 out of 30 at the time, clock drawing was 1 out of 4, animal fluency was 8/m. I ordered an MRI brain. We called his wife with the test results.  I saw him on 09/25/2014, at which time his MMSE was 16, clock drawing was 1, AFT was 8. He stated he had good and bad days. I changed his medication to Namzaric once daily.  I saw him on 03/20/14, at which time he reported doing well overall, but his knees were bothering him. He had a steroid injection into the R knee in 4/15, which helped some. He saw Dr. Dion Saucier. Sadly, his middle daughter (adoptive) died of lung cancer at age 7 in March 2015. On a positive note, his youngest daughter, Douglas [name], was getting married. His oldest Daughter, Douglas [name], lives in [place]. His MMSE was 20/30, CDT 1/4, AFT 11/min in July 2015. We continued Namenda long-acting at 28 mg once daily and Aricept at 10 mg once daily.  I saw him on 10/24/2013, at which time I asked him to continue with Namenda long-acting 28 mg daily and Aricept 10 mg daily.  I saw him on 04/18/2013, at which time I increased his Namenda XR to 21 mg daily. However they were not able to fill it because of shortage of the medication and he has been on Namenda XR 28 mg since November 2014, per PCP.   I first met him on 01/10/2013, at which time his MMSE was 21, clock drawing was 2, animal fluency was 12. I felt that he most likely has Alzheimer's dementia. He had some decline in his MMSE score. I asked him to start Namenda XR 7 mg once daily for one month then 14 mg once daily thereafter. I also asked him to see Dr. Leonides Cave for neurocognitive testing. He had that consultation in July 2014: In essence, findings were conclusive with mild Alzheimer's dementia. He did well with the addition of Namenda. He was tolerating it well and his wife was particularly pleased to see that he was thinking more clearly and was less irritable and less angry all the time. It  had made a significant difference in his cognitive functioning, they both agreed and he reported no SEs.    He previously followed with Dr. Avie Echevaria and was last seen by him on 07/19/2012, which time Dr. Sandria Manly felt the patient had mild dementia and discussed dementia related studies. While he did not change his medication he asked the patient to take his generic Aricept during the day rather than later at night.   He has an approximately 6 year history of memory loss, particularly forgetfulness. MRI brain with and without contrast in February 2009 showed an old infarct in the left occipital lobe, mild atrophy, and moderate small vessel disease. CT head without contrast in July 2009 and March 2012 showed no other new abnormalities. Vitamin B12 level and TSH were normal in March 2012. The patient is exercising regularly. He is independent in his daily living and drives a car. She has  some depression but is not on an antidepressant. In August 2012 his MMSE was 27, clock drawing was 4, animal fluency was 14. In February 2013 his MMSE was 23, clock drawing was 4, animal fluency was 12. In July 2013 his MMSE was 23, clock drawing was 3, animal fluency was 8.      His Past Medical History Is Significant For: Past Medical History  Diagnosis Date  . GERD 03/15/2007    erosive esophagitis  . HYPERTENSION 03/15/2007  . HYPERLIPIDEMIA 10/25/2007  . OSTEOARTHRITIS 03/15/2007    right knee  . CVA (cerebral infarction) 10/27/2010  . CEREBROVASCULAR ACCIDENT, HX OF 05/17/2007  . COLONIC POLYPS, HX OF 10/25/2007  . 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 BENIGN PROSTATIC HYPERTROPHY 08/12/2007  . PERIPHERAL VASCULAR DISEASE 10/16/2007    carotids < 70%  . TRANSIENT ISCHEMIC ATTACK 03/19/2008  . Nephrolithiasis   . Vitamin D deficiency   . Acquired deviated nasal septum   . Chronic maxillary sinusitis   . Chronic ethmoidal sinusitis   . Blood disorder   . Chronic anticoagulation   . Lupus anticoagulant  disorder ([place])     initially diagnosed in 2004 after CVA  . Hiatal hernia   . Stroke ([place])   . Allergy   . Clotting disorder ([facility])     clotting disorder    His Past Surgical History Is Significant For: Past Surgical History  Procedure Laterality Date  . Lung biopsy  1980    s/p  . Nasal sinus surgery      s/p   . Lithotripsy      s/p  . Rotator cuff repair      x 2  . Nasal septal deviation repair    . Colonoscopy      His Family History Is Significant For: Family History  Problem Relation Age of Onset  . Alzheimer's disease Father   . Heart disease Father   . Hypertension Other   . Allergies    . Hearing loss    . Hypertension    . Rheum arthritis    . Stroke Paternal Grandmother   . Heart attack Maternal Grandfather   . Leukemia Mother   . Colon cancer Neg Hx   . Esophageal cancer Neg Hx   . Stomach cancer Neg Hx   . Rectal cancer Neg Hx     His Social History Is Significant For: Social History   Social History  . Marital Status: Married    Spouse Name: N/A  . Number of Children: N/A  . Years of Education: N/A   Occupational History  . RETIRED [employer] Douglas [name]   Social History Main Topics  . Smoking status: Former Smoker -- 0.50 packs/day for 20 years    Types: Cigarettes    Quit date: 08/28/1973  . Smokeless tobacco: Former Systems developer    Types: [place] date: 08/28/1960  . Alcohol Use: 0.0 oz/week    0 Standard drinks or equivalent per week     Comment: social  . Drug Use: Yes    Special: Methaqualone  . Sexual Activity: Yes   Other Topics Concern  . None   Social History Narrative    His Allergies Are:  Allergies  Allergen Reactions  . Cefuroxime Axetil Other (See Comments)    bad taste  . Hydrochlorothiazide Other (See Comments)    unknown  . Iohexol Hives       . Lovastatin Other (See Comments)    cramps  .  Niacin Other (See Comments)    burning  . Pseudoeph-Doxylamine-Dm-Apap Other (See Comments)    tremor  . Ramipril  Cough  . Tramadol Hcl Other (See Comments)    hallucinating  . Viagra [Sildenafil Citrate]     Feverish feeling  :   His Current Medications Are:  Outpatient Encounter Prescriptions as of 12/16/2015  Medication Sig  . albuterol (PROAIR HFA) 108 (90 BASE) MCG/ACT inhaler Inhale 2 puffs into the lungs 4 (four) times daily as needed. For wheezing  . aspirin EC 81 MG tablet Take 81 mg by mouth daily.  . cholecalciferol (VITAMIN D) 1000 UNITS tablet Take 1,000 Units by mouth daily.    . cloNIDine (CATAPRES) 0.1 MG tablet TAKE 1 TABLET (0.1 MG TOTAL) BY MOUTH 2 (TWO) TIMES DAILY.  Marland Kitchen diclofenac sodium (VOLTAREN) 1 % GEL APPLY 2 GRAMS TOPICALLY 4 TIMES A DAY AS NEEDED TO BOTH KNEES  . donepezil (ARICEPT) 10 MG tablet TAKE 1 TABLET (10 MG TOTAL) BY MOUTH EVERY MORNING.  Marland Kitchen erythromycin ophthalmic ointment Place 1 application into the left eye 3 (three) times daily. X 5 days  . escitalopram (LEXAPRO) 5 MG tablet Take 1 tablet (5 mg total) by mouth daily.  . fluticasone (FLONASE) 50 MCG/ACT nasal spray Place 2 sprays into both nostrils daily.  . folic acid (FOLVITE) 824 MCG tablet Take 800 mcg by mouth daily.  Marland Kitchen ipratropium (ATROVENT) 0.06 % nasal spray Place 2 sprays into the nose 4 (four) times daily.  Marland Kitchen loratadine (CLARITIN) 10 MG tablet Take 1 tablet (10 mg total) by mouth daily.  Marland Kitchen losartan (COZAAR) 100 MG tablet TAKE 1 TABLET (100 MG TOTAL) BY MOUTH DAILY.  . memantine (NAMENDA XR) 28 MG CP24 24 hr capsule Take 1 capsule (28 mg total) by mouth daily.  Marland Kitchen omeprazole (PRILOSEC) 40 MG capsule TAKE ONE CAPSULE BY MOUTH TWICE DAILY  . tamsulosin (FLOMAX) 0.4 MG CAPS capsule TAKE 1 CAPSULE (0.4 MG TOTAL) BY MOUTH DAILY.  . verapamil (CALAN-SR) 180 MG CR tablet TAKE 1 TABLET (180 MG TOTAL) BY MOUTH DAILY.  Marland Kitchen warfarin (COUMADIN) 2.5 MG tablet TAKE 2 TABLETS BY MOUTH DAILY, EXCEPT 1 TABLET ON MONDAYS, WEDNESDAYS,AND FRIDAYS, OR AS DIRECTED BY COUMADIN CLINIC   Facility-Administered Encounter Medications  as of 12/16/2015  Medication  . methylPREDNISolone acetate (DEPO-MEDROL) injection 80 mg  . methylPREDNISolone acetate (DEPO-MEDROL) injection 80 mg  :  Review of Systems:  Out of a complete 14 point review of systems, all are reviewed and negative with the exception of these symptoms as listed below:   Review of Systems  Neurological:       Wife reports that patient has a bump on his head, and feels like one side of his head is larger than the other. Denies any recent falls.  States that patient has had some changes in balance and memory.     Objective:  Neurologic Exam  Physical Exam Physical Examination:   Filed Vitals:   12/16/15 1634  BP: 170/82  Pulse: 78  Resp: 16    General Examination: The patient is a very pleasant 80 y.o. male in no acute distress. He is calm and cooperative with the exam. He denies Auditory Hallucinations and Visual Hallucinations. He is talkative and upbeat today, good spirits, but does talk about a man, that comes at night "to beat him up" (nocturnal hallucinations reported).   HEENT: Atraumatic, R posterior skull more prominent than L, not sure how new, feels, like bone, no lesion, no lipoma.  Pupils are equal, round and reactive to light and accommodation. Extraocular tracking shows mild saccadic breakdown without nystagmus noted. Hearing is mildly impaired. Face is symmetric with no facial masking and normal facial sensation. There is no lip, neck or jaw tremor. Neck is not rigid with intact passive ROM. There are no carotid bruits on auscultation. Oropharynx exam reveals mild mouth dryness. No significant airway crowding is noted. Mallampati is class II. Tongue protrudes centrally and palate elevates symmetrically.    Chest: is clear to auscultation without wheezing, rhonchi or crackles noted.  Heart: sounds are regular and normal without murmurs, rubs or gallops noted.   Abdomen: is soft, non-tender and non-distended with normal bowel sounds  appreciated on auscultation.  Extremities: There is trace pitting edema in the distal lower extremities bilaterally.   Skin: is warm and dry with no trophic changes noted.  Musculoskeletal: exam reveals no obvious joint deformities, tenderness or joint swelling or erythema, except b/l knee pain. He has more lumbar kyphosis.   Neurologically:  Mental status: The patient is awake and alert, paying fairly good attention. He is able to partially provide the history. His daughter and wife provide most of the history. His memory, attention, language and knowledge are impaired.   MMSE: 21/30, CDT: 1/4, and AFT: 8/min. There is no aphasia, agnosia, apraxia or anomia. There is a mild degree of bradyphrenia. Speech is not hypophonic with no dysarthria noted. Mood is congruent and affect is normal. He has more word finding difficult.   His MMSE score in May 2014 was 21/30, CDT 2/4, AFT (Animal Fluency Test) score was 12.    On 03/20/2014: MMSE: 20/30, CDT: 1/4, AFT: 11  On 09/25/2014: MMSE: 16/30, CDT: 1/4, AFT: 8, GDS: 3/15.  On 03/19/2015: MMSE: 17/30, CDT: 1/4, AFT: 7/min.   On 09/16/2015: MMSE: 11/30, CDT: 0/4, AFT: 6/min.   Cranial nerves are as described above under HEENT exam. In addition, shoulder shrug is normal with equal shoulder height noted.  Motor exam: Normal bulk, and strength for age is noted. Tone is not rigid with absence of cogwheeling. There is overall no bradykinesia. There is no drift or rebound. There is no tremor. Romberg is negative. Reflexes are 1+ in the upper extremities and 1+ in the lower extremities. Toes are downgoing bilaterally. Fine motor skills: Finger taps, hand movements, and rapid alternating patting are mildly impaired bilaterally. Foot taps and foot agility are mildly impaired bilaterally.   Cerebellar testing shows no dysmetria or intention tremor on finger to nose testing. Heel to shin is difficult for him, especially on the R. There is no truncal or gait  ataxia.   Sensory exam is intact to light touch in the upper and lower extremities.   Gait, station and balance: He stands up from the seated position with no difficulty and needs no assistance. No veering to one side is noted. No leaning to one side. Posture is mild to moderately stooped in the low back. Stance is wide-based. He turns in 3 steps. Tandem walk is not possible. Balance is mild to moderately impaired. He has a single point cane and walks slowly and cautiously.   Assessment and Plan:   In summary, AHMOD GILLESPIE is a very pleasant 80 year old male with an underlying medical history of hypertension, kidney stones, lupus anticoagulant (on coumadin), hyperlipidemia, left-sided stroke in 2004, vitamin D deficiency, reflux disease, prostate hypertrophy, colonic polyps, b/l knee pain and status post rotator cuff surgery bilaterally, who presents for followup consultation of  his Alzheimer's dementia, without behavioral disturbance, on dual dementia medications with generic Aricept at 10 mg daily and Namenda long-acting at 28 mg daily. He has been able to tolerate these medications. However, in the past year, memory scores have declined. He was also showing signs of depression and we started Lexapro. He and his wife have moved to a new place in order to be closer to his daughter. His wife has noted a bump In the back of his head without and recent fall. It feels like a bony prominence, may be physiologic asymmetry but she feels this is new. I explained to her that with age sometimes skull bone does change and thickens. Nevertheless, since he also had some intermittent headaches, we mutually agreed to pursue a head CT without contrast. We will call her with the results. We will continue with his medications at the same doses. He has a family history of dementia and worries about it. He had a brain MRI on 12/12/2014 which showed no acute findings, prior strokes, more advanced small vessel disease and  more advanced cortical atrophy when compared to his prior head CT from 2/13. He had neuropsychological testing in July 2014 with findings in keeping with mild Alzheimer's dementia at the time. While we can consider repeating this, it may not change our management.  I will see him back routinely in July 2017. I answered all their questions today and the patient and his family were in agreement. I spent 25 in total face-to-face time with the patient, more 50% of which was spent in counseling and coordination of care, reviewing test results, reviewing medication and reviewing the diagnosis of AD, its prognosis and treatment options.

## 2015-12-20 ENCOUNTER — Ambulatory Visit
Admission: RE | Admit: 2015-12-20 | Discharge: 2015-12-20 | Disposition: A | Discharge status: Home / Self Care | Payer: Medicare Other | Source: Ambulatory Visit | Attending: Neurology | Admitting: Neurology

## 2015-12-20 ENCOUNTER — Ambulatory Visit (INDEPENDENT_AMBULATORY_CARE_PROVIDER_SITE_OTHER): Payer: Medicare Other

## 2015-12-20 DIAGNOSIS — I639 Cerebral infarction, unspecified: Secondary | ICD-10-CM

## 2015-12-20 DIAGNOSIS — R930 Abnormal findings on diagnostic imaging of skull and head, not elsewhere classified: Secondary | ICD-10-CM | POA: Diagnosis not present

## 2015-12-20 DIAGNOSIS — Z8673 Personal history of transient ischemic attack (TIA), and cerebral infarction without residual deficits: Secondary | ICD-10-CM

## 2015-12-20 DIAGNOSIS — G309 Alzheimer's disease, unspecified: Secondary | ICD-10-CM

## 2015-12-20 DIAGNOSIS — M898X9 Other specified disorders of bone, unspecified site: Secondary | ICD-10-CM

## 2015-12-20 DIAGNOSIS — Z8679 Personal history of other diseases of the circulatory system: Secondary | ICD-10-CM | POA: Diagnosis not present

## 2015-12-20 DIAGNOSIS — Z5181 Encounter for therapeutic drug level monitoring: Secondary | ICD-10-CM

## 2015-12-20 DIAGNOSIS — R6889 Other general symptoms and signs: Secondary | ICD-10-CM

## 2015-12-20 DIAGNOSIS — F0280 Dementia in other diseases classified elsewhere without behavioral disturbance: Secondary | ICD-10-CM

## 2015-12-20 DIAGNOSIS — G459 Transient cerebral ischemic attack, unspecified: Secondary | ICD-10-CM | POA: Diagnosis not present

## 2015-12-20 LAB — POCT INR: INR: 1.9

## 2015-12-20 NOTE — Progress Notes (Signed)
Quick Note:  Please call patient's wife regarding his head CT results from 12/20/2015: His blood vessel related changes and atrophy have progressed compared to his previous head CT, which we would have expected. There is some bony asymmetry of the skull in the back of his head, but no pathology is noted, nothing new or acute was seen, no unexpected findings. Star Age, MD, PhD [facility]  ______

## 2015-12-21 ENCOUNTER — Telehealth: Payer: Self-pay

## 2015-12-21 NOTE — Telephone Encounter (Signed)
-----   Message from Star Age, MD sent at 12/20/2015  4:24 PM EDT ----- Please call patient's wife regarding his head CT results from 12/20/2015: His blood vessel related changes and atrophy have progressed compared to his previous head CT, which we would have expected. There is some bony asymmetry of the skull in the back of his head, but no pathology is noted, nothing new or acute was seen, no unexpected findings. Star Age, MD, PhD [facility])

## 2015-12-21 NOTE — Telephone Encounter (Signed)
I spoke to wife and gave results and explained what this means. She voiced understanding. She stated that they had a doctor currently at the house and asked if I can give results to him. I read off below statement. He voiced understanding. No further questions from wife.

## 2015-12-27 ENCOUNTER — Ambulatory Visit (INDEPENDENT_AMBULATORY_CARE_PROVIDER_SITE_OTHER): Payer: Medicare Other

## 2015-12-27 DIAGNOSIS — Z111 Encounter for screening for respiratory tuberculosis: Secondary | ICD-10-CM | POA: Diagnosis not present

## 2015-12-29 ENCOUNTER — Ambulatory Visit (INDEPENDENT_AMBULATORY_CARE_PROVIDER_SITE_OTHER): Payer: Medicare Other | Admitting: Internal Medicine

## 2015-12-29 ENCOUNTER — Encounter: Payer: Self-pay | Admitting: Internal Medicine

## 2015-12-29 VITALS — BP 110/54 | HR 56 | Wt 167.0 lb

## 2015-12-29 DIAGNOSIS — Z7189 Other specified counseling: Secondary | ICD-10-CM | POA: Insufficient documentation

## 2015-12-29 DIAGNOSIS — H109 Unspecified conjunctivitis: Secondary | ICD-10-CM | POA: Diagnosis not present

## 2015-12-29 DIAGNOSIS — R269 Unspecified abnormalities of gait and mobility: Secondary | ICD-10-CM

## 2015-12-29 DIAGNOSIS — F0391 Unspecified dementia with behavioral disturbance: Secondary | ICD-10-CM

## 2015-12-29 LAB — TB SKIN TEST
INDURATION: 0 mm
TB Skin Test: NEGATIVE

## 2015-12-29 NOTE — Progress Notes (Signed)
Pre visit review using our clinic review tool, if applicable. No additional management support is needed unless otherwise documented below in the visit note. 

## 2015-12-29 NOTE — Assessment & Plan Note (Signed)
Douglas [name] is moving to [place] - papers filled out Aricept, [employer]

## 2015-12-29 NOTE — Assessment & Plan Note (Signed)
Form filled out

## 2015-12-29 NOTE — Progress Notes (Signed)
Subjective:  Patient ID: Douglas [name], male    DOB: June 13, 1936  [name]: 80 y.o. MRN: [id number]  CC: No chief complaint on file.   HPI Douglas [name] presents for Alzheimer's, falls, HTN, CAD f/u. Douglas [name] is moving to [place]. Douglas [name] has been confused; "seeing things" at times...  Outpatient Prescriptions Prior to Visit  Medication Sig Dispense Refill  . aspirin EC 81 MG tablet Take 81 mg by mouth daily.    . cholecalciferol (VITAMIN D) 1000 UNITS tablet Take 1,000 Units by mouth daily.      . cloNIDine (CATAPRES) 0.1 MG tablet TAKE 1 TABLET (0.1 MG TOTAL) BY MOUTH 2 (TWO) TIMES DAILY. 60 tablet 0  . diclofenac sodium (VOLTAREN) 1 % GEL APPLY 2 GRAMS TOPICALLY 4 TIMES A DAY AS NEEDED TO BOTH KNEES 100 g 1  . donepezil (ARICEPT) 10 MG tablet TAKE 1 TABLET (10 MG TOTAL) BY MOUTH EVERY MORNING. 90 tablet 3  . escitalopram (LEXAPRO) 5 MG tablet Take 1 tablet (5 mg total) by mouth daily. 30 tablet 5  . folic acid (FOLVITE) [id number] MCG tablet Take 800 mcg by mouth daily.    Marland Kitchen loratadine (CLARITIN) 10 MG tablet Take 1 tablet (10 mg total) by mouth daily. 100 tablet 3  . losartan (COZAAR) 100 MG tablet TAKE 1 TABLET (100 MG TOTAL) BY MOUTH DAILY. 30 tablet 4  . memantine (NAMENDA XR) 28 MG CP24 24 hr capsule Take 1 capsule (28 mg total) by mouth daily. 90 capsule 3  . omeprazole (PRILOSEC) 40 MG capsule TAKE ONE CAPSULE BY MOUTH TWICE DAILY 180 capsule 3  . tamsulosin (FLOMAX) 0.4 MG CAPS capsule TAKE 1 CAPSULE (0.4 MG TOTAL) BY MOUTH DAILY. 90 capsule 3  . verapamil (CALAN-SR) 180 MG CR tablet TAKE 1 TABLET (180 MG TOTAL) BY MOUTH DAILY. 30 tablet 4  . warfarin (COUMADIN) 2.5 MG tablet TAKE 2 TABLETS BY MOUTH DAILY, EXCEPT 1 TABLET ON MONDAYS, WEDNESDAYS,AND FRIDAYS, OR AS DIRECTED BY COUMADIN CLINIC 120 tablet 1  . albuterol (PROAIR HFA) 108 (90 BASE) MCG/ACT inhaler Inhale 2 puffs into the lungs 4 (four) times daily as needed. For wheezing (Patient not taking: Reported on  12/29/2015) 8.5 g 3  . erythromycin ophthalmic ointment Place 1 application into the left eye 3 (three) times daily. X 5 days (Patient not taking: Reported on 12/29/2015) 3.5 g 1  . fluticasone (FLONASE) 50 MCG/ACT nasal spray Place 2 sprays into both nostrils daily. (Patient not taking: Reported on 12/29/2015) 16 g 11  . ipratropium (ATROVENT) 0.06 % nasal spray Place 2 sprays into the nose 4 (four) times daily. 15 mL 11   Facility-Administered Medications Prior to Visit  Medication Dose Route Frequency Provider Last Rate Last Dose  . methylPREDNISolone acetate (DEPO-MEDROL) injection 80 mg  80 mg Intra-articular Once Douglas Plotnikov V, MD      . methylPREDNISolone acetate (DEPO-MEDROL) injection 80 mg  80 mg Intra-articular Once Douglas Plotnikov V, MD        ROS Review of Systems  Constitutional: Negative for appetite change, fatigue and unexpected weight change.  HENT: Negative for congestion, nosebleeds, sneezing, sore throat and trouble swallowing.   Eyes: Negative for itching and visual disturbance.  Respiratory: Negative for cough.   Cardiovascular: Negative for chest pain, palpitations and leg swelling.  Gastrointestinal: Negative for nausea, diarrhea, blood in stool and abdominal distention.  Genitourinary: Negative for frequency and hematuria.  Musculoskeletal: Positive for back pain, arthralgias and gait problem. Negative for  myalgias, joint swelling and neck pain.  Skin: Negative for rash.  Neurological: Positive for dizziness and weakness. Negative for tremors and speech difficulty.  Hematological: Does not bruise/bleed easily.  Psychiatric/Behavioral: Positive for behavioral problems, confusion and decreased concentration. Negative for suicidal ideas, hallucinations, sleep disturbance, self-injury, dysphoric mood and agitation. The patient is not nervous/anxious and is not hyperactive.     Objective:  BP 110/54 mmHg  Pulse 56  Wt 167 lb (75.751 kg)  SpO2 96%  BP Readings  from Last 3 Encounters:  12/29/15 110/54  12/16/15 170/82  11/16/15 130/70    Wt Readings from Last 3 Encounters:  12/29/15 167 lb (75.751 kg)  12/16/15 163 lb (73.936 kg)  11/16/15 163 lb (73.936 kg)    Physical Exam  Constitutional: He is oriented to person, place, and time. He appears well-developed. No distress.  NAD  HENT:  Mouth/Throat: Oropharynx is clear and moist.  Eyes: Conjunctivae are normal. Pupils are equal, round, and reactive to light.  Neck: Normal range of motion. No JVD present. No thyromegaly present.  Cardiovascular: Normal rate, regular rhythm, normal heart sounds and intact distal pulses.  Exam reveals no gallop and no friction rub.   No murmur heard. Pulmonary/Chest: Effort normal and breath sounds normal. No respiratory distress. He has no wheezes. He has no rales. He exhibits no tenderness.  Abdominal: Soft. Bowel sounds are normal. He exhibits no distension and no mass. There is no tenderness. There is no rebound and no guarding.  Musculoskeletal: Normal range of motion. He exhibits no edema or tenderness.  Lymphadenopathy:    He has no cervical adenopathy.  Neurological: He is alert and oriented to person, place, and time. He has normal reflexes. No cranial nerve deficit. He exhibits normal muscle tone. He displays a negative Romberg sign. Coordination abnormal. Gait normal.  Skin: Skin is warm and dry. No rash noted.  Psychiatric: He has a normal mood and affect.  In a w/c Confused, disoriented  Lab Results  Component Value Date   WBC 6.6 09/21/2015   HGB 13.7 09/21/2015   HCT 41.2 09/21/2015   PLT 146.0* 09/21/2015   GLUCOSE 74 09/21/2015   CHOL 168 04/18/2013   TRIG 197.0* 04/18/2013   HDL 34.00* 04/18/2013   LDLDIRECT 137.9 05/11/2010   LDLCALC 95 04/18/2013   ALT 13 09/21/2015   AST 16 09/21/2015   NA 143 09/21/2015   K 4.4 09/21/2015   CL 108 09/21/2015   CREATININE 1.16 09/21/2015   BUN 19 09/21/2015   CO2 30 09/21/2015   TSH 2.11  11/13/2014   PSA 1.61 10/18/2012   INR 1.9 12/20/2015   HGBA1C 5.8 11/13/2014    Ct Head Wo Contrast  12/20/2015   [facility] [address], [place], [place] [address] [phone] NEUROIMAGING REPORT STUDY DATE: 12/20/2015 PATIENT NAME: Douglas [name] DOB: 11/20/1935 MRN: [id number] EXAM: CT of the head without contrast ORDERING CLINICIAN: Star Age, MD, PhD CLINICAL HISTORY: 80 year old man with a bony prominence in the right posterior skull and dementia COMPARISON FILMS: CT 10/08/2011, MRI 12/12/2014 TECHNIQUE: CT scan of the head was obtained utilizing 5 mm axial slices from the skull base to the vertex. CONTRAST: None IMAGING SITE: [facility],  [address] FINDINGS: The third and lateral ventricles are enlarged, in proportion to the extent of moderate cortical atrophy. There are hypodense signal changes in the deep and periventricular white matter bilaterally consistent with advanced chronic microvascular ischemic change..  No extra-axial fluid collections  are seen.  No intracranial hemorrhage.  No evidence of mass effect or midline shift.  The cerebellum, brainstem and deep gray matter appears normal.  . The orbits and their contents are unremarkable.    The left frontal recess and some of the ethmoid air cells show mild mucoperiosteal thickening.    The posterior skull is slightly asymmetric larger on the right and there does appear to be a mild prominence of the skull base. This is unchanged when compared to the CT scan to 06/16/2012. This region has a normal appearance on MRI 12/12/2014.   Compared to the CT scan from 2013, there has been some progression of atrophy and chronic microvascular ischemic change.   12/20/2015   This CT scan of the head without contrast shows the following: 1.   Advanced cortical atrophy and advance chronic microvascular ischemic change.   There appears to be some progression of the chronic microvascular ischemic change and  atrophy when compared to the prior CT scan. 2.   There is some asymmetry of the occipital skull but no pathology is noted. 3.   Mild chronic sinusitis. INTERPRETING PHYSICIAN: Douglas A. Felecia Shelling, MD, PhD Certified in  Neuroimaging by [employer]    Assessment & Plan:   There are no diagnoses linked to this encounter. I am having Mr. Gerstenberger maintain his cholecalciferol, aspirin EC, folic acid, albuterol, loratadine, fluticasone, ipratropium, tamsulosin, omeprazole, diclofenac sodium, donepezil, memantine, escitalopram, cloNIDine, warfarin, verapamil, losartan, and erythromycin. We will continue to administer methylPREDNISolone acetate and methylPREDNISolone acetate.  No orders of the defined types were placed in this encounter.     Follow-up: No Follow-up on file.  Douglas Kehr, MD

## 2015-12-29 NOTE — Assessment & Plan Note (Signed)
chronic w/blepharitis Erythro oint

## 2015-12-29 NOTE — Assessment & Plan Note (Signed)
Douglas [name] is moving to [place] - papers filled out

## 2016-01-04 ENCOUNTER — Ambulatory Visit: Payer: Self-pay | Admitting: Internal Medicine

## 2016-01-04 DIAGNOSIS — Z5181 Encounter for therapeutic drug level monitoring: Secondary | ICD-10-CM

## 2016-01-04 DIAGNOSIS — E559 Vitamin D deficiency, unspecified: Secondary | ICD-10-CM | POA: Diagnosis not present

## 2016-01-04 DIAGNOSIS — Z8673 Personal history of transient ischemic attack (TIA), and cerebral infarction without residual deficits: Secondary | ICD-10-CM

## 2016-01-04 DIAGNOSIS — I10 Essential (primary) hypertension: Secondary | ICD-10-CM | POA: Diagnosis not present

## 2016-01-04 DIAGNOSIS — I2510 Atherosclerotic heart disease of native coronary artery without angina pectoris: Secondary | ICD-10-CM | POA: Diagnosis not present

## 2016-01-04 DIAGNOSIS — G459 Transient cerebral ischemic attack, unspecified: Secondary | ICD-10-CM

## 2016-01-04 DIAGNOSIS — N401 Enlarged prostate with lower urinary tract symptoms: Secondary | ICD-10-CM | POA: Diagnosis not present

## 2016-01-11 DIAGNOSIS — I2510 Atherosclerotic heart disease of native coronary artery without angina pectoris: Secondary | ICD-10-CM | POA: Diagnosis not present

## 2016-01-11 DIAGNOSIS — Z8673 Personal history of transient ischemic attack (TIA), and cerebral infarction without residual deficits: Secondary | ICD-10-CM | POA: Diagnosis not present

## 2016-01-12 DIAGNOSIS — I69398 Other sequelae of cerebral infarction: Secondary | ICD-10-CM | POA: Diagnosis not present

## 2016-01-12 DIAGNOSIS — I2510 Atherosclerotic heart disease of native coronary artery without angina pectoris: Secondary | ICD-10-CM | POA: Diagnosis not present

## 2016-01-12 DIAGNOSIS — N400 Enlarged prostate without lower urinary tract symptoms: Secondary | ICD-10-CM | POA: Diagnosis not present

## 2016-01-12 DIAGNOSIS — Z79899 Other long term (current) drug therapy: Secondary | ICD-10-CM | POA: Diagnosis not present

## 2016-01-12 DIAGNOSIS — G309 Alzheimer's disease, unspecified: Secondary | ICD-10-CM | POA: Diagnosis not present

## 2016-01-12 DIAGNOSIS — R2681 Unsteadiness on feet: Secondary | ICD-10-CM | POA: Diagnosis not present

## 2016-01-12 DIAGNOSIS — G4700 Insomnia, unspecified: Secondary | ICD-10-CM | POA: Diagnosis not present

## 2016-01-12 DIAGNOSIS — I10 Essential (primary) hypertension: Secondary | ICD-10-CM | POA: Diagnosis not present

## 2016-01-12 DIAGNOSIS — M6281 Muscle weakness (generalized): Secondary | ICD-10-CM | POA: Diagnosis not present

## 2016-01-12 DIAGNOSIS — I739 Peripheral vascular disease, unspecified: Secondary | ICD-10-CM | POA: Diagnosis not present

## 2016-01-12 DIAGNOSIS — Z7902 Long term (current) use of antithrombotics/antiplatelets: Secondary | ICD-10-CM | POA: Diagnosis not present

## 2016-01-12 DIAGNOSIS — M25561 Pain in right knee: Secondary | ICD-10-CM | POA: Diagnosis not present

## 2016-01-14 ENCOUNTER — Telehealth: Payer: Self-pay

## 2016-01-14 DIAGNOSIS — E119 Type 2 diabetes mellitus without complications: Secondary | ICD-10-CM | POA: Diagnosis not present

## 2016-01-14 DIAGNOSIS — E782 Mixed hyperlipidemia: Secondary | ICD-10-CM | POA: Diagnosis not present

## 2016-01-14 DIAGNOSIS — E440 Moderate protein-calorie malnutrition: Secondary | ICD-10-CM | POA: Diagnosis not present

## 2016-01-14 DIAGNOSIS — E039 Hypothyroidism, unspecified: Secondary | ICD-10-CM | POA: Diagnosis not present

## 2016-01-14 DIAGNOSIS — I10 Essential (primary) hypertension: Secondary | ICD-10-CM | POA: Diagnosis not present

## 2016-01-14 DIAGNOSIS — M6281 Muscle weakness (generalized): Secondary | ICD-10-CM | POA: Diagnosis not present

## 2016-01-14 NOTE — Telephone Encounter (Signed)
Verbal Orders needed: Home Health PT 3x a week for 3 weeks 2xa week for 2weeks.

## 2016-01-14 NOTE — Telephone Encounter (Signed)
Verbal orders given  

## 2016-01-18 DIAGNOSIS — M6281 Muscle weakness (generalized): Secondary | ICD-10-CM | POA: Diagnosis not present

## 2016-01-18 DIAGNOSIS — R2681 Unsteadiness on feet: Secondary | ICD-10-CM | POA: Diagnosis not present

## 2016-01-18 DIAGNOSIS — I739 Peripheral vascular disease, unspecified: Secondary | ICD-10-CM | POA: Diagnosis not present

## 2016-01-18 DIAGNOSIS — I69398 Other sequelae of cerebral infarction: Secondary | ICD-10-CM | POA: Diagnosis not present

## 2016-01-18 DIAGNOSIS — I2510 Atherosclerotic heart disease of native coronary artery without angina pectoris: Secondary | ICD-10-CM | POA: Diagnosis not present

## 2016-01-18 DIAGNOSIS — I10 Essential (primary) hypertension: Secondary | ICD-10-CM | POA: Diagnosis not present

## 2016-01-18 DIAGNOSIS — N400 Enlarged prostate without lower urinary tract symptoms: Secondary | ICD-10-CM | POA: Diagnosis not present

## 2016-01-18 DIAGNOSIS — Z7902 Long term (current) use of antithrombotics/antiplatelets: Secondary | ICD-10-CM | POA: Diagnosis not present

## 2016-01-18 DIAGNOSIS — Z79899 Other long term (current) drug therapy: Secondary | ICD-10-CM | POA: Diagnosis not present

## 2016-01-18 DIAGNOSIS — M25561 Pain in right knee: Secondary | ICD-10-CM | POA: Diagnosis not present

## 2016-01-18 DIAGNOSIS — G309 Alzheimer's disease, unspecified: Secondary | ICD-10-CM | POA: Diagnosis not present

## 2016-01-20 DIAGNOSIS — R2681 Unsteadiness on feet: Secondary | ICD-10-CM | POA: Diagnosis not present

## 2016-01-20 DIAGNOSIS — N400 Enlarged prostate without lower urinary tract symptoms: Secondary | ICD-10-CM | POA: Diagnosis not present

## 2016-01-20 DIAGNOSIS — Z79899 Other long term (current) drug therapy: Secondary | ICD-10-CM | POA: Diagnosis not present

## 2016-01-20 DIAGNOSIS — I2510 Atherosclerotic heart disease of native coronary artery without angina pectoris: Secondary | ICD-10-CM | POA: Diagnosis not present

## 2016-01-20 DIAGNOSIS — Z7902 Long term (current) use of antithrombotics/antiplatelets: Secondary | ICD-10-CM | POA: Diagnosis not present

## 2016-01-20 DIAGNOSIS — G309 Alzheimer's disease, unspecified: Secondary | ICD-10-CM | POA: Diagnosis not present

## 2016-01-20 DIAGNOSIS — I739 Peripheral vascular disease, unspecified: Secondary | ICD-10-CM | POA: Diagnosis not present

## 2016-01-20 DIAGNOSIS — M6281 Muscle weakness (generalized): Secondary | ICD-10-CM | POA: Diagnosis not present

## 2016-01-20 DIAGNOSIS — I69398 Other sequelae of cerebral infarction: Secondary | ICD-10-CM | POA: Diagnosis not present

## 2016-01-20 DIAGNOSIS — I10 Essential (primary) hypertension: Secondary | ICD-10-CM | POA: Diagnosis not present

## 2016-01-20 DIAGNOSIS — M25561 Pain in right knee: Secondary | ICD-10-CM | POA: Diagnosis not present

## 2016-01-21 DIAGNOSIS — I10 Essential (primary) hypertension: Secondary | ICD-10-CM | POA: Diagnosis not present

## 2016-01-21 DIAGNOSIS — Z79899 Other long term (current) drug therapy: Secondary | ICD-10-CM | POA: Diagnosis not present

## 2016-01-21 DIAGNOSIS — Z7902 Long term (current) use of antithrombotics/antiplatelets: Secondary | ICD-10-CM | POA: Diagnosis not present

## 2016-01-21 DIAGNOSIS — N400 Enlarged prostate without lower urinary tract symptoms: Secondary | ICD-10-CM | POA: Diagnosis not present

## 2016-01-21 DIAGNOSIS — I739 Peripheral vascular disease, unspecified: Secondary | ICD-10-CM | POA: Diagnosis not present

## 2016-01-21 DIAGNOSIS — M25561 Pain in right knee: Secondary | ICD-10-CM | POA: Diagnosis not present

## 2016-01-21 DIAGNOSIS — R2681 Unsteadiness on feet: Secondary | ICD-10-CM | POA: Diagnosis not present

## 2016-01-21 DIAGNOSIS — I69398 Other sequelae of cerebral infarction: Secondary | ICD-10-CM | POA: Diagnosis not present

## 2016-01-21 DIAGNOSIS — I2510 Atherosclerotic heart disease of native coronary artery without angina pectoris: Secondary | ICD-10-CM | POA: Diagnosis not present

## 2016-01-21 DIAGNOSIS — G309 Alzheimer's disease, unspecified: Secondary | ICD-10-CM | POA: Diagnosis not present

## 2016-01-21 DIAGNOSIS — M6281 Muscle weakness (generalized): Secondary | ICD-10-CM | POA: Diagnosis not present

## 2016-01-25 DIAGNOSIS — G309 Alzheimer's disease, unspecified: Secondary | ICD-10-CM | POA: Diagnosis not present

## 2016-01-25 DIAGNOSIS — I69398 Other sequelae of cerebral infarction: Secondary | ICD-10-CM | POA: Diagnosis not present

## 2016-01-25 DIAGNOSIS — M6281 Muscle weakness (generalized): Secondary | ICD-10-CM | POA: Diagnosis not present

## 2016-01-25 DIAGNOSIS — Z7902 Long term (current) use of antithrombotics/antiplatelets: Secondary | ICD-10-CM | POA: Diagnosis not present

## 2016-01-25 DIAGNOSIS — I10 Essential (primary) hypertension: Secondary | ICD-10-CM | POA: Diagnosis not present

## 2016-01-25 DIAGNOSIS — N400 Enlarged prostate without lower urinary tract symptoms: Secondary | ICD-10-CM | POA: Diagnosis not present

## 2016-01-25 DIAGNOSIS — R2681 Unsteadiness on feet: Secondary | ICD-10-CM | POA: Diagnosis not present

## 2016-01-25 DIAGNOSIS — M25561 Pain in right knee: Secondary | ICD-10-CM | POA: Diagnosis not present

## 2016-01-25 DIAGNOSIS — Z79899 Other long term (current) drug therapy: Secondary | ICD-10-CM | POA: Diagnosis not present

## 2016-01-25 DIAGNOSIS — Z8673 Personal history of transient ischemic attack (TIA), and cerebral infarction without residual deficits: Secondary | ICD-10-CM | POA: Diagnosis not present

## 2016-01-25 DIAGNOSIS — I2510 Atherosclerotic heart disease of native coronary artery without angina pectoris: Secondary | ICD-10-CM | POA: Diagnosis not present

## 2016-01-25 DIAGNOSIS — I739 Peripheral vascular disease, unspecified: Secondary | ICD-10-CM | POA: Diagnosis not present

## 2016-01-26 DIAGNOSIS — R2681 Unsteadiness on feet: Secondary | ICD-10-CM | POA: Diagnosis not present

## 2016-01-26 DIAGNOSIS — G309 Alzheimer's disease, unspecified: Secondary | ICD-10-CM | POA: Diagnosis not present

## 2016-01-26 DIAGNOSIS — I2510 Atherosclerotic heart disease of native coronary artery without angina pectoris: Secondary | ICD-10-CM | POA: Diagnosis not present

## 2016-01-26 DIAGNOSIS — M25561 Pain in right knee: Secondary | ICD-10-CM | POA: Diagnosis not present

## 2016-01-26 DIAGNOSIS — I739 Peripheral vascular disease, unspecified: Secondary | ICD-10-CM | POA: Diagnosis not present

## 2016-01-26 DIAGNOSIS — Z7902 Long term (current) use of antithrombotics/antiplatelets: Secondary | ICD-10-CM | POA: Diagnosis not present

## 2016-01-26 DIAGNOSIS — I10 Essential (primary) hypertension: Secondary | ICD-10-CM | POA: Diagnosis not present

## 2016-01-26 DIAGNOSIS — Z79899 Other long term (current) drug therapy: Secondary | ICD-10-CM | POA: Diagnosis not present

## 2016-01-26 DIAGNOSIS — M6281 Muscle weakness (generalized): Secondary | ICD-10-CM | POA: Diagnosis not present

## 2016-01-26 DIAGNOSIS — G4700 Insomnia, unspecified: Secondary | ICD-10-CM | POA: Diagnosis not present

## 2016-01-26 DIAGNOSIS — N400 Enlarged prostate without lower urinary tract symptoms: Secondary | ICD-10-CM | POA: Diagnosis not present

## 2016-01-26 DIAGNOSIS — I69398 Other sequelae of cerebral infarction: Secondary | ICD-10-CM | POA: Diagnosis not present

## 2016-01-28 DIAGNOSIS — Z7902 Long term (current) use of antithrombotics/antiplatelets: Secondary | ICD-10-CM | POA: Diagnosis not present

## 2016-01-28 DIAGNOSIS — I10 Essential (primary) hypertension: Secondary | ICD-10-CM | POA: Diagnosis not present

## 2016-01-28 DIAGNOSIS — M25561 Pain in right knee: Secondary | ICD-10-CM | POA: Diagnosis not present

## 2016-01-28 DIAGNOSIS — I69398 Other sequelae of cerebral infarction: Secondary | ICD-10-CM | POA: Diagnosis not present

## 2016-01-28 DIAGNOSIS — Z888 Allergy status to other drugs, medicaments and biological substances status: Secondary | ICD-10-CM | POA: Diagnosis not present

## 2016-01-28 DIAGNOSIS — I639 Cerebral infarction, unspecified: Secondary | ICD-10-CM | POA: Diagnosis not present

## 2016-01-28 DIAGNOSIS — G930 Cerebral cysts: Secondary | ICD-10-CM | POA: Diagnosis not present

## 2016-01-28 DIAGNOSIS — R739 Hyperglycemia, unspecified: Secondary | ICD-10-CM | POA: Diagnosis not present

## 2016-01-28 DIAGNOSIS — I517 Cardiomegaly: Secondary | ICD-10-CM | POA: Diagnosis not present

## 2016-01-28 DIAGNOSIS — E041 Nontoxic single thyroid nodule: Secondary | ICD-10-CM | POA: Diagnosis not present

## 2016-01-28 DIAGNOSIS — E876 Hypokalemia: Secondary | ICD-10-CM | POA: Diagnosis not present

## 2016-01-28 DIAGNOSIS — G9389 Other specified disorders of brain: Secondary | ICD-10-CM | POA: Diagnosis not present

## 2016-01-28 DIAGNOSIS — N400 Enlarged prostate without lower urinary tract symptoms: Secondary | ICD-10-CM | POA: Diagnosis not present

## 2016-01-28 DIAGNOSIS — E785 Hyperlipidemia, unspecified: Secondary | ICD-10-CM | POA: Diagnosis not present

## 2016-01-28 DIAGNOSIS — M6281 Muscle weakness (generalized): Secondary | ICD-10-CM | POA: Diagnosis not present

## 2016-01-28 DIAGNOSIS — Z79899 Other long term (current) drug therapy: Secondary | ICD-10-CM | POA: Diagnosis not present

## 2016-01-28 DIAGNOSIS — I2510 Atherosclerotic heart disease of native coronary artery without angina pectoris: Secondary | ICD-10-CM | POA: Diagnosis not present

## 2016-01-28 DIAGNOSIS — D6862 Lupus anticoagulant syndrome: Secondary | ICD-10-CM | POA: Diagnosis not present

## 2016-01-28 DIAGNOSIS — I080 Rheumatic disorders of both mitral and aortic valves: Secondary | ICD-10-CM | POA: Diagnosis not present

## 2016-01-28 DIAGNOSIS — Z87891 Personal history of nicotine dependence: Secondary | ICD-10-CM | POA: Diagnosis not present

## 2016-01-28 DIAGNOSIS — Z8673 Personal history of transient ischemic attack (TIA), and cerebral infarction without residual deficits: Secondary | ICD-10-CM | POA: Diagnosis not present

## 2016-01-28 DIAGNOSIS — I63231 Cerebral infarction due to unspecified occlusion or stenosis of right carotid arteries: Secondary | ICD-10-CM | POA: Diagnosis not present

## 2016-01-28 DIAGNOSIS — G309 Alzheimer's disease, unspecified: Secondary | ICD-10-CM | POA: Diagnosis not present

## 2016-01-28 DIAGNOSIS — Z7982 Long term (current) use of aspirin: Secondary | ICD-10-CM | POA: Diagnosis not present

## 2016-01-28 DIAGNOSIS — R2681 Unsteadiness on feet: Secondary | ICD-10-CM | POA: Diagnosis not present

## 2016-01-28 DIAGNOSIS — R531 Weakness: Secondary | ICD-10-CM | POA: Diagnosis not present

## 2016-01-28 DIAGNOSIS — Z9114 Patient's other noncompliance with medication regimen: Secondary | ICD-10-CM | POA: Diagnosis not present

## 2016-01-28 DIAGNOSIS — Z885 Allergy status to narcotic agent status: Secondary | ICD-10-CM | POA: Diagnosis not present

## 2016-01-28 DIAGNOSIS — I739 Peripheral vascular disease, unspecified: Secondary | ICD-10-CM | POA: Diagnosis not present

## 2016-02-01 DIAGNOSIS — I69398 Other sequelae of cerebral infarction: Secondary | ICD-10-CM | POA: Diagnosis not present

## 2016-02-01 DIAGNOSIS — M25561 Pain in right knee: Secondary | ICD-10-CM | POA: Diagnosis not present

## 2016-02-01 DIAGNOSIS — Z79899 Other long term (current) drug therapy: Secondary | ICD-10-CM | POA: Diagnosis not present

## 2016-02-01 DIAGNOSIS — Z8673 Personal history of transient ischemic attack (TIA), and cerebral infarction without residual deficits: Secondary | ICD-10-CM | POA: Diagnosis not present

## 2016-02-01 DIAGNOSIS — N400 Enlarged prostate without lower urinary tract symptoms: Secondary | ICD-10-CM | POA: Diagnosis not present

## 2016-02-01 DIAGNOSIS — M6281 Muscle weakness (generalized): Secondary | ICD-10-CM | POA: Diagnosis not present

## 2016-02-01 DIAGNOSIS — I739 Peripheral vascular disease, unspecified: Secondary | ICD-10-CM | POA: Diagnosis not present

## 2016-02-01 DIAGNOSIS — I10 Essential (primary) hypertension: Secondary | ICD-10-CM | POA: Diagnosis not present

## 2016-02-01 DIAGNOSIS — R2681 Unsteadiness on feet: Secondary | ICD-10-CM | POA: Diagnosis not present

## 2016-02-01 DIAGNOSIS — Z7902 Long term (current) use of antithrombotics/antiplatelets: Secondary | ICD-10-CM | POA: Diagnosis not present

## 2016-02-01 DIAGNOSIS — G309 Alzheimer's disease, unspecified: Secondary | ICD-10-CM | POA: Diagnosis not present

## 2016-02-01 DIAGNOSIS — I639 Cerebral infarction, unspecified: Secondary | ICD-10-CM | POA: Diagnosis not present

## 2016-02-01 DIAGNOSIS — N401 Enlarged prostate with lower urinary tract symptoms: Secondary | ICD-10-CM | POA: Diagnosis not present

## 2016-02-01 DIAGNOSIS — I2510 Atherosclerotic heart disease of native coronary artery without angina pectoris: Secondary | ICD-10-CM | POA: Diagnosis not present

## 2016-02-02 DIAGNOSIS — Z79899 Other long term (current) drug therapy: Secondary | ICD-10-CM | POA: Diagnosis not present

## 2016-02-02 DIAGNOSIS — I739 Peripheral vascular disease, unspecified: Secondary | ICD-10-CM | POA: Diagnosis not present

## 2016-02-02 DIAGNOSIS — I2510 Atherosclerotic heart disease of native coronary artery without angina pectoris: Secondary | ICD-10-CM | POA: Diagnosis not present

## 2016-02-02 DIAGNOSIS — I69398 Other sequelae of cerebral infarction: Secondary | ICD-10-CM | POA: Diagnosis not present

## 2016-02-02 DIAGNOSIS — G309 Alzheimer's disease, unspecified: Secondary | ICD-10-CM | POA: Diagnosis not present

## 2016-02-02 DIAGNOSIS — I10 Essential (primary) hypertension: Secondary | ICD-10-CM | POA: Diagnosis not present

## 2016-02-02 DIAGNOSIS — M6281 Muscle weakness (generalized): Secondary | ICD-10-CM | POA: Diagnosis not present

## 2016-02-02 DIAGNOSIS — R2681 Unsteadiness on feet: Secondary | ICD-10-CM | POA: Diagnosis not present

## 2016-02-02 DIAGNOSIS — Z7902 Long term (current) use of antithrombotics/antiplatelets: Secondary | ICD-10-CM | POA: Diagnosis not present

## 2016-02-02 DIAGNOSIS — N400 Enlarged prostate without lower urinary tract symptoms: Secondary | ICD-10-CM | POA: Diagnosis not present

## 2016-02-02 DIAGNOSIS — M25561 Pain in right knee: Secondary | ICD-10-CM | POA: Diagnosis not present

## 2016-02-04 DIAGNOSIS — I2510 Atherosclerotic heart disease of native coronary artery without angina pectoris: Secondary | ICD-10-CM | POA: Diagnosis not present

## 2016-02-04 DIAGNOSIS — I739 Peripheral vascular disease, unspecified: Secondary | ICD-10-CM | POA: Diagnosis not present

## 2016-02-04 DIAGNOSIS — R2681 Unsteadiness on feet: Secondary | ICD-10-CM | POA: Diagnosis not present

## 2016-02-04 DIAGNOSIS — I69398 Other sequelae of cerebral infarction: Secondary | ICD-10-CM | POA: Diagnosis not present

## 2016-02-04 DIAGNOSIS — N400 Enlarged prostate without lower urinary tract symptoms: Secondary | ICD-10-CM | POA: Diagnosis not present

## 2016-02-04 DIAGNOSIS — Z79899 Other long term (current) drug therapy: Secondary | ICD-10-CM | POA: Diagnosis not present

## 2016-02-04 DIAGNOSIS — M6281 Muscle weakness (generalized): Secondary | ICD-10-CM | POA: Diagnosis not present

## 2016-02-04 DIAGNOSIS — I10 Essential (primary) hypertension: Secondary | ICD-10-CM | POA: Diagnosis not present

## 2016-02-04 DIAGNOSIS — Z7902 Long term (current) use of antithrombotics/antiplatelets: Secondary | ICD-10-CM | POA: Diagnosis not present

## 2016-02-04 DIAGNOSIS — M25561 Pain in right knee: Secondary | ICD-10-CM | POA: Diagnosis not present

## 2016-02-04 DIAGNOSIS — G309 Alzheimer's disease, unspecified: Secondary | ICD-10-CM | POA: Diagnosis not present

## 2016-02-07 DIAGNOSIS — G309 Alzheimer's disease, unspecified: Secondary | ICD-10-CM | POA: Diagnosis not present

## 2016-02-07 DIAGNOSIS — I739 Peripheral vascular disease, unspecified: Secondary | ICD-10-CM | POA: Diagnosis not present

## 2016-02-07 DIAGNOSIS — I10 Essential (primary) hypertension: Secondary | ICD-10-CM | POA: Diagnosis not present

## 2016-02-07 DIAGNOSIS — N400 Enlarged prostate without lower urinary tract symptoms: Secondary | ICD-10-CM | POA: Diagnosis not present

## 2016-02-07 DIAGNOSIS — I69398 Other sequelae of cerebral infarction: Secondary | ICD-10-CM | POA: Diagnosis not present

## 2016-02-07 DIAGNOSIS — M25561 Pain in right knee: Secondary | ICD-10-CM | POA: Diagnosis not present

## 2016-02-07 DIAGNOSIS — M6281 Muscle weakness (generalized): Secondary | ICD-10-CM | POA: Diagnosis not present

## 2016-02-07 DIAGNOSIS — I2510 Atherosclerotic heart disease of native coronary artery without angina pectoris: Secondary | ICD-10-CM | POA: Diagnosis not present

## 2016-02-07 DIAGNOSIS — R2681 Unsteadiness on feet: Secondary | ICD-10-CM | POA: Diagnosis not present

## 2016-02-07 DIAGNOSIS — Z79899 Other long term (current) drug therapy: Secondary | ICD-10-CM | POA: Diagnosis not present

## 2016-02-07 DIAGNOSIS — Z7902 Long term (current) use of antithrombotics/antiplatelets: Secondary | ICD-10-CM | POA: Diagnosis not present

## 2016-02-09 DIAGNOSIS — Z79899 Other long term (current) drug therapy: Secondary | ICD-10-CM | POA: Diagnosis not present

## 2016-02-09 DIAGNOSIS — G309 Alzheimer's disease, unspecified: Secondary | ICD-10-CM | POA: Diagnosis not present

## 2016-02-09 DIAGNOSIS — I69398 Other sequelae of cerebral infarction: Secondary | ICD-10-CM | POA: Diagnosis not present

## 2016-02-09 DIAGNOSIS — I10 Essential (primary) hypertension: Secondary | ICD-10-CM | POA: Diagnosis not present

## 2016-02-09 DIAGNOSIS — M6281 Muscle weakness (generalized): Secondary | ICD-10-CM | POA: Diagnosis not present

## 2016-02-09 DIAGNOSIS — I2510 Atherosclerotic heart disease of native coronary artery without angina pectoris: Secondary | ICD-10-CM | POA: Diagnosis not present

## 2016-02-09 DIAGNOSIS — M25561 Pain in right knee: Secondary | ICD-10-CM | POA: Diagnosis not present

## 2016-02-09 DIAGNOSIS — N400 Enlarged prostate without lower urinary tract symptoms: Secondary | ICD-10-CM | POA: Diagnosis not present

## 2016-02-09 DIAGNOSIS — Z7902 Long term (current) use of antithrombotics/antiplatelets: Secondary | ICD-10-CM | POA: Diagnosis not present

## 2016-02-09 DIAGNOSIS — R2681 Unsteadiness on feet: Secondary | ICD-10-CM | POA: Diagnosis not present

## 2016-02-09 DIAGNOSIS — I739 Peripheral vascular disease, unspecified: Secondary | ICD-10-CM | POA: Diagnosis not present

## 2016-02-11 DIAGNOSIS — I69398 Other sequelae of cerebral infarction: Secondary | ICD-10-CM | POA: Diagnosis not present

## 2016-02-11 DIAGNOSIS — Z7902 Long term (current) use of antithrombotics/antiplatelets: Secondary | ICD-10-CM | POA: Diagnosis not present

## 2016-02-11 DIAGNOSIS — I739 Peripheral vascular disease, unspecified: Secondary | ICD-10-CM | POA: Diagnosis not present

## 2016-02-11 DIAGNOSIS — G309 Alzheimer's disease, unspecified: Secondary | ICD-10-CM | POA: Diagnosis not present

## 2016-02-11 DIAGNOSIS — N400 Enlarged prostate without lower urinary tract symptoms: Secondary | ICD-10-CM | POA: Diagnosis not present

## 2016-02-11 DIAGNOSIS — Z79899 Other long term (current) drug therapy: Secondary | ICD-10-CM | POA: Diagnosis not present

## 2016-02-11 DIAGNOSIS — I10 Essential (primary) hypertension: Secondary | ICD-10-CM | POA: Diagnosis not present

## 2016-02-11 DIAGNOSIS — M6281 Muscle weakness (generalized): Secondary | ICD-10-CM | POA: Diagnosis not present

## 2016-02-11 DIAGNOSIS — R2681 Unsteadiness on feet: Secondary | ICD-10-CM | POA: Diagnosis not present

## 2016-02-11 DIAGNOSIS — M25561 Pain in right knee: Secondary | ICD-10-CM | POA: Diagnosis not present

## 2016-02-11 DIAGNOSIS — I2510 Atherosclerotic heart disease of native coronary artery without angina pectoris: Secondary | ICD-10-CM | POA: Diagnosis not present

## 2016-02-14 DIAGNOSIS — I69398 Other sequelae of cerebral infarction: Secondary | ICD-10-CM | POA: Diagnosis not present

## 2016-02-14 DIAGNOSIS — I10 Essential (primary) hypertension: Secondary | ICD-10-CM | POA: Diagnosis not present

## 2016-02-14 DIAGNOSIS — I2510 Atherosclerotic heart disease of native coronary artery without angina pectoris: Secondary | ICD-10-CM | POA: Diagnosis not present

## 2016-02-14 DIAGNOSIS — R2681 Unsteadiness on feet: Secondary | ICD-10-CM | POA: Diagnosis not present

## 2016-02-14 DIAGNOSIS — G309 Alzheimer's disease, unspecified: Secondary | ICD-10-CM | POA: Diagnosis not present

## 2016-02-14 DIAGNOSIS — Z7902 Long term (current) use of antithrombotics/antiplatelets: Secondary | ICD-10-CM | POA: Diagnosis not present

## 2016-02-14 DIAGNOSIS — M25561 Pain in right knee: Secondary | ICD-10-CM | POA: Diagnosis not present

## 2016-02-14 DIAGNOSIS — I739 Peripheral vascular disease, unspecified: Secondary | ICD-10-CM | POA: Diagnosis not present

## 2016-02-14 DIAGNOSIS — Z79899 Other long term (current) drug therapy: Secondary | ICD-10-CM | POA: Diagnosis not present

## 2016-02-14 DIAGNOSIS — M6281 Muscle weakness (generalized): Secondary | ICD-10-CM | POA: Diagnosis not present

## 2016-02-14 DIAGNOSIS — N400 Enlarged prostate without lower urinary tract symptoms: Secondary | ICD-10-CM | POA: Diagnosis not present

## 2016-02-15 DIAGNOSIS — Z7902 Long term (current) use of antithrombotics/antiplatelets: Secondary | ICD-10-CM | POA: Diagnosis not present

## 2016-02-15 DIAGNOSIS — I2510 Atherosclerotic heart disease of native coronary artery without angina pectoris: Secondary | ICD-10-CM | POA: Diagnosis not present

## 2016-02-15 DIAGNOSIS — Z79899 Other long term (current) drug therapy: Secondary | ICD-10-CM | POA: Diagnosis not present

## 2016-02-15 DIAGNOSIS — I69398 Other sequelae of cerebral infarction: Secondary | ICD-10-CM | POA: Diagnosis not present

## 2016-02-15 DIAGNOSIS — G309 Alzheimer's disease, unspecified: Secondary | ICD-10-CM | POA: Diagnosis not present

## 2016-02-15 DIAGNOSIS — N400 Enlarged prostate without lower urinary tract symptoms: Secondary | ICD-10-CM | POA: Diagnosis not present

## 2016-02-15 DIAGNOSIS — I739 Peripheral vascular disease, unspecified: Secondary | ICD-10-CM | POA: Diagnosis not present

## 2016-02-15 DIAGNOSIS — R2681 Unsteadiness on feet: Secondary | ICD-10-CM | POA: Diagnosis not present

## 2016-02-15 DIAGNOSIS — I10 Essential (primary) hypertension: Secondary | ICD-10-CM | POA: Diagnosis not present

## 2016-02-15 DIAGNOSIS — M25561 Pain in right knee: Secondary | ICD-10-CM | POA: Diagnosis not present

## 2016-02-15 DIAGNOSIS — M6281 Muscle weakness (generalized): Secondary | ICD-10-CM | POA: Diagnosis not present

## 2016-02-16 DIAGNOSIS — G309 Alzheimer's disease, unspecified: Secondary | ICD-10-CM | POA: Diagnosis not present

## 2016-02-16 DIAGNOSIS — I10 Essential (primary) hypertension: Secondary | ICD-10-CM | POA: Diagnosis not present

## 2016-02-16 DIAGNOSIS — I2510 Atherosclerotic heart disease of native coronary artery without angina pectoris: Secondary | ICD-10-CM | POA: Diagnosis not present

## 2016-02-16 DIAGNOSIS — I739 Peripheral vascular disease, unspecified: Secondary | ICD-10-CM | POA: Diagnosis not present

## 2016-02-16 DIAGNOSIS — Z7902 Long term (current) use of antithrombotics/antiplatelets: Secondary | ICD-10-CM | POA: Diagnosis not present

## 2016-02-16 DIAGNOSIS — M6281 Muscle weakness (generalized): Secondary | ICD-10-CM | POA: Diagnosis not present

## 2016-02-16 DIAGNOSIS — I69398 Other sequelae of cerebral infarction: Secondary | ICD-10-CM | POA: Diagnosis not present

## 2016-02-16 DIAGNOSIS — N400 Enlarged prostate without lower urinary tract symptoms: Secondary | ICD-10-CM | POA: Diagnosis not present

## 2016-02-16 DIAGNOSIS — Z79899 Other long term (current) drug therapy: Secondary | ICD-10-CM | POA: Diagnosis not present

## 2016-02-16 DIAGNOSIS — G4700 Insomnia, unspecified: Secondary | ICD-10-CM | POA: Diagnosis not present

## 2016-02-16 DIAGNOSIS — M25561 Pain in right knee: Secondary | ICD-10-CM | POA: Diagnosis not present

## 2016-02-16 DIAGNOSIS — R2681 Unsteadiness on feet: Secondary | ICD-10-CM | POA: Diagnosis not present

## 2016-02-18 DIAGNOSIS — M25561 Pain in right knee: Secondary | ICD-10-CM | POA: Diagnosis not present

## 2016-02-18 DIAGNOSIS — R2681 Unsteadiness on feet: Secondary | ICD-10-CM | POA: Diagnosis not present

## 2016-02-18 DIAGNOSIS — G309 Alzheimer's disease, unspecified: Secondary | ICD-10-CM | POA: Diagnosis not present

## 2016-02-18 DIAGNOSIS — R531 Weakness: Secondary | ICD-10-CM | POA: Diagnosis not present

## 2016-02-18 DIAGNOSIS — M6281 Muscle weakness (generalized): Secondary | ICD-10-CM | POA: Diagnosis not present

## 2016-02-18 DIAGNOSIS — Z79899 Other long term (current) drug therapy: Secondary | ICD-10-CM | POA: Diagnosis not present

## 2016-02-18 DIAGNOSIS — E782 Mixed hyperlipidemia: Secondary | ICD-10-CM | POA: Diagnosis not present

## 2016-02-18 DIAGNOSIS — I2510 Atherosclerotic heart disease of native coronary artery without angina pectoris: Secondary | ICD-10-CM | POA: Diagnosis not present

## 2016-02-18 DIAGNOSIS — I69398 Other sequelae of cerebral infarction: Secondary | ICD-10-CM | POA: Diagnosis not present

## 2016-02-18 DIAGNOSIS — N400 Enlarged prostate without lower urinary tract symptoms: Secondary | ICD-10-CM | POA: Diagnosis not present

## 2016-02-18 DIAGNOSIS — I10 Essential (primary) hypertension: Secondary | ICD-10-CM | POA: Diagnosis not present

## 2016-02-18 DIAGNOSIS — Z7902 Long term (current) use of antithrombotics/antiplatelets: Secondary | ICD-10-CM | POA: Diagnosis not present

## 2016-02-18 DIAGNOSIS — I739 Peripheral vascular disease, unspecified: Secondary | ICD-10-CM | POA: Diagnosis not present

## 2016-02-18 DIAGNOSIS — E039 Hypothyroidism, unspecified: Secondary | ICD-10-CM | POA: Diagnosis not present

## 2016-02-21 ENCOUNTER — Telehealth: Payer: Self-pay | Admitting: Internal Medicine

## 2016-02-21 DIAGNOSIS — M6281 Muscle weakness (generalized): Secondary | ICD-10-CM | POA: Diagnosis not present

## 2016-02-21 DIAGNOSIS — I739 Peripheral vascular disease, unspecified: Secondary | ICD-10-CM | POA: Diagnosis not present

## 2016-02-21 DIAGNOSIS — M25561 Pain in right knee: Secondary | ICD-10-CM | POA: Diagnosis not present

## 2016-02-21 DIAGNOSIS — I69398 Other sequelae of cerebral infarction: Secondary | ICD-10-CM | POA: Diagnosis not present

## 2016-02-21 DIAGNOSIS — G309 Alzheimer's disease, unspecified: Secondary | ICD-10-CM | POA: Diagnosis not present

## 2016-02-21 DIAGNOSIS — I2510 Atherosclerotic heart disease of native coronary artery without angina pectoris: Secondary | ICD-10-CM | POA: Diagnosis not present

## 2016-02-21 DIAGNOSIS — N400 Enlarged prostate without lower urinary tract symptoms: Secondary | ICD-10-CM | POA: Diagnosis not present

## 2016-02-21 DIAGNOSIS — R2681 Unsteadiness on feet: Secondary | ICD-10-CM | POA: Diagnosis not present

## 2016-02-21 DIAGNOSIS — Z7902 Long term (current) use of antithrombotics/antiplatelets: Secondary | ICD-10-CM | POA: Diagnosis not present

## 2016-02-21 DIAGNOSIS — I10 Essential (primary) hypertension: Secondary | ICD-10-CM | POA: Diagnosis not present

## 2016-02-21 DIAGNOSIS — Z79899 Other long term (current) drug therapy: Secondary | ICD-10-CM | POA: Diagnosis not present

## 2016-02-21 NOTE — Telephone Encounter (Signed)
Notified Flora w/MD response...Douglas [name]

## 2016-02-21 NOTE — Telephone Encounter (Signed)
Ok Thx 

## 2016-02-21 NOTE — Telephone Encounter (Signed)
Request extension of PT visits for anther 3 weeks.

## 2016-02-23 DIAGNOSIS — M6281 Muscle weakness (generalized): Secondary | ICD-10-CM | POA: Diagnosis not present

## 2016-02-23 DIAGNOSIS — M25561 Pain in right knee: Secondary | ICD-10-CM | POA: Diagnosis not present

## 2016-02-23 DIAGNOSIS — G309 Alzheimer's disease, unspecified: Secondary | ICD-10-CM | POA: Diagnosis not present

## 2016-02-23 DIAGNOSIS — I69398 Other sequelae of cerebral infarction: Secondary | ICD-10-CM | POA: Diagnosis not present

## 2016-02-23 DIAGNOSIS — I739 Peripheral vascular disease, unspecified: Secondary | ICD-10-CM | POA: Diagnosis not present

## 2016-02-23 DIAGNOSIS — I2510 Atherosclerotic heart disease of native coronary artery without angina pectoris: Secondary | ICD-10-CM | POA: Diagnosis not present

## 2016-02-23 DIAGNOSIS — N400 Enlarged prostate without lower urinary tract symptoms: Secondary | ICD-10-CM | POA: Diagnosis not present

## 2016-02-23 DIAGNOSIS — R2681 Unsteadiness on feet: Secondary | ICD-10-CM | POA: Diagnosis not present

## 2016-02-23 DIAGNOSIS — Z7902 Long term (current) use of antithrombotics/antiplatelets: Secondary | ICD-10-CM | POA: Diagnosis not present

## 2016-02-23 DIAGNOSIS — I10 Essential (primary) hypertension: Secondary | ICD-10-CM | POA: Diagnosis not present

## 2016-02-23 DIAGNOSIS — Z79899 Other long term (current) drug therapy: Secondary | ICD-10-CM | POA: Diagnosis not present

## 2016-02-25 DIAGNOSIS — I69398 Other sequelae of cerebral infarction: Secondary | ICD-10-CM | POA: Diagnosis not present

## 2016-02-25 DIAGNOSIS — I2510 Atherosclerotic heart disease of native coronary artery without angina pectoris: Secondary | ICD-10-CM | POA: Diagnosis not present

## 2016-02-25 DIAGNOSIS — M25561 Pain in right knee: Secondary | ICD-10-CM | POA: Diagnosis not present

## 2016-02-25 DIAGNOSIS — Z79899 Other long term (current) drug therapy: Secondary | ICD-10-CM | POA: Diagnosis not present

## 2016-02-25 DIAGNOSIS — R2681 Unsteadiness on feet: Secondary | ICD-10-CM | POA: Diagnosis not present

## 2016-02-25 DIAGNOSIS — Z7902 Long term (current) use of antithrombotics/antiplatelets: Secondary | ICD-10-CM | POA: Diagnosis not present

## 2016-02-25 DIAGNOSIS — G309 Alzheimer's disease, unspecified: Secondary | ICD-10-CM | POA: Diagnosis not present

## 2016-02-25 DIAGNOSIS — I739 Peripheral vascular disease, unspecified: Secondary | ICD-10-CM | POA: Diagnosis not present

## 2016-02-25 DIAGNOSIS — I10 Essential (primary) hypertension: Secondary | ICD-10-CM | POA: Diagnosis not present

## 2016-02-25 DIAGNOSIS — M6281 Muscle weakness (generalized): Secondary | ICD-10-CM | POA: Diagnosis not present

## 2016-02-25 DIAGNOSIS — G8929 Other chronic pain: Secondary | ICD-10-CM | POA: Diagnosis not present

## 2016-02-25 DIAGNOSIS — N400 Enlarged prostate without lower urinary tract symptoms: Secondary | ICD-10-CM | POA: Diagnosis not present

## 2016-02-25 DIAGNOSIS — E559 Vitamin D deficiency, unspecified: Secondary | ICD-10-CM | POA: Diagnosis not present

## 2016-02-28 DIAGNOSIS — G309 Alzheimer's disease, unspecified: Secondary | ICD-10-CM | POA: Diagnosis not present

## 2016-02-28 DIAGNOSIS — I739 Peripheral vascular disease, unspecified: Secondary | ICD-10-CM | POA: Diagnosis not present

## 2016-02-28 DIAGNOSIS — I69398 Other sequelae of cerebral infarction: Secondary | ICD-10-CM | POA: Diagnosis not present

## 2016-02-28 DIAGNOSIS — M6281 Muscle weakness (generalized): Secondary | ICD-10-CM | POA: Diagnosis not present

## 2016-02-28 DIAGNOSIS — Z79899 Other long term (current) drug therapy: Secondary | ICD-10-CM | POA: Diagnosis not present

## 2016-02-28 DIAGNOSIS — N400 Enlarged prostate without lower urinary tract symptoms: Secondary | ICD-10-CM | POA: Diagnosis not present

## 2016-02-28 DIAGNOSIS — Z7902 Long term (current) use of antithrombotics/antiplatelets: Secondary | ICD-10-CM | POA: Diagnosis not present

## 2016-02-28 DIAGNOSIS — R2681 Unsteadiness on feet: Secondary | ICD-10-CM | POA: Diagnosis not present

## 2016-02-28 DIAGNOSIS — M25561 Pain in right knee: Secondary | ICD-10-CM | POA: Diagnosis not present

## 2016-02-28 DIAGNOSIS — I10 Essential (primary) hypertension: Secondary | ICD-10-CM | POA: Diagnosis not present

## 2016-02-28 DIAGNOSIS — I2510 Atherosclerotic heart disease of native coronary artery without angina pectoris: Secondary | ICD-10-CM | POA: Diagnosis not present

## 2016-03-01 DIAGNOSIS — R2681 Unsteadiness on feet: Secondary | ICD-10-CM | POA: Diagnosis not present

## 2016-03-01 DIAGNOSIS — I69398 Other sequelae of cerebral infarction: Secondary | ICD-10-CM | POA: Diagnosis not present

## 2016-03-01 DIAGNOSIS — I10 Essential (primary) hypertension: Secondary | ICD-10-CM | POA: Diagnosis not present

## 2016-03-01 DIAGNOSIS — M6281 Muscle weakness (generalized): Secondary | ICD-10-CM | POA: Diagnosis not present

## 2016-03-01 DIAGNOSIS — M25561 Pain in right knee: Secondary | ICD-10-CM | POA: Diagnosis not present

## 2016-03-01 DIAGNOSIS — I739 Peripheral vascular disease, unspecified: Secondary | ICD-10-CM | POA: Diagnosis not present

## 2016-03-01 DIAGNOSIS — Z79899 Other long term (current) drug therapy: Secondary | ICD-10-CM | POA: Diagnosis not present

## 2016-03-01 DIAGNOSIS — I2510 Atherosclerotic heart disease of native coronary artery without angina pectoris: Secondary | ICD-10-CM | POA: Diagnosis not present

## 2016-03-01 DIAGNOSIS — Z7902 Long term (current) use of antithrombotics/antiplatelets: Secondary | ICD-10-CM | POA: Diagnosis not present

## 2016-03-01 DIAGNOSIS — N400 Enlarged prostate without lower urinary tract symptoms: Secondary | ICD-10-CM | POA: Diagnosis not present

## 2016-03-01 DIAGNOSIS — G309 Alzheimer's disease, unspecified: Secondary | ICD-10-CM | POA: Diagnosis not present

## 2016-03-03 DIAGNOSIS — Z7902 Long term (current) use of antithrombotics/antiplatelets: Secondary | ICD-10-CM | POA: Diagnosis not present

## 2016-03-03 DIAGNOSIS — I2510 Atherosclerotic heart disease of native coronary artery without angina pectoris: Secondary | ICD-10-CM | POA: Diagnosis not present

## 2016-03-03 DIAGNOSIS — I69398 Other sequelae of cerebral infarction: Secondary | ICD-10-CM | POA: Diagnosis not present

## 2016-03-03 DIAGNOSIS — M25561 Pain in right knee: Secondary | ICD-10-CM | POA: Diagnosis not present

## 2016-03-03 DIAGNOSIS — G309 Alzheimer's disease, unspecified: Secondary | ICD-10-CM | POA: Diagnosis not present

## 2016-03-03 DIAGNOSIS — M6281 Muscle weakness (generalized): Secondary | ICD-10-CM | POA: Diagnosis not present

## 2016-03-03 DIAGNOSIS — I739 Peripheral vascular disease, unspecified: Secondary | ICD-10-CM | POA: Diagnosis not present

## 2016-03-03 DIAGNOSIS — I10 Essential (primary) hypertension: Secondary | ICD-10-CM | POA: Diagnosis not present

## 2016-03-03 DIAGNOSIS — R2681 Unsteadiness on feet: Secondary | ICD-10-CM | POA: Diagnosis not present

## 2016-03-03 DIAGNOSIS — Z79899 Other long term (current) drug therapy: Secondary | ICD-10-CM | POA: Diagnosis not present

## 2016-03-03 DIAGNOSIS — N400 Enlarged prostate without lower urinary tract symptoms: Secondary | ICD-10-CM | POA: Diagnosis not present

## 2016-03-07 DIAGNOSIS — G309 Alzheimer's disease, unspecified: Secondary | ICD-10-CM | POA: Diagnosis not present

## 2016-03-07 DIAGNOSIS — M25561 Pain in right knee: Secondary | ICD-10-CM | POA: Diagnosis not present

## 2016-03-07 DIAGNOSIS — I739 Peripheral vascular disease, unspecified: Secondary | ICD-10-CM | POA: Diagnosis not present

## 2016-03-07 DIAGNOSIS — M6281 Muscle weakness (generalized): Secondary | ICD-10-CM | POA: Diagnosis not present

## 2016-03-07 DIAGNOSIS — I69398 Other sequelae of cerebral infarction: Secondary | ICD-10-CM | POA: Diagnosis not present

## 2016-03-07 DIAGNOSIS — I2510 Atherosclerotic heart disease of native coronary artery without angina pectoris: Secondary | ICD-10-CM | POA: Diagnosis not present

## 2016-03-07 DIAGNOSIS — Z7902 Long term (current) use of antithrombotics/antiplatelets: Secondary | ICD-10-CM | POA: Diagnosis not present

## 2016-03-07 DIAGNOSIS — N400 Enlarged prostate without lower urinary tract symptoms: Secondary | ICD-10-CM | POA: Diagnosis not present

## 2016-03-07 DIAGNOSIS — R2681 Unsteadiness on feet: Secondary | ICD-10-CM | POA: Diagnosis not present

## 2016-03-07 DIAGNOSIS — I10 Essential (primary) hypertension: Secondary | ICD-10-CM | POA: Diagnosis not present

## 2016-03-07 DIAGNOSIS — Z79899 Other long term (current) drug therapy: Secondary | ICD-10-CM | POA: Diagnosis not present

## 2016-03-08 DIAGNOSIS — Z7902 Long term (current) use of antithrombotics/antiplatelets: Secondary | ICD-10-CM | POA: Diagnosis not present

## 2016-03-08 DIAGNOSIS — I739 Peripheral vascular disease, unspecified: Secondary | ICD-10-CM | POA: Diagnosis not present

## 2016-03-08 DIAGNOSIS — I10 Essential (primary) hypertension: Secondary | ICD-10-CM | POA: Diagnosis not present

## 2016-03-08 DIAGNOSIS — M25561 Pain in right knee: Secondary | ICD-10-CM | POA: Diagnosis not present

## 2016-03-08 DIAGNOSIS — G309 Alzheimer's disease, unspecified: Secondary | ICD-10-CM | POA: Diagnosis not present

## 2016-03-08 DIAGNOSIS — M6281 Muscle weakness (generalized): Secondary | ICD-10-CM | POA: Diagnosis not present

## 2016-03-08 DIAGNOSIS — I69398 Other sequelae of cerebral infarction: Secondary | ICD-10-CM | POA: Diagnosis not present

## 2016-03-08 DIAGNOSIS — I2510 Atherosclerotic heart disease of native coronary artery without angina pectoris: Secondary | ICD-10-CM | POA: Diagnosis not present

## 2016-03-08 DIAGNOSIS — L853 Xerosis cutis: Secondary | ICD-10-CM | POA: Diagnosis not present

## 2016-03-08 DIAGNOSIS — Z79899 Other long term (current) drug therapy: Secondary | ICD-10-CM | POA: Diagnosis not present

## 2016-03-08 DIAGNOSIS — M2010 Hallux valgus (acquired), unspecified foot: Secondary | ICD-10-CM | POA: Diagnosis not present

## 2016-03-08 DIAGNOSIS — N400 Enlarged prostate without lower urinary tract symptoms: Secondary | ICD-10-CM | POA: Diagnosis not present

## 2016-03-08 DIAGNOSIS — R2681 Unsteadiness on feet: Secondary | ICD-10-CM | POA: Diagnosis not present

## 2016-03-10 DIAGNOSIS — M6281 Muscle weakness (generalized): Secondary | ICD-10-CM | POA: Diagnosis not present

## 2016-03-10 DIAGNOSIS — R2681 Unsteadiness on feet: Secondary | ICD-10-CM | POA: Diagnosis not present

## 2016-03-10 DIAGNOSIS — I2510 Atherosclerotic heart disease of native coronary artery without angina pectoris: Secondary | ICD-10-CM | POA: Diagnosis not present

## 2016-03-10 DIAGNOSIS — Z7902 Long term (current) use of antithrombotics/antiplatelets: Secondary | ICD-10-CM | POA: Diagnosis not present

## 2016-03-10 DIAGNOSIS — Z79899 Other long term (current) drug therapy: Secondary | ICD-10-CM | POA: Diagnosis not present

## 2016-03-10 DIAGNOSIS — N400 Enlarged prostate without lower urinary tract symptoms: Secondary | ICD-10-CM | POA: Diagnosis not present

## 2016-03-10 DIAGNOSIS — G309 Alzheimer's disease, unspecified: Secondary | ICD-10-CM | POA: Diagnosis not present

## 2016-03-10 DIAGNOSIS — I739 Peripheral vascular disease, unspecified: Secondary | ICD-10-CM | POA: Diagnosis not present

## 2016-03-10 DIAGNOSIS — M25561 Pain in right knee: Secondary | ICD-10-CM | POA: Diagnosis not present

## 2016-03-10 DIAGNOSIS — I10 Essential (primary) hypertension: Secondary | ICD-10-CM | POA: Diagnosis not present

## 2016-03-10 DIAGNOSIS — I69398 Other sequelae of cerebral infarction: Secondary | ICD-10-CM | POA: Diagnosis not present

## 2016-03-14 ENCOUNTER — Telehealth: Payer: Self-pay

## 2016-03-14 DIAGNOSIS — I10 Essential (primary) hypertension: Secondary | ICD-10-CM | POA: Diagnosis not present

## 2016-03-14 DIAGNOSIS — Z8673 Personal history of transient ischemic attack (TIA), and cerebral infarction without residual deficits: Secondary | ICD-10-CM | POA: Diagnosis not present

## 2016-03-14 DIAGNOSIS — I639 Cerebral infarction, unspecified: Secondary | ICD-10-CM | POA: Diagnosis not present

## 2016-03-14 DIAGNOSIS — I2510 Atherosclerotic heart disease of native coronary artery without angina pectoris: Secondary | ICD-10-CM | POA: Diagnosis not present

## 2016-03-14 DIAGNOSIS — N401 Enlarged prostate with lower urinary tract symptoms: Secondary | ICD-10-CM | POA: Diagnosis not present

## 2016-03-14 NOTE — Telephone Encounter (Signed)
Home Health Cert/Plan of Care received (01/12/2016 - 03/11/2016) already signed.  Faxed, copy sent to scan.

## 2016-03-16 ENCOUNTER — Ambulatory Visit: Payer: Medicare Other | Admitting: Neurology

## 2016-03-27 DIAGNOSIS — G309 Alzheimer's disease, unspecified: Secondary | ICD-10-CM | POA: Diagnosis not present

## 2016-03-27 DIAGNOSIS — G8929 Other chronic pain: Secondary | ICD-10-CM | POA: Diagnosis not present

## 2016-03-27 DIAGNOSIS — E559 Vitamin D deficiency, unspecified: Secondary | ICD-10-CM | POA: Diagnosis not present

## 2016-03-27 DIAGNOSIS — I739 Peripheral vascular disease, unspecified: Secondary | ICD-10-CM | POA: Diagnosis not present

## 2016-03-28 DIAGNOSIS — R319 Hematuria, unspecified: Secondary | ICD-10-CM | POA: Diagnosis not present

## 2016-04-04 DIAGNOSIS — N401 Enlarged prostate with lower urinary tract symptoms: Secondary | ICD-10-CM | POA: Diagnosis not present

## 2016-04-04 DIAGNOSIS — Z8673 Personal history of transient ischemic attack (TIA), and cerebral infarction without residual deficits: Secondary | ICD-10-CM | POA: Diagnosis not present

## 2016-04-04 DIAGNOSIS — I10 Essential (primary) hypertension: Secondary | ICD-10-CM | POA: Diagnosis not present

## 2016-04-04 DIAGNOSIS — I639 Cerebral infarction, unspecified: Secondary | ICD-10-CM | POA: Diagnosis not present

## 2016-04-04 DIAGNOSIS — I2510 Atherosclerotic heart disease of native coronary artery without angina pectoris: Secondary | ICD-10-CM | POA: Diagnosis not present

## 2016-04-11 DIAGNOSIS — M6281 Muscle weakness (generalized): Secondary | ICD-10-CM

## 2016-04-11 DIAGNOSIS — I69398 Other sequelae of cerebral infarction: Secondary | ICD-10-CM

## 2016-04-11 DIAGNOSIS — R2681 Unsteadiness on feet: Secondary | ICD-10-CM

## 2016-04-11 DIAGNOSIS — I739 Peripheral vascular disease, unspecified: Secondary | ICD-10-CM

## 2016-04-11 DIAGNOSIS — E7421 Galactosemia: Secondary | ICD-10-CM

## 2016-04-11 DIAGNOSIS — M25561 Pain in right knee: Secondary | ICD-10-CM

## 2016-04-11 DIAGNOSIS — I10 Essential (primary) hypertension: Secondary | ICD-10-CM

## 2016-04-12 DIAGNOSIS — G4700 Insomnia, unspecified: Secondary | ICD-10-CM | POA: Diagnosis not present

## 2016-04-12 DIAGNOSIS — G309 Alzheimer's disease, unspecified: Secondary | ICD-10-CM | POA: Diagnosis not present

## 2016-04-25 DIAGNOSIS — Z8673 Personal history of transient ischemic attack (TIA), and cerebral infarction without residual deficits: Secondary | ICD-10-CM | POA: Diagnosis not present

## 2016-04-25 DIAGNOSIS — I639 Cerebral infarction, unspecified: Secondary | ICD-10-CM | POA: Diagnosis not present

## 2016-04-25 DIAGNOSIS — G8929 Other chronic pain: Secondary | ICD-10-CM | POA: Diagnosis not present

## 2016-04-25 DIAGNOSIS — I2510 Atherosclerotic heart disease of native coronary artery without angina pectoris: Secondary | ICD-10-CM | POA: Diagnosis not present

## 2016-04-25 DIAGNOSIS — I10 Essential (primary) hypertension: Secondary | ICD-10-CM | POA: Diagnosis not present

## 2016-04-26 DIAGNOSIS — E559 Vitamin D deficiency, unspecified: Secondary | ICD-10-CM | POA: Diagnosis not present

## 2016-04-26 DIAGNOSIS — G8929 Other chronic pain: Secondary | ICD-10-CM | POA: Diagnosis not present

## 2016-04-26 DIAGNOSIS — G309 Alzheimer's disease, unspecified: Secondary | ICD-10-CM | POA: Diagnosis not present

## 2016-04-26 DIAGNOSIS — I739 Peripheral vascular disease, unspecified: Secondary | ICD-10-CM | POA: Diagnosis not present

## 2016-05-03 DIAGNOSIS — G4700 Insomnia, unspecified: Secondary | ICD-10-CM | POA: Diagnosis not present

## 2016-05-03 DIAGNOSIS — R600 Localized edema: Secondary | ICD-10-CM | POA: Diagnosis not present

## 2016-05-03 DIAGNOSIS — R5383 Other fatigue: Secondary | ICD-10-CM | POA: Diagnosis not present

## 2016-05-03 DIAGNOSIS — G309 Alzheimer's disease, unspecified: Secondary | ICD-10-CM | POA: Diagnosis not present

## 2016-05-09 DIAGNOSIS — M6281 Muscle weakness (generalized): Secondary | ICD-10-CM | POA: Diagnosis not present

## 2016-05-09 DIAGNOSIS — M25562 Pain in left knee: Secondary | ICD-10-CM | POA: Diagnosis not present

## 2016-05-09 DIAGNOSIS — M25561 Pain in right knee: Secondary | ICD-10-CM | POA: Diagnosis not present

## 2016-05-11 DIAGNOSIS — G309 Alzheimer's disease, unspecified: Secondary | ICD-10-CM | POA: Diagnosis not present

## 2016-05-11 DIAGNOSIS — Z9181 History of falling: Secondary | ICD-10-CM | POA: Diagnosis not present

## 2016-05-11 DIAGNOSIS — I2510 Atherosclerotic heart disease of native coronary artery without angina pectoris: Secondary | ICD-10-CM | POA: Diagnosis not present

## 2016-05-11 DIAGNOSIS — I739 Peripheral vascular disease, unspecified: Secondary | ICD-10-CM | POA: Diagnosis not present

## 2016-05-11 DIAGNOSIS — I119 Hypertensive heart disease without heart failure: Secondary | ICD-10-CM | POA: Diagnosis not present

## 2016-05-12 DIAGNOSIS — I739 Peripheral vascular disease, unspecified: Secondary | ICD-10-CM | POA: Diagnosis not present

## 2016-05-12 DIAGNOSIS — I119 Hypertensive heart disease without heart failure: Secondary | ICD-10-CM | POA: Diagnosis not present

## 2016-05-12 DIAGNOSIS — Z9181 History of falling: Secondary | ICD-10-CM | POA: Diagnosis not present

## 2016-05-12 DIAGNOSIS — G309 Alzheimer's disease, unspecified: Secondary | ICD-10-CM | POA: Diagnosis not present

## 2016-05-12 DIAGNOSIS — I2510 Atherosclerotic heart disease of native coronary artery without angina pectoris: Secondary | ICD-10-CM | POA: Diagnosis not present

## 2016-05-15 DIAGNOSIS — I2510 Atherosclerotic heart disease of native coronary artery without angina pectoris: Secondary | ICD-10-CM | POA: Diagnosis not present

## 2016-05-15 DIAGNOSIS — Z9181 History of falling: Secondary | ICD-10-CM | POA: Diagnosis not present

## 2016-05-15 DIAGNOSIS — I119 Hypertensive heart disease without heart failure: Secondary | ICD-10-CM | POA: Diagnosis not present

## 2016-05-15 DIAGNOSIS — I739 Peripheral vascular disease, unspecified: Secondary | ICD-10-CM | POA: Diagnosis not present

## 2016-05-15 DIAGNOSIS — G309 Alzheimer's disease, unspecified: Secondary | ICD-10-CM | POA: Diagnosis not present

## 2016-05-16 DIAGNOSIS — R600 Localized edema: Secondary | ICD-10-CM | POA: Diagnosis not present

## 2016-05-16 DIAGNOSIS — I119 Hypertensive heart disease without heart failure: Secondary | ICD-10-CM | POA: Diagnosis not present

## 2016-05-16 DIAGNOSIS — Z9181 History of falling: Secondary | ICD-10-CM | POA: Diagnosis not present

## 2016-05-16 DIAGNOSIS — I739 Peripheral vascular disease, unspecified: Secondary | ICD-10-CM | POA: Diagnosis not present

## 2016-05-16 DIAGNOSIS — M6281 Muscle weakness (generalized): Secondary | ICD-10-CM | POA: Diagnosis not present

## 2016-05-16 DIAGNOSIS — G309 Alzheimer's disease, unspecified: Secondary | ICD-10-CM | POA: Diagnosis not present

## 2016-05-16 DIAGNOSIS — I2510 Atherosclerotic heart disease of native coronary artery without angina pectoris: Secondary | ICD-10-CM | POA: Diagnosis not present

## 2016-05-17 DIAGNOSIS — I2510 Atherosclerotic heart disease of native coronary artery without angina pectoris: Secondary | ICD-10-CM | POA: Diagnosis not present

## 2016-05-17 DIAGNOSIS — I119 Hypertensive heart disease without heart failure: Secondary | ICD-10-CM | POA: Diagnosis not present

## 2016-05-17 DIAGNOSIS — I739 Peripheral vascular disease, unspecified: Secondary | ICD-10-CM | POA: Diagnosis not present

## 2016-05-17 DIAGNOSIS — Z9181 History of falling: Secondary | ICD-10-CM | POA: Diagnosis not present

## 2016-05-17 DIAGNOSIS — G309 Alzheimer's disease, unspecified: Secondary | ICD-10-CM | POA: Diagnosis not present

## 2016-05-22 DIAGNOSIS — G309 Alzheimer's disease, unspecified: Secondary | ICD-10-CM | POA: Diagnosis not present

## 2016-05-22 DIAGNOSIS — I119 Hypertensive heart disease without heart failure: Secondary | ICD-10-CM | POA: Diagnosis not present

## 2016-05-22 DIAGNOSIS — I739 Peripheral vascular disease, unspecified: Secondary | ICD-10-CM | POA: Diagnosis not present

## 2016-05-22 DIAGNOSIS — Z9181 History of falling: Secondary | ICD-10-CM | POA: Diagnosis not present

## 2016-05-22 DIAGNOSIS — I2510 Atherosclerotic heart disease of native coronary artery without angina pectoris: Secondary | ICD-10-CM | POA: Diagnosis not present

## 2016-05-23 DIAGNOSIS — G8929 Other chronic pain: Secondary | ICD-10-CM | POA: Diagnosis not present

## 2016-05-23 DIAGNOSIS — I2510 Atherosclerotic heart disease of native coronary artery without angina pectoris: Secondary | ICD-10-CM | POA: Diagnosis not present

## 2016-05-23 DIAGNOSIS — I639 Cerebral infarction, unspecified: Secondary | ICD-10-CM | POA: Diagnosis not present

## 2016-05-23 DIAGNOSIS — Z8673 Personal history of transient ischemic attack (TIA), and cerebral infarction without residual deficits: Secondary | ICD-10-CM | POA: Diagnosis not present

## 2016-05-23 DIAGNOSIS — R1030 Lower abdominal pain, unspecified: Secondary | ICD-10-CM | POA: Diagnosis not present

## 2016-05-24 DIAGNOSIS — I119 Hypertensive heart disease without heart failure: Secondary | ICD-10-CM | POA: Diagnosis not present

## 2016-05-24 DIAGNOSIS — I739 Peripheral vascular disease, unspecified: Secondary | ICD-10-CM | POA: Diagnosis not present

## 2016-05-24 DIAGNOSIS — Z9181 History of falling: Secondary | ICD-10-CM | POA: Diagnosis not present

## 2016-05-24 DIAGNOSIS — I2510 Atherosclerotic heart disease of native coronary artery without angina pectoris: Secondary | ICD-10-CM | POA: Diagnosis not present

## 2016-05-24 DIAGNOSIS — G309 Alzheimer's disease, unspecified: Secondary | ICD-10-CM | POA: Diagnosis not present

## 2016-05-25 DIAGNOSIS — E559 Vitamin D deficiency, unspecified: Secondary | ICD-10-CM | POA: Diagnosis not present

## 2016-05-25 DIAGNOSIS — I739 Peripheral vascular disease, unspecified: Secondary | ICD-10-CM | POA: Diagnosis not present

## 2016-05-25 DIAGNOSIS — G309 Alzheimer's disease, unspecified: Secondary | ICD-10-CM | POA: Diagnosis not present

## 2016-05-25 DIAGNOSIS — G8929 Other chronic pain: Secondary | ICD-10-CM | POA: Diagnosis not present

## 2016-05-25 DIAGNOSIS — G4700 Insomnia, unspecified: Secondary | ICD-10-CM | POA: Diagnosis not present

## 2016-05-26 DIAGNOSIS — E782 Mixed hyperlipidemia: Secondary | ICD-10-CM | POA: Diagnosis not present

## 2016-05-26 DIAGNOSIS — E039 Hypothyroidism, unspecified: Secondary | ICD-10-CM | POA: Diagnosis not present

## 2016-05-26 DIAGNOSIS — G309 Alzheimer's disease, unspecified: Secondary | ICD-10-CM | POA: Diagnosis not present

## 2016-05-26 DIAGNOSIS — R531 Weakness: Secondary | ICD-10-CM | POA: Diagnosis not present

## 2016-05-26 DIAGNOSIS — I10 Essential (primary) hypertension: Secondary | ICD-10-CM | POA: Diagnosis not present

## 2016-05-30 DIAGNOSIS — I119 Hypertensive heart disease without heart failure: Secondary | ICD-10-CM | POA: Diagnosis not present

## 2016-05-30 DIAGNOSIS — Z9181 History of falling: Secondary | ICD-10-CM | POA: Diagnosis not present

## 2016-05-30 DIAGNOSIS — G309 Alzheimer's disease, unspecified: Secondary | ICD-10-CM | POA: Diagnosis not present

## 2016-05-30 DIAGNOSIS — I739 Peripheral vascular disease, unspecified: Secondary | ICD-10-CM | POA: Diagnosis not present

## 2016-05-30 DIAGNOSIS — I2510 Atherosclerotic heart disease of native coronary artery without angina pectoris: Secondary | ICD-10-CM | POA: Diagnosis not present

## 2016-06-01 DIAGNOSIS — Z9181 History of falling: Secondary | ICD-10-CM | POA: Diagnosis not present

## 2016-06-01 DIAGNOSIS — I119 Hypertensive heart disease without heart failure: Secondary | ICD-10-CM | POA: Diagnosis not present

## 2016-06-01 DIAGNOSIS — G309 Alzheimer's disease, unspecified: Secondary | ICD-10-CM | POA: Diagnosis not present

## 2016-06-01 DIAGNOSIS — I2510 Atherosclerotic heart disease of native coronary artery without angina pectoris: Secondary | ICD-10-CM | POA: Diagnosis not present

## 2016-06-01 DIAGNOSIS — I739 Peripheral vascular disease, unspecified: Secondary | ICD-10-CM | POA: Diagnosis not present

## 2016-06-06 DIAGNOSIS — G309 Alzheimer's disease, unspecified: Secondary | ICD-10-CM | POA: Diagnosis not present

## 2016-06-06 DIAGNOSIS — I739 Peripheral vascular disease, unspecified: Secondary | ICD-10-CM | POA: Diagnosis not present

## 2016-06-06 DIAGNOSIS — I119 Hypertensive heart disease without heart failure: Secondary | ICD-10-CM | POA: Diagnosis not present

## 2016-06-06 DIAGNOSIS — Z9181 History of falling: Secondary | ICD-10-CM | POA: Diagnosis not present

## 2016-06-06 DIAGNOSIS — I2510 Atherosclerotic heart disease of native coronary artery without angina pectoris: Secondary | ICD-10-CM | POA: Diagnosis not present

## 2016-06-08 DIAGNOSIS — I2510 Atherosclerotic heart disease of native coronary artery without angina pectoris: Secondary | ICD-10-CM | POA: Diagnosis not present

## 2016-06-08 DIAGNOSIS — I119 Hypertensive heart disease without heart failure: Secondary | ICD-10-CM | POA: Diagnosis not present

## 2016-06-08 DIAGNOSIS — G309 Alzheimer's disease, unspecified: Secondary | ICD-10-CM | POA: Diagnosis not present

## 2016-06-08 DIAGNOSIS — Z9181 History of falling: Secondary | ICD-10-CM | POA: Diagnosis not present

## 2016-06-08 DIAGNOSIS — I739 Peripheral vascular disease, unspecified: Secondary | ICD-10-CM | POA: Diagnosis not present

## 2016-06-09 DIAGNOSIS — Z7409 Other reduced mobility: Secondary | ICD-10-CM | POA: Diagnosis not present

## 2016-06-13 DIAGNOSIS — I2510 Atherosclerotic heart disease of native coronary artery without angina pectoris: Secondary | ICD-10-CM | POA: Diagnosis not present

## 2016-06-13 DIAGNOSIS — I119 Hypertensive heart disease without heart failure: Secondary | ICD-10-CM | POA: Diagnosis not present

## 2016-06-13 DIAGNOSIS — I739 Peripheral vascular disease, unspecified: Secondary | ICD-10-CM | POA: Diagnosis not present

## 2016-06-13 DIAGNOSIS — G309 Alzheimer's disease, unspecified: Secondary | ICD-10-CM | POA: Diagnosis not present

## 2016-06-13 DIAGNOSIS — Z9181 History of falling: Secondary | ICD-10-CM | POA: Diagnosis not present

## 2016-06-15 DIAGNOSIS — I739 Peripheral vascular disease, unspecified: Secondary | ICD-10-CM | POA: Diagnosis not present

## 2016-06-15 DIAGNOSIS — I2510 Atherosclerotic heart disease of native coronary artery without angina pectoris: Secondary | ICD-10-CM | POA: Diagnosis not present

## 2016-06-15 DIAGNOSIS — Z9181 History of falling: Secondary | ICD-10-CM | POA: Diagnosis not present

## 2016-06-15 DIAGNOSIS — G309 Alzheimer's disease, unspecified: Secondary | ICD-10-CM | POA: Diagnosis not present

## 2016-06-15 DIAGNOSIS — I119 Hypertensive heart disease without heart failure: Secondary | ICD-10-CM | POA: Diagnosis not present

## 2016-06-20 DIAGNOSIS — Z9181 History of falling: Secondary | ICD-10-CM | POA: Diagnosis not present

## 2016-06-20 DIAGNOSIS — I2510 Atherosclerotic heart disease of native coronary artery without angina pectoris: Secondary | ICD-10-CM | POA: Diagnosis not present

## 2016-06-20 DIAGNOSIS — I639 Cerebral infarction, unspecified: Secondary | ICD-10-CM | POA: Diagnosis not present

## 2016-06-20 DIAGNOSIS — G8929 Other chronic pain: Secondary | ICD-10-CM | POA: Diagnosis not present

## 2016-06-20 DIAGNOSIS — I739 Peripheral vascular disease, unspecified: Secondary | ICD-10-CM | POA: Diagnosis not present

## 2016-06-20 DIAGNOSIS — I10 Essential (primary) hypertension: Secondary | ICD-10-CM | POA: Diagnosis not present

## 2016-06-20 DIAGNOSIS — I119 Hypertensive heart disease without heart failure: Secondary | ICD-10-CM | POA: Diagnosis not present

## 2016-06-20 DIAGNOSIS — R600 Localized edema: Secondary | ICD-10-CM | POA: Diagnosis not present

## 2016-06-20 DIAGNOSIS — G309 Alzheimer's disease, unspecified: Secondary | ICD-10-CM | POA: Diagnosis not present

## 2016-06-22 DIAGNOSIS — I119 Hypertensive heart disease without heart failure: Secondary | ICD-10-CM | POA: Diagnosis not present

## 2016-06-22 DIAGNOSIS — Z9181 History of falling: Secondary | ICD-10-CM | POA: Diagnosis not present

## 2016-06-22 DIAGNOSIS — I739 Peripheral vascular disease, unspecified: Secondary | ICD-10-CM | POA: Diagnosis not present

## 2016-06-22 DIAGNOSIS — G309 Alzheimer's disease, unspecified: Secondary | ICD-10-CM | POA: Diagnosis not present

## 2016-06-22 DIAGNOSIS — I2510 Atherosclerotic heart disease of native coronary artery without angina pectoris: Secondary | ICD-10-CM | POA: Diagnosis not present

## 2016-06-23 DIAGNOSIS — Z79899 Other long term (current) drug therapy: Secondary | ICD-10-CM | POA: Diagnosis not present

## 2016-06-26 DIAGNOSIS — G4700 Insomnia, unspecified: Secondary | ICD-10-CM | POA: Diagnosis not present

## 2016-06-26 DIAGNOSIS — G309 Alzheimer's disease, unspecified: Secondary | ICD-10-CM | POA: Diagnosis not present

## 2016-06-27 DIAGNOSIS — H1132 Conjunctival hemorrhage, left eye: Secondary | ICD-10-CM | POA: Diagnosis not present

## 2016-06-27 DIAGNOSIS — G309 Alzheimer's disease, unspecified: Secondary | ICD-10-CM | POA: Diagnosis not present

## 2016-06-27 DIAGNOSIS — E559 Vitamin D deficiency, unspecified: Secondary | ICD-10-CM | POA: Diagnosis not present

## 2016-06-27 DIAGNOSIS — I739 Peripheral vascular disease, unspecified: Secondary | ICD-10-CM | POA: Diagnosis not present

## 2016-06-27 DIAGNOSIS — I119 Hypertensive heart disease without heart failure: Secondary | ICD-10-CM | POA: Diagnosis not present

## 2016-06-27 DIAGNOSIS — G8929 Other chronic pain: Secondary | ICD-10-CM | POA: Diagnosis not present

## 2016-06-27 DIAGNOSIS — I2510 Atherosclerotic heart disease of native coronary artery without angina pectoris: Secondary | ICD-10-CM | POA: Diagnosis not present

## 2016-06-27 DIAGNOSIS — Z9181 History of falling: Secondary | ICD-10-CM | POA: Diagnosis not present

## 2016-06-29 DIAGNOSIS — I2510 Atherosclerotic heart disease of native coronary artery without angina pectoris: Secondary | ICD-10-CM | POA: Diagnosis not present

## 2016-06-29 DIAGNOSIS — G309 Alzheimer's disease, unspecified: Secondary | ICD-10-CM | POA: Diagnosis not present

## 2016-06-29 DIAGNOSIS — I119 Hypertensive heart disease without heart failure: Secondary | ICD-10-CM | POA: Diagnosis not present

## 2016-06-29 DIAGNOSIS — I739 Peripheral vascular disease, unspecified: Secondary | ICD-10-CM | POA: Diagnosis not present

## 2016-06-29 DIAGNOSIS — Z9181 History of falling: Secondary | ICD-10-CM | POA: Diagnosis not present

## 2016-07-04 DIAGNOSIS — G309 Alzheimer's disease, unspecified: Secondary | ICD-10-CM | POA: Diagnosis not present

## 2016-07-04 DIAGNOSIS — I2510 Atherosclerotic heart disease of native coronary artery without angina pectoris: Secondary | ICD-10-CM | POA: Diagnosis not present

## 2016-07-04 DIAGNOSIS — I739 Peripheral vascular disease, unspecified: Secondary | ICD-10-CM | POA: Diagnosis not present

## 2016-07-04 DIAGNOSIS — I119 Hypertensive heart disease without heart failure: Secondary | ICD-10-CM | POA: Diagnosis not present

## 2016-07-04 DIAGNOSIS — Z9181 History of falling: Secondary | ICD-10-CM | POA: Diagnosis not present

## 2016-07-06 DIAGNOSIS — G309 Alzheimer's disease, unspecified: Secondary | ICD-10-CM | POA: Diagnosis not present

## 2016-07-06 DIAGNOSIS — Z9181 History of falling: Secondary | ICD-10-CM | POA: Diagnosis not present

## 2016-07-06 DIAGNOSIS — I119 Hypertensive heart disease without heart failure: Secondary | ICD-10-CM | POA: Diagnosis not present

## 2016-07-06 DIAGNOSIS — I739 Peripheral vascular disease, unspecified: Secondary | ICD-10-CM | POA: Diagnosis not present

## 2016-07-06 DIAGNOSIS — I2510 Atherosclerotic heart disease of native coronary artery without angina pectoris: Secondary | ICD-10-CM | POA: Diagnosis not present

## 2016-07-10 DIAGNOSIS — Z7409 Other reduced mobility: Secondary | ICD-10-CM | POA: Diagnosis not present

## 2016-07-18 DIAGNOSIS — I639 Cerebral infarction, unspecified: Secondary | ICD-10-CM | POA: Diagnosis not present

## 2016-07-18 DIAGNOSIS — R600 Localized edema: Secondary | ICD-10-CM | POA: Diagnosis not present

## 2016-07-18 DIAGNOSIS — I10 Essential (primary) hypertension: Secondary | ICD-10-CM | POA: Diagnosis not present

## 2016-07-18 DIAGNOSIS — R05 Cough: Secondary | ICD-10-CM | POA: Diagnosis not present

## 2016-07-27 DIAGNOSIS — G309 Alzheimer's disease, unspecified: Secondary | ICD-10-CM | POA: Diagnosis not present

## 2016-07-27 DIAGNOSIS — I739 Peripheral vascular disease, unspecified: Secondary | ICD-10-CM | POA: Diagnosis not present

## 2016-07-27 DIAGNOSIS — E559 Vitamin D deficiency, unspecified: Secondary | ICD-10-CM | POA: Diagnosis not present

## 2016-07-27 DIAGNOSIS — G8929 Other chronic pain: Secondary | ICD-10-CM | POA: Diagnosis not present

## 2016-08-08 DIAGNOSIS — I739 Peripheral vascular disease, unspecified: Secondary | ICD-10-CM | POA: Diagnosis not present

## 2016-08-08 DIAGNOSIS — K219 Gastro-esophageal reflux disease without esophagitis: Secondary | ICD-10-CM | POA: Diagnosis not present

## 2016-08-08 DIAGNOSIS — G309 Alzheimer's disease, unspecified: Secondary | ICD-10-CM | POA: Diagnosis not present

## 2016-08-08 DIAGNOSIS — R600 Localized edema: Secondary | ICD-10-CM | POA: Diagnosis not present

## 2016-08-09 DIAGNOSIS — Z7409 Other reduced mobility: Secondary | ICD-10-CM | POA: Diagnosis not present

## 2016-08-15 DIAGNOSIS — G309 Alzheimer's disease, unspecified: Secondary | ICD-10-CM | POA: Diagnosis not present

## 2016-08-15 DIAGNOSIS — I10 Essential (primary) hypertension: Secondary | ICD-10-CM | POA: Diagnosis not present

## 2016-08-15 DIAGNOSIS — I639 Cerebral infarction, unspecified: Secondary | ICD-10-CM | POA: Diagnosis not present

## 2016-08-15 DIAGNOSIS — G8929 Other chronic pain: Secondary | ICD-10-CM | POA: Diagnosis not present

## 2016-08-15 DIAGNOSIS — R05 Cough: Secondary | ICD-10-CM | POA: Diagnosis not present

## 2016-08-15 DIAGNOSIS — Z8673 Personal history of transient ischemic attack (TIA), and cerebral infarction without residual deficits: Secondary | ICD-10-CM | POA: Diagnosis not present

## 2016-08-15 DIAGNOSIS — R600 Localized edema: Secondary | ICD-10-CM | POA: Diagnosis not present

## 2016-08-19 IMAGING — DX DG CHEST 2V
2 series · 2 of 2 positions shown · non-contrast
Comparison: 10/08/2011

CLINICAL DATA: Chest pain.  Question confusion.

EXAM:
CHEST  2 VIEW

[w chest pa (1 of 2)]
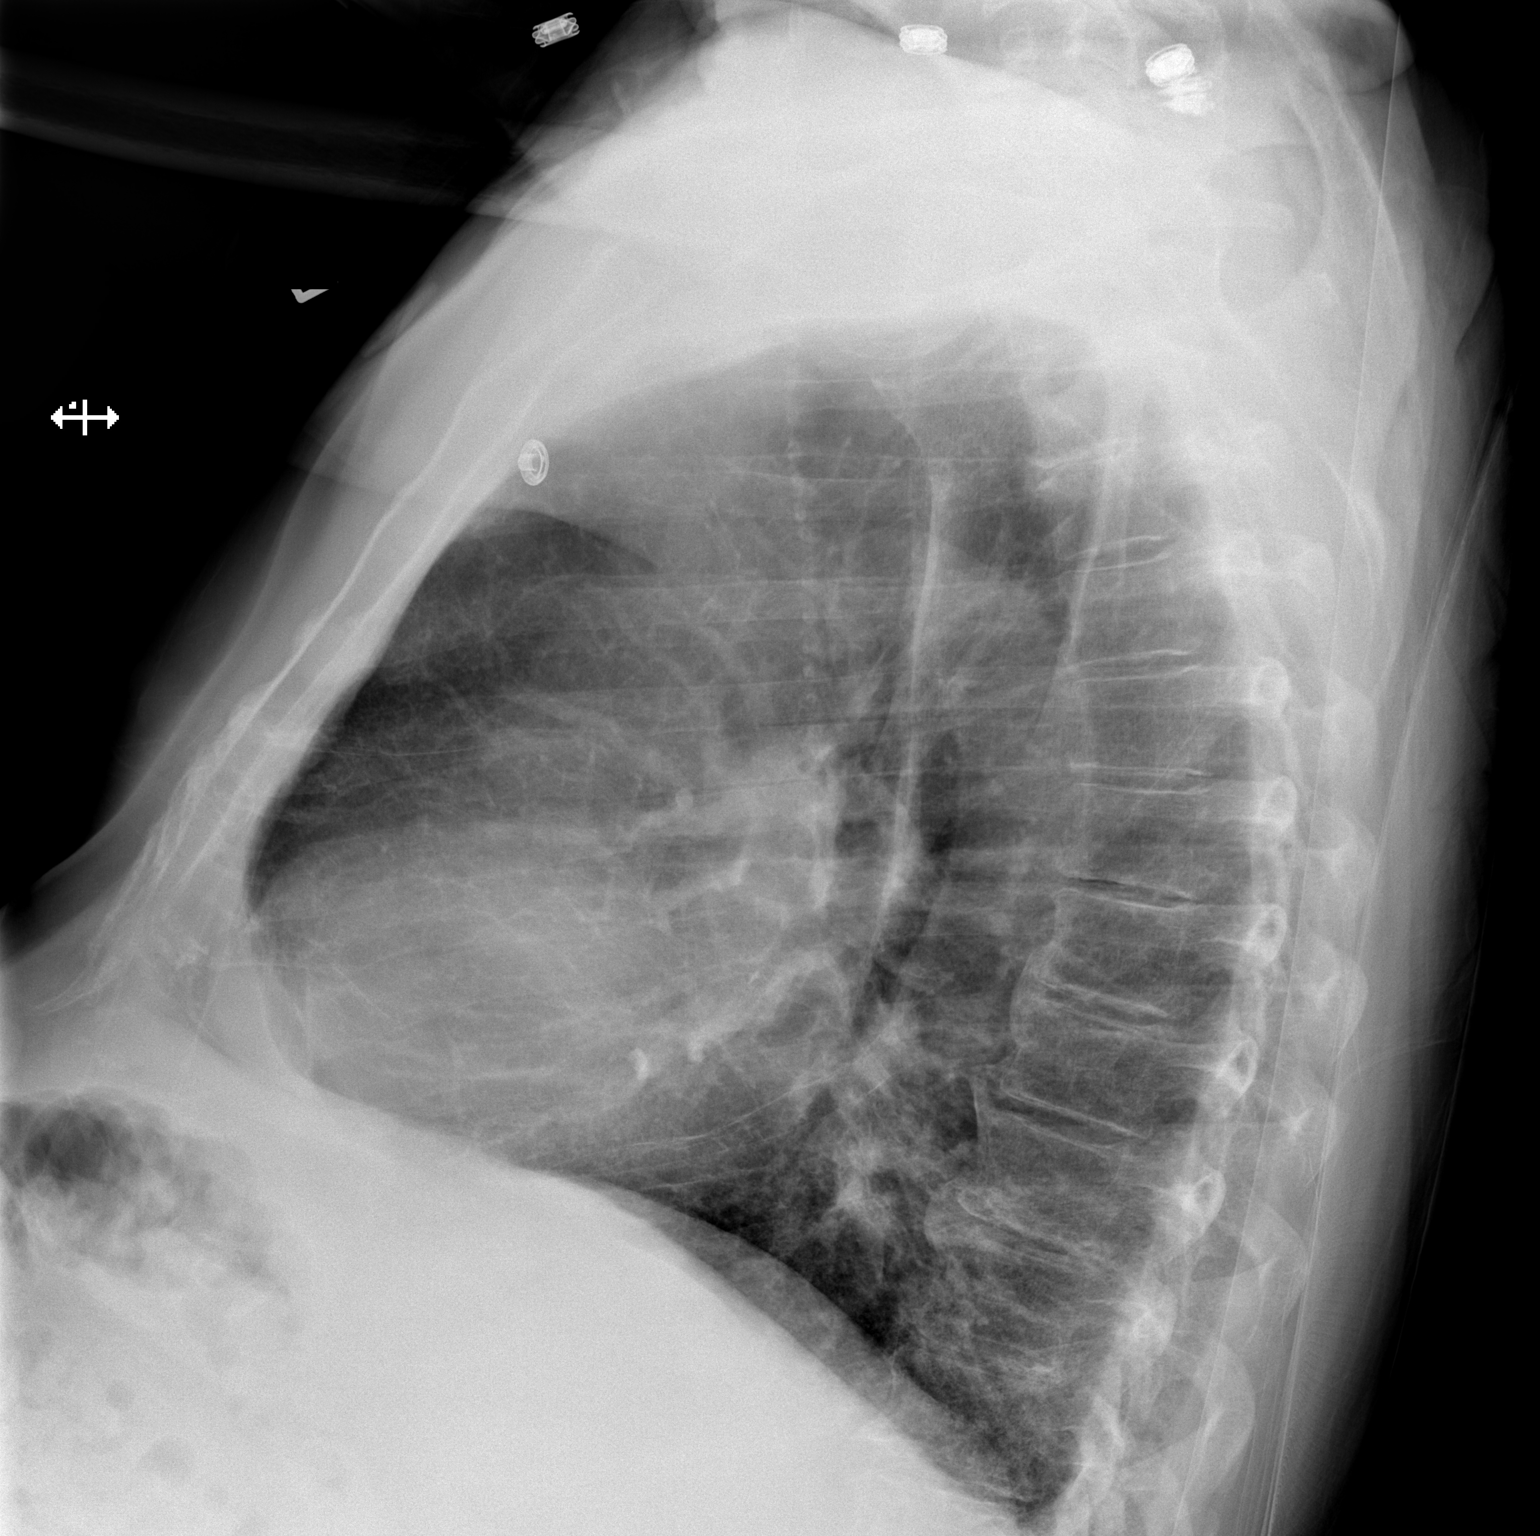

[w chest pa (2 of 2)]
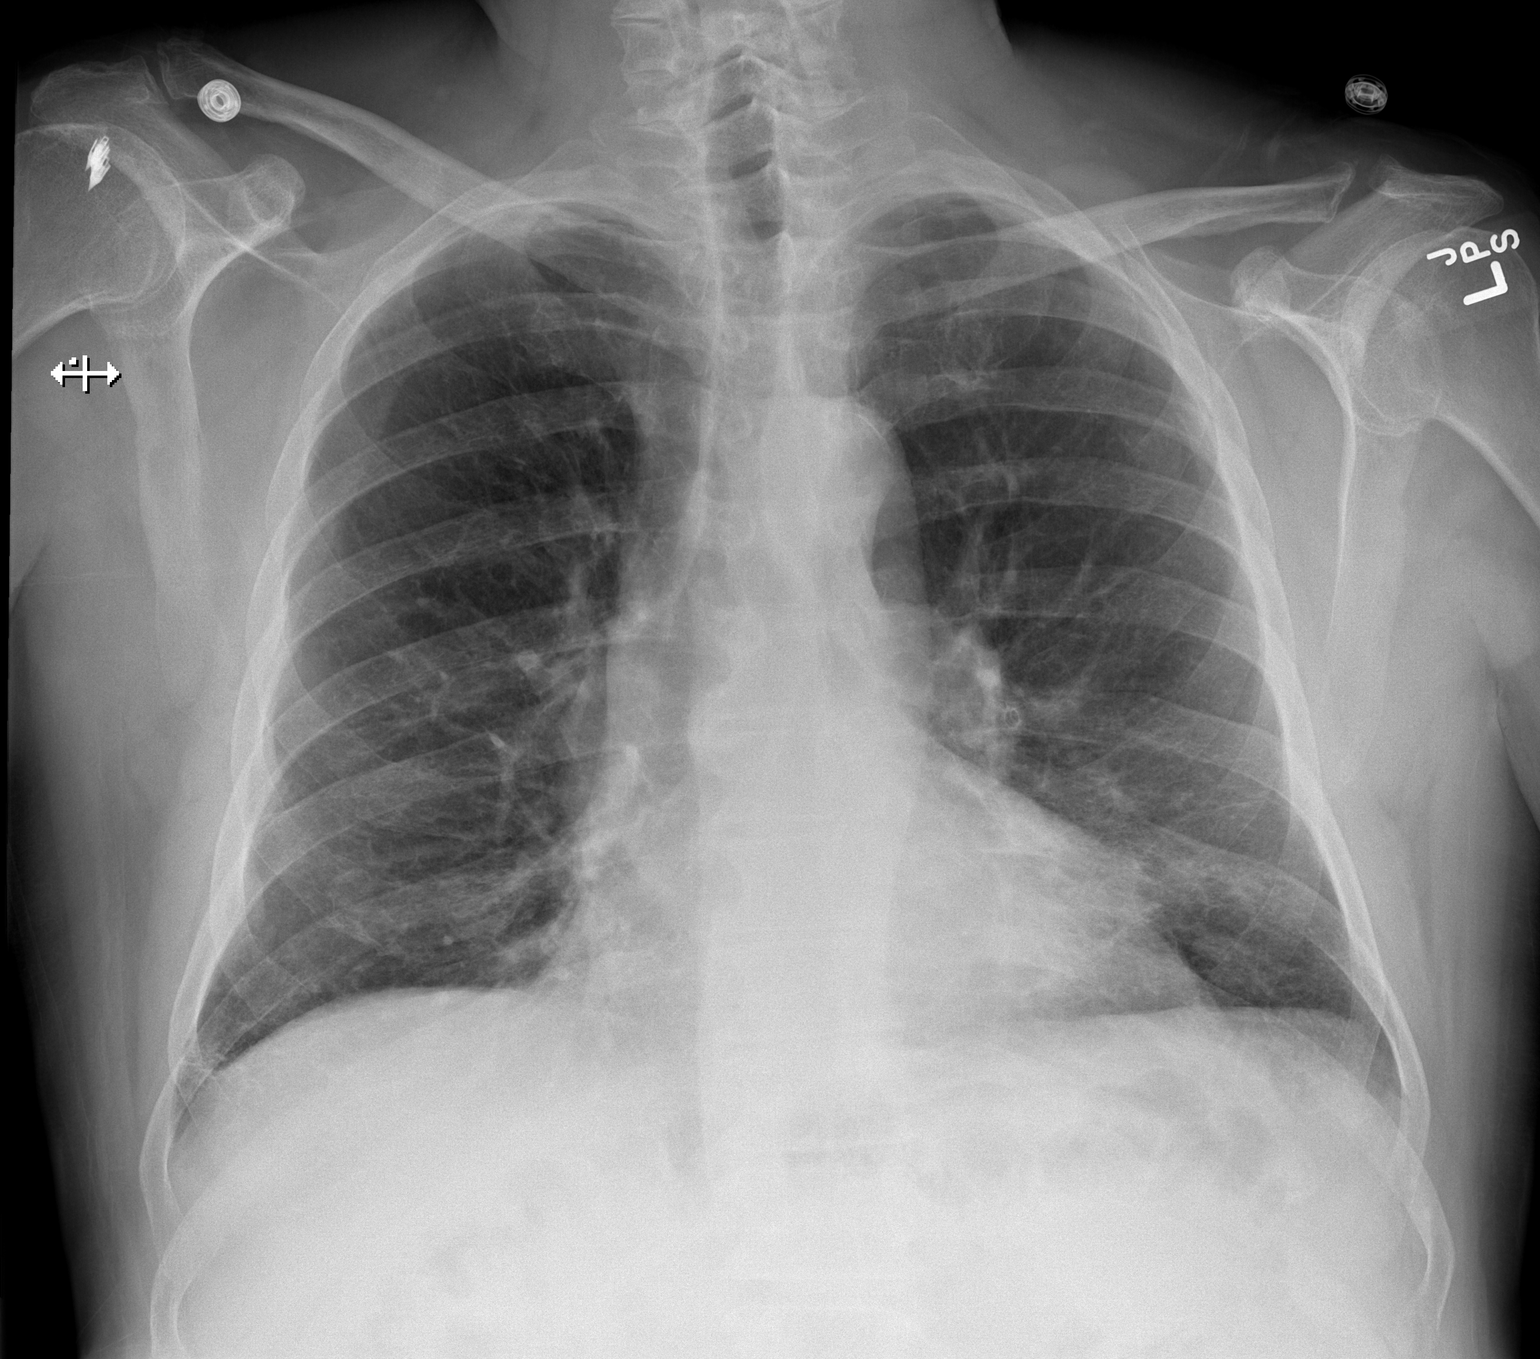

[2 of 2 positions shown; findings below may reference images not displayed]

FINDINGS: Subtle densities in the left lower lung are nonspecific but could
represent chronic changes or atelectasis. No significant airspace
disease. Heart and mediastinum are within normal limits. The trachea
is midline. Atherosclerotic calcifications at the aortic arch. No
large pleural effusions. Degenerative changes in thoracic spine.
IMPRESSION: Subtle left basilar densities could represent atelectasis or chronic
changes. No significant airspace disease.

## 2016-08-26 DIAGNOSIS — E559 Vitamin D deficiency, unspecified: Secondary | ICD-10-CM | POA: Diagnosis not present

## 2016-08-26 DIAGNOSIS — I739 Peripheral vascular disease, unspecified: Secondary | ICD-10-CM | POA: Diagnosis not present

## 2016-08-26 DIAGNOSIS — G309 Alzheimer's disease, unspecified: Secondary | ICD-10-CM | POA: Diagnosis not present

## 2016-08-26 DIAGNOSIS — G8929 Other chronic pain: Secondary | ICD-10-CM | POA: Diagnosis not present

## 2016-09-09 DIAGNOSIS — Z7409 Other reduced mobility: Secondary | ICD-10-CM | POA: Diagnosis not present

## 2016-09-12 DIAGNOSIS — Z0000 Encounter for general adult medical examination without abnormal findings: Secondary | ICD-10-CM | POA: Diagnosis not present

## 2016-09-15 DIAGNOSIS — G309 Alzheimer's disease, unspecified: Secondary | ICD-10-CM | POA: Diagnosis not present

## 2016-09-15 DIAGNOSIS — Z8673 Personal history of transient ischemic attack (TIA), and cerebral infarction without residual deficits: Secondary | ICD-10-CM | POA: Diagnosis not present

## 2016-09-16 DIAGNOSIS — R259 Unspecified abnormal involuntary movements: Secondary | ICD-10-CM | POA: Diagnosis not present

## 2016-09-16 DIAGNOSIS — Z888 Allergy status to other drugs, medicaments and biological substances status: Secondary | ICD-10-CM | POA: Diagnosis not present

## 2016-09-16 DIAGNOSIS — Z881 Allergy status to other antibiotic agents status: Secondary | ICD-10-CM | POA: Diagnosis not present

## 2016-09-16 DIAGNOSIS — Z79899 Other long term (current) drug therapy: Secondary | ICD-10-CM | POA: Diagnosis not present

## 2016-09-16 DIAGNOSIS — I2510 Atherosclerotic heart disease of native coronary artery without angina pectoris: Secondary | ICD-10-CM | POA: Diagnosis not present

## 2016-09-16 DIAGNOSIS — Z87891 Personal history of nicotine dependence: Secondary | ICD-10-CM | POA: Diagnosis not present

## 2016-09-16 DIAGNOSIS — I10 Essential (primary) hypertension: Secondary | ICD-10-CM | POA: Diagnosis not present

## 2016-09-16 DIAGNOSIS — Z7901 Long term (current) use of anticoagulants: Secondary | ICD-10-CM | POA: Diagnosis not present

## 2016-09-16 DIAGNOSIS — Z8673 Personal history of transient ischemic attack (TIA), and cerebral infarction without residual deficits: Secondary | ICD-10-CM | POA: Diagnosis not present

## 2016-09-16 DIAGNOSIS — S0003XA Contusion of scalp, initial encounter: Secondary | ICD-10-CM | POA: Diagnosis not present

## 2016-09-16 DIAGNOSIS — M50323 Other cervical disc degeneration at C6-C7 level: Secondary | ICD-10-CM | POA: Diagnosis not present

## 2016-09-16 DIAGNOSIS — Z885 Allergy status to narcotic agent status: Secondary | ICD-10-CM | POA: Diagnosis not present

## 2016-09-16 DIAGNOSIS — M47812 Spondylosis without myelopathy or radiculopathy, cervical region: Secondary | ICD-10-CM | POA: Diagnosis not present

## 2016-09-16 DIAGNOSIS — W050XXA Fall from non-moving wheelchair, initial encounter: Secondary | ICD-10-CM | POA: Diagnosis not present

## 2016-09-16 DIAGNOSIS — R51 Headache: Secondary | ICD-10-CM | POA: Diagnosis not present

## 2016-09-16 DIAGNOSIS — M50222 Other cervical disc displacement at C5-C6 level: Secondary | ICD-10-CM | POA: Diagnosis not present

## 2016-09-16 DIAGNOSIS — S0990XA Unspecified injury of head, initial encounter: Secondary | ICD-10-CM | POA: Diagnosis not present

## 2016-09-19 DIAGNOSIS — M6281 Muscle weakness (generalized): Secondary | ICD-10-CM | POA: Diagnosis not present

## 2016-09-19 DIAGNOSIS — W1839XA Other fall on same level, initial encounter: Secondary | ICD-10-CM | POA: Diagnosis not present

## 2016-09-22 DIAGNOSIS — E559 Vitamin D deficiency, unspecified: Secondary | ICD-10-CM | POA: Diagnosis not present

## 2016-09-22 DIAGNOSIS — I10 Essential (primary) hypertension: Secondary | ICD-10-CM | POA: Diagnosis not present

## 2016-09-22 DIAGNOSIS — Z79899 Other long term (current) drug therapy: Secondary | ICD-10-CM | POA: Diagnosis not present

## 2016-09-22 DIAGNOSIS — D518 Other vitamin B12 deficiency anemias: Secondary | ICD-10-CM | POA: Diagnosis not present

## 2016-09-22 DIAGNOSIS — E039 Hypothyroidism, unspecified: Secondary | ICD-10-CM | POA: Diagnosis not present

## 2016-09-26 DIAGNOSIS — E559 Vitamin D deficiency, unspecified: Secondary | ICD-10-CM | POA: Diagnosis not present

## 2016-09-26 DIAGNOSIS — I739 Peripheral vascular disease, unspecified: Secondary | ICD-10-CM | POA: Diagnosis not present

## 2016-09-26 DIAGNOSIS — G8929 Other chronic pain: Secondary | ICD-10-CM | POA: Diagnosis not present

## 2016-09-26 DIAGNOSIS — M79642 Pain in left hand: Secondary | ICD-10-CM | POA: Diagnosis not present

## 2016-09-26 DIAGNOSIS — G309 Alzheimer's disease, unspecified: Secondary | ICD-10-CM | POA: Diagnosis not present

## 2016-09-27 DIAGNOSIS — G309 Alzheimer's disease, unspecified: Secondary | ICD-10-CM | POA: Diagnosis not present

## 2016-09-27 DIAGNOSIS — Z8673 Personal history of transient ischemic attack (TIA), and cerebral infarction without residual deficits: Secondary | ICD-10-CM | POA: Diagnosis not present

## 2016-09-28 DIAGNOSIS — L853 Xerosis cutis: Secondary | ICD-10-CM | POA: Diagnosis not present

## 2016-09-28 DIAGNOSIS — M2010 Hallux valgus (acquired), unspecified foot: Secondary | ICD-10-CM | POA: Diagnosis not present

## 2016-09-28 DIAGNOSIS — L603 Nail dystrophy: Secondary | ICD-10-CM | POA: Diagnosis not present

## 2016-09-28 DIAGNOSIS — B351 Tinea unguium: Secondary | ICD-10-CM | POA: Diagnosis not present

## 2016-09-28 DIAGNOSIS — Q845 Enlarged and hypertrophic nails: Secondary | ICD-10-CM | POA: Diagnosis not present

## 2016-10-03 DIAGNOSIS — E559 Vitamin D deficiency, unspecified: Secondary | ICD-10-CM | POA: Diagnosis not present

## 2016-10-03 DIAGNOSIS — I482 Chronic atrial fibrillation: Secondary | ICD-10-CM | POA: Diagnosis not present

## 2016-10-03 DIAGNOSIS — K219 Gastro-esophageal reflux disease without esophagitis: Secondary | ICD-10-CM | POA: Diagnosis not present

## 2016-10-03 DIAGNOSIS — I10 Essential (primary) hypertension: Secondary | ICD-10-CM | POA: Diagnosis not present

## 2016-10-10 DIAGNOSIS — R05 Cough: Secondary | ICD-10-CM | POA: Diagnosis not present

## 2016-10-23 DIAGNOSIS — G8929 Other chronic pain: Secondary | ICD-10-CM | POA: Diagnosis not present

## 2016-10-23 DIAGNOSIS — G309 Alzheimer's disease, unspecified: Secondary | ICD-10-CM | POA: Diagnosis not present

## 2016-10-23 DIAGNOSIS — I739 Peripheral vascular disease, unspecified: Secondary | ICD-10-CM | POA: Diagnosis not present

## 2016-10-23 DIAGNOSIS — E559 Vitamin D deficiency, unspecified: Secondary | ICD-10-CM | POA: Diagnosis not present

## 2016-10-24 DIAGNOSIS — R531 Weakness: Secondary | ICD-10-CM | POA: Diagnosis not present

## 2016-10-25 DIAGNOSIS — G309 Alzheimer's disease, unspecified: Secondary | ICD-10-CM | POA: Diagnosis not present

## 2016-10-25 DIAGNOSIS — Z8673 Personal history of transient ischemic attack (TIA), and cerebral infarction without residual deficits: Secondary | ICD-10-CM | POA: Diagnosis not present

## 2016-11-10 DIAGNOSIS — I10 Essential (primary) hypertension: Secondary | ICD-10-CM | POA: Diagnosis not present

## 2016-11-10 DIAGNOSIS — K219 Gastro-esophageal reflux disease without esophagitis: Secondary | ICD-10-CM | POA: Diagnosis not present

## 2016-11-10 DIAGNOSIS — I639 Cerebral infarction, unspecified: Secondary | ICD-10-CM | POA: Diagnosis not present

## 2016-11-10 DIAGNOSIS — I2510 Atherosclerotic heart disease of native coronary artery without angina pectoris: Secondary | ICD-10-CM | POA: Diagnosis not present

## 2016-11-10 DIAGNOSIS — N401 Enlarged prostate with lower urinary tract symptoms: Secondary | ICD-10-CM | POA: Diagnosis not present

## 2016-11-17 DIAGNOSIS — E559 Vitamin D deficiency, unspecified: Secondary | ICD-10-CM | POA: Diagnosis not present

## 2016-11-17 DIAGNOSIS — E039 Hypothyroidism, unspecified: Secondary | ICD-10-CM | POA: Diagnosis not present

## 2016-11-17 DIAGNOSIS — E119 Type 2 diabetes mellitus without complications: Secondary | ICD-10-CM | POA: Diagnosis not present

## 2016-11-17 DIAGNOSIS — I10 Essential (primary) hypertension: Secondary | ICD-10-CM | POA: Diagnosis not present

## 2016-11-17 DIAGNOSIS — E785 Hyperlipidemia, unspecified: Secondary | ICD-10-CM | POA: Diagnosis not present

## 2016-11-17 DIAGNOSIS — D510 Vitamin B12 deficiency anemia due to intrinsic factor deficiency: Secondary | ICD-10-CM | POA: Diagnosis not present

## 2016-11-21 DIAGNOSIS — I739 Peripheral vascular disease, unspecified: Secondary | ICD-10-CM | POA: Diagnosis not present

## 2016-11-21 DIAGNOSIS — G8929 Other chronic pain: Secondary | ICD-10-CM | POA: Diagnosis not present

## 2016-11-21 DIAGNOSIS — E559 Vitamin D deficiency, unspecified: Secondary | ICD-10-CM | POA: Diagnosis not present

## 2016-11-21 DIAGNOSIS — G309 Alzheimer's disease, unspecified: Secondary | ICD-10-CM | POA: Diagnosis not present

## 2016-11-25 DIAGNOSIS — G309 Alzheimer's disease, unspecified: Secondary | ICD-10-CM | POA: Diagnosis not present

## 2016-11-25 DIAGNOSIS — Z8673 Personal history of transient ischemic attack (TIA), and cerebral infarction without residual deficits: Secondary | ICD-10-CM | POA: Diagnosis not present

## 2016-12-01 ENCOUNTER — Telehealth: Payer: Self-pay

## 2016-12-01 ENCOUNTER — Telehealth: Payer: Self-pay | Admitting: Internal Medicine

## 2016-12-01 NOTE — Telephone Encounter (Signed)
Death of Certificate ready to for pick up front desk

## 2016-12-01 NOTE — Telephone Encounter (Signed)
On 12/01/2016 I received a death certificate from [facility] (original). The patient is a patient of Doctor Plotnikov. The death certificate is for emtombment. The death certificate will be taken to [facility] this am for signature.  On Dec 30, 2016 I received the death certificate back from Doctor Plotnikov. I got the death certificate ready and called the funeral home to let them know the death certificate is ready for pickup.

## 2016-12-26 DEATH — deceased
